# Patient Record
Sex: Female | Born: 1970 | Race: Black or African American | Hispanic: No | Marital: Married | Smoking: Never smoker
Health system: Southern US, Community
[De-identification: ages and names within clinical notes are randomized; demographics above are authoritative.]

## PROBLEM LIST (undated history)

## (undated) DIAGNOSIS — J302 Other seasonal allergic rhinitis: Secondary | ICD-10-CM

## (undated) DIAGNOSIS — D649 Anemia, unspecified: Secondary | ICD-10-CM

## (undated) DIAGNOSIS — F32A Depression, unspecified: Secondary | ICD-10-CM

## (undated) DIAGNOSIS — H269 Unspecified cataract: Secondary | ICD-10-CM

## (undated) DIAGNOSIS — E079 Disorder of thyroid, unspecified: Secondary | ICD-10-CM

## (undated) DIAGNOSIS — E785 Hyperlipidemia, unspecified: Secondary | ICD-10-CM

## (undated) DIAGNOSIS — F419 Anxiety disorder, unspecified: Secondary | ICD-10-CM

## (undated) DIAGNOSIS — F329 Major depressive disorder, single episode, unspecified: Secondary | ICD-10-CM

## (undated) DIAGNOSIS — I517 Cardiomegaly: Secondary | ICD-10-CM

## (undated) DIAGNOSIS — M1990 Unspecified osteoarthritis, unspecified site: Secondary | ICD-10-CM

## (undated) DIAGNOSIS — I10 Essential (primary) hypertension: Secondary | ICD-10-CM

## (undated) HISTORY — DX: Anemia, unspecified: D64.9

## (undated) HISTORY — DX: Depression, unspecified: F32.A

## (undated) HISTORY — PX: KNEE SURGERY: SHX244

## (undated) HISTORY — PX: ABDOMINAL HYSTERECTOMY: SHX81

## (undated) HISTORY — DX: Unspecified osteoarthritis, unspecified site: M19.90

## (undated) HISTORY — PX: GASTRIC BYPASS: SHX52

## (undated) HISTORY — DX: Hyperlipidemia, unspecified: E78.5

## (undated) HISTORY — DX: Unspecified cataract: H26.9

## (undated) HISTORY — DX: Major depressive disorder, single episode, unspecified: F32.9

## (undated) HISTORY — PX: HERNIA REPAIR: SHX51

## (undated) HISTORY — DX: Other seasonal allergic rhinitis: J30.2

## (undated) HISTORY — DX: Anxiety disorder, unspecified: F41.9

---

## 1993-01-29 HISTORY — PX: DILATION AND CURETTAGE OF UTERUS: SHX78

## 1997-01-29 HISTORY — PX: GASTRIC RESTRICTION SURGERY: SHX653

## 1998-01-29 HISTORY — PX: GASTRIC BYPASS: SHX52

## 1998-01-29 HISTORY — PX: VENTRAL HERNIA REPAIR: SHX424

## 2006-10-05 ENCOUNTER — Emergency Department (HOSPITAL_COMMUNITY): Admission: EM | Admit: 2006-10-05 | Discharge: 2006-10-05 | Payer: Self-pay | Admitting: Emergency Medicine

## 2006-11-26 ENCOUNTER — Emergency Department (HOSPITAL_COMMUNITY): Admission: EM | Admit: 2006-11-26 | Discharge: 2006-11-27 | Payer: Self-pay | Admitting: Emergency Medicine

## 2006-12-05 ENCOUNTER — Encounter (HOSPITAL_COMMUNITY): Admission: RE | Admit: 2006-12-05 | Discharge: 2007-01-04 | Payer: Self-pay | Admitting: Family Medicine

## 2007-01-01 ENCOUNTER — Ambulatory Visit (HOSPITAL_COMMUNITY): Admission: RE | Admit: 2007-01-01 | Discharge: 2007-01-01 | Payer: Self-pay | Admitting: Family Medicine

## 2007-01-01 ENCOUNTER — Encounter: Payer: Self-pay | Admitting: Orthopedic Surgery

## 2007-03-10 ENCOUNTER — Encounter (INDEPENDENT_AMBULATORY_CARE_PROVIDER_SITE_OTHER): Payer: Self-pay | Admitting: *Deleted

## 2007-03-10 ENCOUNTER — Ambulatory Visit (HOSPITAL_COMMUNITY): Admission: RE | Admit: 2007-03-10 | Discharge: 2007-03-10 | Payer: Self-pay | Admitting: Family Medicine

## 2007-08-19 ENCOUNTER — Ambulatory Visit: Payer: Self-pay | Admitting: Orthopedic Surgery

## 2007-08-19 DIAGNOSIS — M722 Plantar fascial fibromatosis: Secondary | ICD-10-CM | POA: Insufficient documentation

## 2007-09-08 ENCOUNTER — Telehealth: Payer: Self-pay | Admitting: Orthopedic Surgery

## 2007-09-10 ENCOUNTER — Ambulatory Visit: Payer: Self-pay | Admitting: Orthopedic Surgery

## 2007-10-14 ENCOUNTER — Telehealth: Payer: Self-pay | Admitting: Orthopedic Surgery

## 2007-12-10 ENCOUNTER — Ambulatory Visit: Payer: Self-pay | Admitting: Orthopedic Surgery

## 2007-12-10 DIAGNOSIS — M25569 Pain in unspecified knee: Secondary | ICD-10-CM | POA: Insufficient documentation

## 2008-01-12 ENCOUNTER — Ambulatory Visit: Payer: Self-pay | Admitting: Orthopedic Surgery

## 2008-01-12 DIAGNOSIS — M25469 Effusion, unspecified knee: Secondary | ICD-10-CM | POA: Insufficient documentation

## 2008-01-20 ENCOUNTER — Telehealth: Payer: Self-pay | Admitting: Orthopedic Surgery

## 2008-01-26 ENCOUNTER — Ambulatory Visit (HOSPITAL_COMMUNITY): Admission: RE | Admit: 2008-01-26 | Discharge: 2008-01-26 | Payer: Self-pay | Admitting: Orthopedic Surgery

## 2008-01-30 HISTORY — PX: KNEE SURGERY: SHX244

## 2008-03-31 ENCOUNTER — Ambulatory Visit: Payer: Self-pay | Admitting: Orthopedic Surgery

## 2008-03-31 ENCOUNTER — Emergency Department (HOSPITAL_COMMUNITY): Admission: EM | Admit: 2008-03-31 | Discharge: 2008-03-31 | Payer: Self-pay | Admitting: Emergency Medicine

## 2008-03-31 DIAGNOSIS — M545 Low back pain, unspecified: Secondary | ICD-10-CM | POA: Insufficient documentation

## 2008-03-31 DIAGNOSIS — M5126 Other intervertebral disc displacement, lumbar region: Secondary | ICD-10-CM | POA: Insufficient documentation

## 2008-04-02 ENCOUNTER — Telehealth: Payer: Self-pay | Admitting: Orthopedic Surgery

## 2008-04-02 ENCOUNTER — Ambulatory Visit (HOSPITAL_COMMUNITY): Admission: RE | Admit: 2008-04-02 | Discharge: 2008-04-02 | Payer: Self-pay | Admitting: Orthopedic Surgery

## 2008-04-05 ENCOUNTER — Encounter: Payer: Self-pay | Admitting: Orthopedic Surgery

## 2008-04-07 ENCOUNTER — Ambulatory Visit: Payer: Self-pay | Admitting: Orthopedic Surgery

## 2008-04-15 ENCOUNTER — Telehealth: Payer: Self-pay | Admitting: Orthopedic Surgery

## 2008-04-16 ENCOUNTER — Encounter (INDEPENDENT_AMBULATORY_CARE_PROVIDER_SITE_OTHER): Payer: Self-pay | Admitting: *Deleted

## 2008-04-19 ENCOUNTER — Encounter: Admission: RE | Admit: 2008-04-19 | Discharge: 2008-04-19 | Payer: Self-pay | Admitting: Orthopedic Surgery

## 2008-04-30 ENCOUNTER — Encounter: Payer: Self-pay | Admitting: Orthopedic Surgery

## 2008-04-30 ENCOUNTER — Emergency Department (HOSPITAL_COMMUNITY): Admission: EM | Admit: 2008-04-30 | Discharge: 2008-04-30 | Payer: Self-pay | Admitting: Emergency Medicine

## 2008-05-01 ENCOUNTER — Encounter: Payer: Self-pay | Admitting: Orthopedic Surgery

## 2008-05-03 ENCOUNTER — Ambulatory Visit: Payer: Self-pay | Admitting: Orthopedic Surgery

## 2008-05-03 DIAGNOSIS — S83419A Sprain of medial collateral ligament of unspecified knee, initial encounter: Secondary | ICD-10-CM | POA: Insufficient documentation

## 2008-05-04 ENCOUNTER — Encounter: Admission: RE | Admit: 2008-05-04 | Discharge: 2008-05-04 | Payer: Self-pay | Admitting: Orthopedic Surgery

## 2008-05-04 ENCOUNTER — Telehealth: Payer: Self-pay | Admitting: Orthopedic Surgery

## 2008-05-26 ENCOUNTER — Encounter: Admission: RE | Admit: 2008-05-26 | Discharge: 2008-05-26 | Payer: Self-pay | Admitting: Internal Medicine

## 2008-05-31 ENCOUNTER — Ambulatory Visit: Payer: Self-pay | Admitting: Orthopedic Surgery

## 2008-07-23 ENCOUNTER — Encounter: Payer: Self-pay | Admitting: Orthopedic Surgery

## 2008-07-26 ENCOUNTER — Telehealth: Payer: Self-pay | Admitting: Orthopedic Surgery

## 2008-07-29 ENCOUNTER — Encounter (INDEPENDENT_AMBULATORY_CARE_PROVIDER_SITE_OTHER): Payer: Self-pay | Admitting: *Deleted

## 2008-07-29 ENCOUNTER — Ambulatory Visit: Payer: Self-pay | Admitting: Orthopedic Surgery

## 2008-07-29 DIAGNOSIS — M23349 Other meniscus derangements, anterior horn of lateral meniscus, unspecified knee: Secondary | ICD-10-CM | POA: Insufficient documentation

## 2008-08-04 ENCOUNTER — Encounter (INDEPENDENT_AMBULATORY_CARE_PROVIDER_SITE_OTHER): Payer: Self-pay | Admitting: *Deleted

## 2008-08-19 ENCOUNTER — Telehealth: Payer: Self-pay | Admitting: Orthopedic Surgery

## 2008-09-21 ENCOUNTER — Telehealth: Payer: Self-pay | Admitting: Orthopedic Surgery

## 2008-09-30 ENCOUNTER — Ambulatory Visit: Payer: Self-pay | Admitting: Orthopedic Surgery

## 2008-09-30 DIAGNOSIS — G5600 Carpal tunnel syndrome, unspecified upper limb: Secondary | ICD-10-CM | POA: Insufficient documentation

## 2008-10-01 ENCOUNTER — Encounter (INDEPENDENT_AMBULATORY_CARE_PROVIDER_SITE_OTHER): Payer: Self-pay | Admitting: *Deleted

## 2008-10-20 ENCOUNTER — Encounter: Admission: RE | Admit: 2008-10-20 | Discharge: 2008-10-20 | Payer: Self-pay | Admitting: Orthopedic Surgery

## 2008-11-26 ENCOUNTER — Encounter: Admission: RE | Admit: 2008-11-26 | Discharge: 2008-11-26 | Payer: Self-pay | Admitting: Orthopedic Surgery

## 2008-12-01 ENCOUNTER — Ambulatory Visit: Payer: Self-pay | Admitting: Orthopedic Surgery

## 2008-12-01 DIAGNOSIS — S83259A Bucket-handle tear of lateral meniscus, current injury, unspecified knee, initial encounter: Secondary | ICD-10-CM | POA: Insufficient documentation

## 2008-12-01 DIAGNOSIS — S83289A Other tear of lateral meniscus, current injury, unspecified knee, initial encounter: Secondary | ICD-10-CM | POA: Insufficient documentation

## 2008-12-07 ENCOUNTER — Encounter: Payer: Self-pay | Admitting: Orthopedic Surgery

## 2008-12-20 ENCOUNTER — Encounter: Payer: Self-pay | Admitting: Orthopedic Surgery

## 2008-12-20 ENCOUNTER — Telehealth: Payer: Self-pay | Admitting: Orthopedic Surgery

## 2008-12-21 ENCOUNTER — Ambulatory Visit: Payer: Self-pay | Admitting: Orthopedic Surgery

## 2008-12-21 ENCOUNTER — Ambulatory Visit (HOSPITAL_COMMUNITY): Admission: RE | Admit: 2008-12-21 | Discharge: 2008-12-21 | Payer: Self-pay | Admitting: Orthopedic Surgery

## 2008-12-28 ENCOUNTER — Ambulatory Visit: Payer: Self-pay | Admitting: Orthopedic Surgery

## 2009-01-03 ENCOUNTER — Ambulatory Visit: Payer: Self-pay | Admitting: Orthopedic Surgery

## 2009-01-16 ENCOUNTER — Emergency Department (HOSPITAL_COMMUNITY): Admission: EM | Admit: 2009-01-16 | Discharge: 2009-01-16 | Payer: Self-pay | Admitting: Emergency Medicine

## 2009-01-16 ENCOUNTER — Encounter: Payer: Self-pay | Admitting: Orthopedic Surgery

## 2009-01-17 ENCOUNTER — Ambulatory Visit: Payer: Self-pay | Admitting: Orthopedic Surgery

## 2009-01-17 DIAGNOSIS — S92109A Unspecified fracture of unspecified talus, initial encounter for closed fracture: Secondary | ICD-10-CM | POA: Insufficient documentation

## 2009-01-25 ENCOUNTER — Encounter: Admission: RE | Admit: 2009-01-25 | Discharge: 2009-01-25 | Payer: Self-pay | Admitting: Orthopedic Surgery

## 2009-03-20 ENCOUNTER — Emergency Department (HOSPITAL_COMMUNITY): Admission: EM | Admit: 2009-03-20 | Discharge: 2009-03-20 | Payer: Self-pay | Admitting: Emergency Medicine

## 2009-04-15 ENCOUNTER — Encounter: Payer: Self-pay | Admitting: Orthopedic Surgery

## 2009-04-25 ENCOUNTER — Telehealth: Payer: Self-pay | Admitting: Orthopedic Surgery

## 2009-04-26 ENCOUNTER — Telehealth: Payer: Self-pay | Admitting: Orthopedic Surgery

## 2009-04-27 ENCOUNTER — Telehealth: Payer: Self-pay | Admitting: Orthopedic Surgery

## 2009-04-28 ENCOUNTER — Encounter: Admission: RE | Admit: 2009-04-28 | Discharge: 2009-04-28 | Payer: Self-pay | Admitting: Orthopedic Surgery

## 2009-06-23 ENCOUNTER — Telehealth: Payer: Self-pay | Admitting: Orthopedic Surgery

## 2009-08-09 ENCOUNTER — Encounter: Admission: RE | Admit: 2009-08-09 | Discharge: 2009-08-09 | Payer: Self-pay | Admitting: Orthopedic Surgery

## 2009-09-23 ENCOUNTER — Encounter: Admission: RE | Admit: 2009-09-23 | Discharge: 2009-09-23 | Payer: Self-pay | Admitting: Orthopedic Surgery

## 2009-10-28 ENCOUNTER — Telehealth: Payer: Self-pay | Admitting: Orthopedic Surgery

## 2010-01-03 ENCOUNTER — Emergency Department (HOSPITAL_COMMUNITY)
Admission: EM | Admit: 2010-01-03 | Discharge: 2010-01-04 | Payer: Self-pay | Source: Home / Self Care | Admitting: Emergency Medicine

## 2010-01-06 ENCOUNTER — Encounter: Payer: Self-pay | Admitting: Orthopedic Surgery

## 2010-01-19 ENCOUNTER — Emergency Department (HOSPITAL_COMMUNITY)
Admission: EM | Admit: 2010-01-19 | Discharge: 2010-01-19 | Payer: Self-pay | Source: Home / Self Care | Admitting: Emergency Medicine

## 2010-01-19 ENCOUNTER — Encounter: Payer: Self-pay | Admitting: Orthopedic Surgery

## 2010-01-25 ENCOUNTER — Encounter: Payer: Self-pay | Admitting: Orthopedic Surgery

## 2010-01-29 HISTORY — PX: BREAST BIOPSY: SHX20

## 2010-01-29 HISTORY — PX: KNEE SURGERY: SHX244

## 2010-02-01 ENCOUNTER — Encounter: Payer: Self-pay | Admitting: Gastroenterology

## 2010-02-01 ENCOUNTER — Encounter (INDEPENDENT_AMBULATORY_CARE_PROVIDER_SITE_OTHER): Payer: Self-pay | Admitting: *Deleted

## 2010-02-02 DIAGNOSIS — K5900 Constipation, unspecified: Secondary | ICD-10-CM | POA: Insufficient documentation

## 2010-02-02 DIAGNOSIS — E559 Vitamin D deficiency, unspecified: Secondary | ICD-10-CM | POA: Insufficient documentation

## 2010-02-02 DIAGNOSIS — F3289 Other specified depressive episodes: Secondary | ICD-10-CM | POA: Insufficient documentation

## 2010-02-02 DIAGNOSIS — K5909 Other constipation: Secondary | ICD-10-CM | POA: Insufficient documentation

## 2010-02-02 DIAGNOSIS — K219 Gastro-esophageal reflux disease without esophagitis: Secondary | ICD-10-CM | POA: Insufficient documentation

## 2010-02-02 DIAGNOSIS — G894 Chronic pain syndrome: Secondary | ICD-10-CM | POA: Insufficient documentation

## 2010-02-02 DIAGNOSIS — A4902 Methicillin resistant Staphylococcus aureus infection, unspecified site: Secondary | ICD-10-CM | POA: Insufficient documentation

## 2010-02-02 DIAGNOSIS — F329 Major depressive disorder, single episode, unspecified: Secondary | ICD-10-CM | POA: Insufficient documentation

## 2010-02-02 DIAGNOSIS — E669 Obesity, unspecified: Secondary | ICD-10-CM | POA: Insufficient documentation

## 2010-02-02 DIAGNOSIS — E119 Type 2 diabetes mellitus without complications: Secondary | ICD-10-CM | POA: Insufficient documentation

## 2010-02-02 DIAGNOSIS — E039 Hypothyroidism, unspecified: Secondary | ICD-10-CM | POA: Insufficient documentation

## 2010-02-07 ENCOUNTER — Other Ambulatory Visit: Payer: Self-pay | Admitting: Gastroenterology

## 2010-02-07 ENCOUNTER — Encounter: Payer: Self-pay | Admitting: Orthopedic Surgery

## 2010-02-07 ENCOUNTER — Encounter: Payer: Self-pay | Admitting: Gastroenterology

## 2010-02-07 ENCOUNTER — Ambulatory Visit
Admission: RE | Admit: 2010-02-07 | Discharge: 2010-02-07 | Payer: Self-pay | Source: Home / Self Care | Attending: Orthopedic Surgery | Admitting: Orthopedic Surgery

## 2010-02-07 ENCOUNTER — Ambulatory Visit
Admission: RE | Admit: 2010-02-07 | Discharge: 2010-02-07 | Payer: Self-pay | Source: Home / Self Care | Attending: Gastroenterology | Admitting: Gastroenterology

## 2010-02-07 DIAGNOSIS — IMO0002 Reserved for concepts with insufficient information to code with codable children: Secondary | ICD-10-CM | POA: Insufficient documentation

## 2010-02-07 DIAGNOSIS — K5900 Constipation, unspecified: Secondary | ICD-10-CM | POA: Insufficient documentation

## 2010-02-07 DIAGNOSIS — R51 Headache: Secondary | ICD-10-CM | POA: Insufficient documentation

## 2010-02-07 DIAGNOSIS — D649 Anemia, unspecified: Secondary | ICD-10-CM | POA: Insufficient documentation

## 2010-02-07 DIAGNOSIS — K3189 Other diseases of stomach and duodenum: Secondary | ICD-10-CM | POA: Insufficient documentation

## 2010-02-07 DIAGNOSIS — K625 Hemorrhage of anus and rectum: Secondary | ICD-10-CM | POA: Insufficient documentation

## 2010-02-07 DIAGNOSIS — M1710 Unilateral primary osteoarthritis, unspecified knee: Secondary | ICD-10-CM | POA: Insufficient documentation

## 2010-02-07 DIAGNOSIS — K9589 Other complications of other bariatric procedure: Secondary | ICD-10-CM | POA: Insufficient documentation

## 2010-02-07 DIAGNOSIS — M129 Arthropathy, unspecified: Secondary | ICD-10-CM | POA: Insufficient documentation

## 2010-02-07 DIAGNOSIS — R1013 Epigastric pain: Secondary | ICD-10-CM

## 2010-02-07 DIAGNOSIS — R519 Headache, unspecified: Secondary | ICD-10-CM | POA: Insufficient documentation

## 2010-02-07 DIAGNOSIS — IMO0001 Reserved for inherently not codable concepts without codable children: Secondary | ICD-10-CM | POA: Insufficient documentation

## 2010-02-07 DIAGNOSIS — R1012 Left upper quadrant pain: Secondary | ICD-10-CM | POA: Insufficient documentation

## 2010-02-07 DIAGNOSIS — R112 Nausea with vomiting, unspecified: Secondary | ICD-10-CM | POA: Insufficient documentation

## 2010-02-07 LAB — HEPATIC FUNCTION PANEL
ALT: 13 U/L (ref 0–35)
AST: 20 U/L (ref 0–37)
Albumin: 3.5 g/dL (ref 3.5–5.2)
Alkaline Phosphatase: 77 U/L (ref 39–117)
Bilirubin, Direct: 0.1 mg/dL (ref 0.0–0.3)
Total Bilirubin: 0.3 mg/dL (ref 0.3–1.2)
Total Protein: 7.4 g/dL (ref 6.0–8.3)

## 2010-02-07 LAB — BASIC METABOLIC PANEL
BUN: 10 mg/dL (ref 6–23)
CO2: 29 mEq/L (ref 19–32)
Calcium: 9.2 mg/dL (ref 8.4–10.5)
Chloride: 102 mEq/L (ref 96–112)
Creatinine, Ser: 0.8 mg/dL (ref 0.4–1.2)
GFR: 102.4 mL/min (ref >60.00)
Glucose, Bld: 128 mg/dL — ABNORMAL HIGH (ref 70–99)
Potassium: 4.5 mEq/L (ref 3.5–5.1)
Sodium: 140 mEq/L (ref 135–145)

## 2010-02-07 LAB — VITAMIN B12: Vitamin B-12: 650 pg/mL (ref 211–911)

## 2010-02-07 LAB — CBC WITH DIFFERENTIAL/PLATELET
Basophils Absolute: 0.1 10*3/uL (ref 0.0–0.1)
Basophils Relative: 0.9 % (ref 0.0–3.0)
Eosinophils Absolute: 0.3 10*3/uL (ref 0.0–0.7)
Eosinophils Relative: 5.1 % — ABNORMAL HIGH (ref 0.0–5.0)
HCT: 34.4 % — ABNORMAL LOW (ref 36.0–46.0)
Hemoglobin: 11.2 g/dL — ABNORMAL LOW (ref 12.0–15.0)
Lymphocytes Relative: 41.6 % (ref 12.0–46.0)
Lymphs Abs: 2.8 10*3/uL (ref 0.7–4.0)
MCHC: 32.7 g/dL (ref 30.0–36.0)
MCV: 81 fl (ref 78.0–100.0)
Monocytes Absolute: 0.4 10*3/uL (ref 0.1–1.0)
Monocytes Relative: 5.5 % (ref 3.0–12.0)
Neutro Abs: 3.2 10*3/uL (ref 1.4–7.7)
Neutrophils Relative %: 46.9 % (ref 43.0–77.0)
Platelets: 306 10*3/uL (ref 150.0–400.0)
RBC: 4.25 Mil/uL (ref 3.87–5.11)
RDW: 15.9 % — ABNORMAL HIGH (ref 11.5–14.6)
WBC: 6.8 10*3/uL (ref 4.5–10.5)

## 2010-02-07 LAB — MAGNESIUM: Magnesium: 2.2 mg/dL (ref 1.5–2.5)

## 2010-02-07 LAB — FERRITIN: Ferritin: 6.7 ng/mL — ABNORMAL LOW (ref 10.0–291.0)

## 2010-02-07 LAB — LIPASE: Lipase: 25 U/L (ref 11.0–59.0)

## 2010-02-07 LAB — AMYLASE: Amylase: 78 U/L (ref 27–131)

## 2010-02-07 LAB — IBC PANEL
Iron: 34 ug/dL — ABNORMAL LOW (ref 42–145)
Saturation Ratios: 6.8 % — ABNORMAL LOW (ref 20.0–50.0)
Transferrin: 358 mg/dL (ref 212.0–360.0)

## 2010-02-07 LAB — IGA: IgA: 283 mg/dL (ref 68–378)

## 2010-02-07 LAB — CONVERTED CEMR LAB: Tissue Transglutaminase Ab, IgA: 4.1 U (ref <20)

## 2010-02-07 LAB — TSH: TSH: 3.34 u[IU]/mL (ref 0.35–5.50)

## 2010-02-07 LAB — SEDIMENTATION RATE: Sed Rate: 39 mm/hr — ABNORMAL HIGH (ref 0–22)

## 2010-02-07 LAB — FOLATE: Folate: 12.2 ng/mL

## 2010-02-10 ENCOUNTER — Encounter (HOSPITAL_COMMUNITY)
Admission: RE | Admit: 2010-02-10 | Discharge: 2010-02-28 | Payer: Self-pay | Source: Home / Self Care | Attending: Anesthesiology | Admitting: Anesthesiology

## 2010-02-14 ENCOUNTER — Encounter: Payer: Self-pay | Admitting: Orthopedic Surgery

## 2010-02-16 ENCOUNTER — Ambulatory Visit: Admit: 2010-02-16 | Payer: Self-pay | Admitting: Orthopedic Surgery

## 2010-02-17 ENCOUNTER — Telehealth: Payer: Self-pay | Admitting: Orthopedic Surgery

## 2010-02-19 ENCOUNTER — Encounter: Payer: Self-pay | Admitting: Family Medicine

## 2010-02-19 ENCOUNTER — Encounter: Payer: Self-pay | Admitting: Orthopedic Surgery

## 2010-02-21 ENCOUNTER — Telehealth: Payer: Self-pay | Admitting: Orthopedic Surgery

## 2010-02-22 ENCOUNTER — Telehealth: Payer: Self-pay | Admitting: Orthopedic Surgery

## 2010-02-23 ENCOUNTER — Ambulatory Visit
Admission: RE | Admit: 2010-02-23 | Discharge: 2010-02-23 | Payer: Self-pay | Source: Home / Self Care | Attending: Gastroenterology | Admitting: Gastroenterology

## 2010-02-23 ENCOUNTER — Encounter (INDEPENDENT_AMBULATORY_CARE_PROVIDER_SITE_OTHER): Payer: Self-pay | Admitting: *Deleted

## 2010-02-24 ENCOUNTER — Encounter: Payer: Self-pay | Admitting: Gastroenterology

## 2010-02-24 ENCOUNTER — Encounter
Admission: RE | Admit: 2010-02-24 | Discharge: 2010-02-24 | Payer: Self-pay | Source: Home / Self Care | Attending: Orthopedic Surgery | Admitting: Orthopedic Surgery

## 2010-02-24 ENCOUNTER — Encounter (INDEPENDENT_AMBULATORY_CARE_PROVIDER_SITE_OTHER): Payer: Self-pay | Admitting: *Deleted

## 2010-02-28 ENCOUNTER — Ambulatory Visit (HOSPITAL_COMMUNITY)
Admission: RE | Admit: 2010-02-28 | Discharge: 2010-02-28 | Payer: Self-pay | Source: Home / Self Care | Attending: Orthopedic Surgery | Admitting: Orthopedic Surgery

## 2010-02-28 NOTE — Progress Notes (Signed)
 Summary: question about nabumetone  Phone Note Call from Patient   Summary of Call: Summer Lopez (01-14-2071)  is having black  stools for last 5 days.  She thinks this is coming from taking the nabumetone as this is listed as a side effect.  She stopped taking this medicine last Friday (09/05/07). Can you prescribe another medicine for the plantar facitious or can she get an injection.  Uses [facility] in [place].  She can be reached at work until 4:00 at [phone]  or after 4:00 at home [phone] Initial call taken by: March Service,  September 08, 2007 11:56 AM  Follow-up for Phone Call        STOP MED   COME IN FOR INJECTION THIS WEEK  Follow-up by: Elsa Halls MD,  September 08, 2007 12:09 PM  Additional Follow-up for Phone Call Additional follow up Details #1::        PATIENT TO COME IN FOR INJECTION 09/10/07 Additional Follow-up by: March Service,  September 08, 2007 1:38 PM

## 2010-02-28 NOTE — Assessment & Plan Note (Signed)
 Summary: BI CARPAL TUNNELL/STA BCBS/BSF   Visit Type:  new problem  CC:  numbness in the hands .  History of Present Illness: I saw Summer Lopez in the office today for a followup visit.  She is a 40 years old woman with the complaint of:  chief complaint:  bilateral hand pain and numbness at night.  pain -duration 2 weeks, no injury. -location pain in palm area,  numbness in all fingers. -severity [1-10] 7 at worse -worsened by: sleep, using cane. -improved by:  massage. -xrays done & where:  none  On Mobic now and Norco 5, no relief.  No NCS, no neurontin  or B6.  No wrist splints.   Current Medications (verified): 1)  Synthroid  112 Mcg  Tabs (Levothyroxine  Sodium) 2)  Januvia 100 Mg  Tabs (Sitagliptin Phosphate) 3)  Zoloft 50 Mg  Tabs (Sertraline Hcl) 4)  Lantus  Solostar 100 Unit/ml Soln (Insulin  Glargine) 5)  Celexa 40 Mg Tabs (Citalopram Hydrobromide) 6)  Vitamin D 50000 Unit Caps (Ergocalciferol) 7)  Melatonin 5 Mg Tabs (Melatonin) 8)  Tramadol Hcl 50 Mg Tabs (Tramadol Hcl) 9)  Tylenol  Pm Extra Strength 1000-50 Mg/30ml Liqd (Diphenhydramine-Apap (Sleep)) 10)  Norco 5-325 Mg Tabs (Hydrocodone -Acetaminophen ) .... One By Mouth Q 4 Hrs As Needed Pain 11)  Mobic 15 Mg Tabs (Meloxicam) .... One By Mouth Qd 12)  Neurontin  100 Mg Caps (Gabapentin ) .Aaron Aas.. 1 At Bedtime  Allergies (verified): 1)  ! * Mobic 2)  ! Morphine  Past History:  Past medical, surgical, family and social histories (including risk factors) reviewed, and no changes noted (except as noted below).  Past Medical History: Reviewed history from 08/19/2007 and no changes required. diabetic thyroid ulcer history reflux depression  Past Surgical History: Reviewed history from 03/31/2008 and no changes required. partial hysterectomy gastric bypass gastric band  Family History: Reviewed history from 08/19/2007 and no changes required. Family History of Diabetes  Social History: Reviewed history  from 08/19/2007 and no changes required. Patient is married.  financial/education  Review of Systems General:  Denies weight loss, weight gain, fever, chills, and fatigue. Neuro:  See HPI. MS:  See HPI.  Physical Exam  Msk:  Have a mildly obese well-developed well-groomed female who is in no acute distress  She is a pleasant mood she's awake alert and oriented x3  Upper extremities have normal pulse and perfusion with no swelling or edema  She has decreased sensation in the median nerve distribution of both upper extremities primarily in the index and long finger and somewhat in the thumb.  There no skin lesions  Her lymph nodes are normal in the axilla of both shoulders.  Gait has no bearing on this exam  On inspection there is no swelling of the hands, there is tenderness over the distal radius volarly primarily over the flexor tendons.  The carpal tunnel is somewhat tender.  Range of motion is normal grip strength is normal and the wrists are stable  Provocative tests included positive Phalen's at 15 seconds bilaterally.  Positive Tinel's bilaterally.     Impression & Recommendations:  Problem # 1:  CARPAL TUNNEL SYNDROME (ICD-354.0) Assessment New  initial treatment instituted for carpal tunnel syndrome including Neurontin , vitamin B6 and nighttime splinting.  She wanted to start some water aerobics to help with weight loss as she does have torn cartilage in the knee and is waiting to have surgery.  She wants to wait until she loses some more weight  Orders: Est. Patient Level IV (01601)  Medications Added to Medication List This Visit: 1)  Mobic 15 Mg Tabs (Meloxicam) .... One by mouth qd 2)  Neurontin  100 Mg Caps (Gabapentin ) .Aaron Aas.. 1 at bedtime  Other Orders: Misc. Referral (Misc. Ref)  Patient Instructions: 1)  Take water aerobics 3 x a week 2)  B6 100 mg two times a day for 6 weeks  3)  split at night  4)  neurontin  take at night  5)  ESI  L4-5. Prescriptions: NEURONTIN  100 MG CAPS (GABAPENTIN ) 1 at bedtime  #30 x 2   Entered and Authorized by:   Elsa Halls MD   Signed by:   Elsa Halls MD on 09/30/2008   Method used:   Print then Give to Patient   RxID:   [id number]

## 2010-02-28 NOTE — Assessment & Plan Note (Signed)
 Summary: rt knee popped/pain/swollen/bcbs/bsf   Visit Type:  follow up   CC:  right knee pain.  History of Present Illness: I saw Summer Lopez in the office today for a followup visit.  She is a 40 years old woman with the complaint of:  NEW PROBLEM. RIGHT KNEE.  Right knee popped this Am, while she was walking. Twisting injury. Came in as a walk in    Patient states that her knee poppped and she could not put any pressure on it.  Has h/o lateral meniscus tear [MRI-done this year]   C/O severe non rad medial knee pain and loss of motion and ability to bear weight       Current Medications (verified): 1)  Synthroid  112 Mcg  Tabs (Levothyroxine  Sodium) 2)  Januvia 100 Mg  Tabs (Sitagliptin Phosphate) 3)  Zoloft 50 Mg  Tabs (Sertraline Hcl) 4)  Lantus  Solostar 100 Unit/ml Soln (Insulin  Glargine) 5)  Celexa 40 Mg Tabs (Citalopram Hydrobromide) 6)  Vitamin D 50000 Unit Caps (Ergocalciferol) 7)  Melatonin 5 Mg Tabs (Melatonin) 8)  Tramadol Hcl 50 Mg Tabs (Tramadol Hcl) 9)  Tylenol  Pm Extra Strength 1000-50 Mg/49ml Liqd (Diphenhydramine-Apap (Sleep)) 10)  Neurontin  100 Mg Caps (Gabapentin ) .Aaron Aas.. 1 Today, 1 Q12 Tomorrow, 1 Q 8 Hrs Friday and Continue 11)  Lorcet Plus 7.5-650 Mg Tabs (Hydrocodone -Acetaminophen ) .Aaron Aas.. 1 By Mouth Q 4 As Needed 12)  Percocet 5-325 Mg Tabs (Oxycodone -Acetaminophen ) .... One By Mouth Q 4 Hrs As Needed Pain 13)  Robaxin  500 Mg Tabs (Methocarbamol ) .Aaron Aas.. 1 By Mouth Q 6 As Needed  Allergies (verified): 1)  ! * Mobic 2)  ! Morphine  Past History:  Family History: Last updated: 08/19/2007 Family History of Diabetes  Social History: Last updated: 08/19/2007 Patient is married.  financial/education  Risk Factors: Caffeine Use: 2 (08/19/2007)  Risk Factors: Smoking Status: never (08/19/2007)  Past medical, surgical, family and social histories (including risk factors) reviewed, and no changes noted (except as noted below).  Past Medical  History: Reviewed history from 08/19/2007 and no changes required. diabetic thyroid ulcer history reflux depression  Past Surgical History: Reviewed history from 03/31/2008 and no changes required. partial hysterectomy gastric bypass gastric band  Family History: Reviewed history from 08/19/2007 and no changes required. Family History of Diabetes  Social History: Reviewed history from 08/19/2007 and no changes required. Patient is married.  financial/education  Review of Systems MS:  Complains of joint pain and joint swelling; denies rheumatoid arthritis, gout, bone cancer, osteoporosis, and .  The review of systems is negative for General, Cardiac , Resp, GI, GU, Neuro, Endo, Psych, Derm, EENT, Immunology, and Lymphatic.  Physical Exam  Additional Exam:  GEN: normal appearance and no deformities, obesity  CDV: normal pulse and perfusion to the right lower extremity  SKIN: no rashes, pustules or cafe-au-lait spots NEURO: sensory responses were normal MSK: gait: crutches  Spine:n/a UE's were normally aligned with normal ROM, Strength, stability and alignment  RLE's: medial tenderness, no swelling. The knee has ROM = 70  Knee was stable     Impression & Recommendations:  Problem # 1:  DERANGEMENT OF ANTERIOR HORN OF LATERAL MENISCUS (ICD-717.42) Assessment Deteriorated  Probable exacerbation of previous lateral meniscus tear vs new tear   Assessment:  Rest, ice, Percocet for pain. If no improvemnet then scope   Orders: Est. Patient Level IV (32202)  Patient Instructions: 1)  f/u Tues  2)  Weekend: 3)  ice cruthces medication

## 2010-02-28 NOTE — Letter (Signed)
 Summary: Out of Work  [facility]  [address]  [place], [place] [address]   Phone: [phone]  Fax: [phone]    May 03, 2008   Employee:  Summer Lopez    To Whom It May Concern:   For Medical reasons, please excuse the above named employee from work for the following dates:  Start:   May 03, 2008  End:   May 10, 2008  Return to work:   May 10, 2008  If you need additional information, please feel free to contact our office.         Sincerely,    Author Legato, MD

## 2010-02-28 NOTE — Letter (Signed)
Summary: *Orthopedic No Show Letter  [facility]  [address] [place]  [place], [place] [address]   Phone: [phone]  Fax: [phone]      04/15/2009    Scout Guyett [address] [place], [place]  [address]    Dear Ms. Lager,   Our records indicate that you missed your scheduled appointment with Dr. Laqueta Jean on 04/11/09.  Please contact this office to reschedule your appointment as soon as possible.  It is important that you keep your scheduled appointments with your physician, so we can provide you the best care possible.  We have enclosed an appointment card for your convenience.      Sincerely,     Dr. Demetrius Revel, MD [facility] Phone [phone]

## 2010-02-28 NOTE — Assessment & Plan Note (Signed)
 Summary: [facility] ER FOL/UP FRT FOOT PRE-CK RT KNEE FX/XR [facility] 01/16/09/POST ...   Visit Type:  Follow-up, postop visit and new problem visit.  CC:  new RIGHT foot pain and new LEFT foot pain.  History of Present Illness: I saw Summer Lopez in the office today for a followup visit.  She is a 40 years old woman with the complaint of:  chief complaint:  fracture right foot, er.  pain -duration 01/15/09 fell at church, her foot went backwards as she fell on the ice, landed on her bottom  -location right foot on top, lateral foot.  -severity [1-10]   8  -worsened by: walking   -improved by:  Norco 5mg  number 20 given from er, takes Lorcet 10 for the knee postop, the Norco 5 does not help, ran out of Lorcet 10. Has not uses ice or Ibuprofen . Has boot on from er, helps.  -context:  achy pain  -other symptoms had swelling no bruising.  -xrays done & where:  [facility] right foot 01/16/09.  ROS has left heel pain also, bone spur, Rise Cheng she thinks is a bone spur with similar pain related to the pain. She had her RIGHT foot. However, this pain is more medial. It is constant during the day does exacerbated by ambulation, not relieved. It is not the actual plantar aspect, but at the junction of pigmented versus nonpigmented skin.     Allergies: 1)  ! * Mobic 2)  ! Morphine  Physical Exam  Additional Exam:  The patient is well developed and nourished, with normal grooming and hygiene. The body habitus is obese  RIGHT foot, ankle.  Dorsal tenderness and swelling of the RIGHT ankle and foot. The passive range of motion is normal, but painful.  There is stable. Stability test. Anterior posterior, and inversion and eversion. There is normal. Plantar flexion dorsiflexion power. Neurovascular exam and the foot is normal.  Ambulation is abnormal, using crutches, and a range of motion walker on the RIGHT.  On the LEFT foot. There is tenderness and swelling of the posterior tibial tendon with tenderness  at the junction of normal pigmented and unpigmented skin. The plantar aspect is somewhat tender, but not as much. The foot is flat. Ankle motion is normal. Strength in plantar flexion dorsiflexion is normal. Neurovascular exam is normal.     Impression & Recommendations:  Problem # 1:  CLOSED FRACTURE OF ASTRAGALUS (ICD-825.21) Assessment New  this is essentially an avulsion fracture of the dorsum of the tails of the small consistent with a sprain of the foot and ankle.  Recommend short Cam Walker-use CAM walker obtained from the ER  Orders: Est. Patient Level III (54270)  Problem # 2:  PLANTAR FACIITIS (ICD-728.71) Assessment: New  recommend Cam Walker. New Cam Walker was given for the LEFT foot  Orders: Est. Patient Level III (62376) Joint Aspirate / Injection, Intermediate (20605) Depo- Medrol  40mg  (J1030)  Problem # 3:  TEAR LATERAL MENISCUS (ICD-836.1) Assessment: Improved  Orders: Post-Op Check (28315)  Medications Added to Medication List This Visit: 1)  Norco 5-325 Mg Tabs (Hydrocodone -acetaminophen ) .... One by mouth q 4 hrs as needed pain 2)  Robaxin  500 Mg Tabs (Methocarbamol ) .Aaron Aas.. 1 by mouth q 6 as needed, pain back 3)  Norco 7.5-325 Mg Tabs (Hydrocodone -acetaminophen ) .Aaron Aas.. 1 by mouth q 4 as needed pain  Patient Instructions: 1)  You have received an injection of cortisone today. You may experience increased pain at the injection site. Apply ice pack to  the area for 20 minutes every 2 hours and take 2 xtra strength tylenol  every 8 hours. This increased pain will usually resolve in 24 hours. The injection will take effect in 3-10 days.  2)  brace on the right fracture foot  3)  brace on the left [plantar fasciitis 4)  return in 4 weeks  Prescriptions: NORCO 7.5-325 MG TABS (HYDROCODONE -ACETAMINOPHEN ) 1 by mouth q 4 as needed pain  #90 x 5   Entered and Authorized by:   Elsa Halls MD   Signed by:   Elsa Halls MD on 01/17/2009   Method used:   Print  then Give to Patient   RxID:   [id number] ROBAXIN  500 MG TABS (METHOCARBAMOL ) 1 by mouth q 6 as needed, pain back  #120 x 0   Entered and Authorized by:   Elsa Halls MD   Signed by:   Elsa Halls MD on 01/17/2009   Method used:   Print then Give to Patient   RxID:   [id number] NORCO 5-325 MG TABS (HYDROCODONE -ACETAMINOPHEN ) one by mouth q 4 hrs as needed pain  #20 x 0   Entered and Authorized by:   Elsa Halls MD   Signed by:   Elsa Halls MD on 01/17/2009   Method used:   Historical   RxID:   [id number]

## 2010-02-28 NOTE — Letter (Signed)
 Summary: Out of Work  [facility]  [address]  [place], [place] [address]   Phone: [phone]  Fax: [phone]    December 07, 2008   Employee:  Summer Lopez    To Whom It May Concern:   For Medical/surgical reasons, please excuse the above named employee from work for the following dates:  Start:   December 21, 2008  End/Estimated return to work date:  January 12, 2009     If you need additional information, please feel free to contact our office.         Sincerely,    Author Legato, MD

## 2010-02-28 NOTE — Letter (Signed)
 Summary: Work [facility]  [address]  [place], [place] [address]   Phone: [phone]  Fax: [phone]     Today's Date: April 05, 2008    Name of Patient: Summer Lopez  The above named patient's last medical office visit 03/31/2008.  Please take this into consideration when reviewing the time away from work/school.    Special Instructions:  [  ] None  [  ] To be off the remainder of today, returning to the normal work / school schedule tomorrow.  [  ] To be off until the next scheduled appointment on ______________________.  [ X] Other _____ Patient may return to work 1/2 days as follows: Tuesday 04/06/2008   sitting only.  May resume work:  04/08/08  Next scheduled appointment:  3/11/10____________________  Sincerely yours,   Author Legato, MD

## 2010-02-28 NOTE — Progress Notes (Signed)
 Summary: Pt called cancelled appt  Phone Note Call from Patient   Caller: Patient Summary of Call: Pt called in to cancel her fol up appt for tomorrow 10/15/07; states prefers to call back at a later date to reschedule Initial call taken by: Barnabas Booth,  October 14, 2007 12:49 PM

## 2010-02-28 NOTE — Progress Notes (Signed)
 Summary: MRI appointment.  Phone Note Outgoing Call   Call placed by: Jeraldine Molt,  April 02, 2008 11:13 AM Call placed to: Patient Action Taken: Phone Call Completed, Appt scheduled Summary of Call: I called to give the patient her MRI appointment at [facility] on 04-02-08 at 3:30. Patient has BCBS, authorization [phone]. Patient will follow up here to go over results.

## 2010-02-28 NOTE — Letter (Signed)
 Summary: Authorization to release information  Authorization to release information   Imported By: Barnabas Booth 01/26/2009 09:22:50  _____________________________________________________________________  External Attachment:    Type:   Image     Comment:   External Document

## 2010-02-28 NOTE — Assessment & Plan Note (Signed)
 Summary: [facility] ER F/UP,S/P FALL ONTO KNEES/04/30/08,XR [facility]/BCBS/CAF   History of Present Illness: I saw Summer Lopez in the office today for a followup visit.  She is a 40 years old woman with the complaint of:  bilateral knee pain after fall on 04/30/08.  Xrays [facility] 04/30/08.  She fell in the bathroom disorder did a split does not remember slipping on a wet floor.  Both legs went out from under her in a W. fashion.  She went to urgent care on April 2 into the hospital emergency room.  She was started on some crutches.  She try to get a brace but it didn't fit her knee.  She has bilateral medial knee pain.  She has a history of lateral meniscal tear in the RIGHT knee did not want surgery at that time we diagnosed.  bathroom, did not slip, does not know why she fell.  medication: Percocet, posterior distally.  Causes some disorientation as well.  Allergies: 1)  ! * Mobic 2)  ! Morphine   Knee Exam  General:    she is morbidly obese.  Her grooming is excellent.  Gait:    she is walking with crutches full weightbearing  Skin:    skin in both lower extremities normal  Inspection:     No deformity, ecchymosis or swelling.   Palpation:    there is tenderness over both medial collateral ligaments with increased laxity on the RIGHT knee versus the LEFT to valgus stress  Vascular:    There was no swelling or varicose veins. The pulses and temperature are normal. There was no edema or tenderness.  Sensory:    Gross coordination and sensation were normal.    Motor:    Motor strength 5/5 bilaterally for quadriceps, hamstrings, ankle dorsiflexion, and ankle plantar flexion.    Reflexes:    Normal and symmetric patellar and Achilles reflexes bilaterally.    Knee Exam:    Right:    Inspection:  Abnormal    Palpation:  Abnormal    Stability:  stable    Tenderness:  medial collateral    Range of Motion:       Flexion-Passive: 115 degrees    Left:    Inspection:  Abnormal    Palpation:   Abnormal    Stability:  stable    Tenderness:  medial collateral    Range of Motion:       Flexion-Passive: 110 degrees  MCL:    Right positive; Left negative  RIGHT knee grade 1 MCL sprain LEFT knee grade 1 MCL   Impression & Recommendations:  Problem # 1:  TEAR M C L (ICD-844.1) Assessment New The x-rays were done at [facility]. The report and the films have been reviewed. negative x-rays of both knees Assessment:  Bilateral MCL sprains should do well with hinge brace, and brace ordered based on patient's leg size / would not fit any of our brace  Orders: Est. Patient Level IV (55732)  Medications Added to Medication List This Visit: 1)  Percocet 5-325 Mg Tabs (Oxycodone -acetaminophen ) .... One by mouth q 4 hrs as needed pain  Patient Instructions: 1)  Recommended remaining out of work; out of work note provided. 2)  this week 3)  we'll call her with the new braces  Prescriptions: LORCET PLUS 7.5-650 MG TABS (HYDROCODONE -ACETAMINOPHEN ) 1 by mouth q 4 as needed  #60 x 1   Entered and Authorized by:   Elsa Halls MD   Signed by:   Arvel Lather  Phyllis Breeze MD on 05/03/2008   Method used:   Print then Give to Patient   RxID:   [id number]

## 2010-02-28 NOTE — Miscellaneous (Signed)
 Summary: faxed new rx for ESI's L4-5  Clinical Lists Changes  Appended Document: faxed new rx for ESI's L4-5 to [facility]

## 2010-02-28 NOTE — Progress Notes (Signed)
Summary: wants order for [facility]  Phone Note Call from Patient   Summary of Call: Baileigh Modisette ([birth year]) left a message asking for another order to get ESI injections Her # [phone] Initial call taken by: Ruffin Pyo,  April 25, 2009 1:56 PM  Follow-up for Phone Call        L4-L5 injection series Follow-up by: Arther Abbott MD,  April 25, 2009 2:00 PM

## 2010-02-28 NOTE — Miscellaneous (Signed)
 Summary: PT Referral form  PT Referral form   Imported By: March Service 07/23/2008 08:48:50  _____________________________________________________________________  External Attachment:    Type:   Image     Comment:   External Document
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
## 2010-02-28 NOTE — Progress Notes (Signed)
 Summary: wants ESI  Phone Note Call from Patient   Summary of Call: Summer Lopez (01/25/2071) says her back is still hurting alot, wants to know if you will order ESI or does she need to come back in? # at work [phone]  ext [phone] Initial call taken by: March Service,  April 15, 2008 9:08 AM  Follow-up for Phone Call        esi first  Follow-up by: Elsa Halls MD,  April 15, 2008 9:35 AM

## 2010-02-28 NOTE — Assessment & Plan Note (Signed)
 Summary: RT FOOT PAIN NO XR/BCBS/BSF   Vital Signs:  Patient Profile:   40 Years Old Female Weight:      305 pounds (138.64 kg) Pulse rate:   80 / minute Resp:     18 per minute  Vitals Entered By: Elsa Halls MD (August 19, 2007 3:41 PM)                 Chief Complaint:  right foot/heel pain.  History of Present Illness: I saw Summer Lopez in the office today for an initial visit.  She is a 40 years old woman with the complaint of:  right heel pain.  [name] comes in complaining of plantar heel pain without any history of injury. Symptoms have been present for one week and she has noticed some swelling in the foot. Her pain is most notable in the morning when she gets out of bed and she gets out of a chair.  She is taking no medicines at this time for this pain.    Updated Prior Medication List: SYNTHROID  112 MCG  TABS (LEVOTHYROXINE  SODIUM)  JANUVIA 100 MG  TABS (SITAGLIPTIN PHOSPHATE)  ZOLOFT 50 MG  TABS (SERTRALINE HCL)  NABUMETONE 500 MG  TABS (NABUMETONE) 1 by mouth two times a day  Current Allergies: No known allergies   Past Medical History:    diabetic    thyroid    ulcer history    reflux    depression  Past Surgical History:    partial hysterectomy    gastric bypass   Family History:    Family History of Diabetes  Social History:    Patient is married.     financial/education   Risk Factors:  Tobacco use:  never Caffeine use:  2 drinks per day Alcohol use:  no   Review of Systems  General      Complains of weight gain.      Denies weight loss, fever, chills, and fatigue.  Cardiac      Denies chest pain, angina, heart attack, heart failure, poor circulation, blood clots, and phlebitis.  Resp      Denies short of breath, difficulty breathing, COPD, cough, and pneumonia.  GI      Complains of constipation, ulcers, and reflux.      Denies nausea, vomiting, diarrhea, difficulty swallowing, and GERD.  GU      Denies kidney  failure, kidney transplant, kidney stones, burning, poor stream, testicular cancer, blood in urine, and .  Neuro      Complains of headache and migraines.      Denies dizziness, numbness, weakness, tremor, and unsteady walking.  MS      Denies joint pain, rheumatoid arthritis, joint swelling, gout, bone cancer, osteoporosis, and .  Endo      Complains of thyroid disease and diabetes.      Denies goiter.  Psych      Complains of depression.      Denies mood swings, anxiety, panic attack, bipolar, and schizophrenia.  Derm      Denies eczema, cancer, and itching.  EENT      Denies poor vision, cataracts, glaucoma, poor hearing, vertigo, ears ringing, sinusitis, hoarseness, toothaches, and bleeding gums.  Immunology      Denies seasonal allergies, sinus problems, and allergic to bee stings.  Lymphatic      Denies lymph node cancer and lymph edema.   Physical Exam  Examination reveals normal development growing hygiene appearance no deformity  Cardiovascular findings are  normal on observation and palpation.  Lymphatic system deferred  Neuropsychological findings include normal sensation reflexes mood and affect. She is awake and alert.  Skin is normal.  Musculoskeletal:   Upon inspection she has a somewhat flattened arch with tenderness in the plantar fascia, muscle strength and muscle tone normal, ankle stability normal.    Impression & Recommendations:  Problem # 1:  PLANTAR FACIITIS (ICD-728.71) Assessment: New  Orders: New Patient Level III (62952) Foot x-ray complete, minimum 3 views (84132)   show normal alignment normal bones.  Impression normal foot films  Assessment: Her symptoms appear to be mild and I recommended exercises, I gave her some patient education materials. Anti-inflammatories.  Medications Added to Medication List This Visit: 1)  Synthroid  112 Mcg Tabs (Levothyroxine  sodium) 2)  Januvia 100 Mg Tabs (Sitagliptin phosphate) 3)  Zoloft  50 Mg Tabs (Sertraline hcl) 4)  Nabumetone 500 Mg Tabs (Nabumetone) .Aaron Aas.. 1 by mouth two times a day   Patient Instructions: 1)  Plantar Fasciitis    Prescriptions: NABUMETONE 500 MG  TABS (NABUMETONE) 1 by mouth two times a day  #60 x 2   Entered and Authorized by:   Elsa Halls MD   Signed by:   Elsa Halls MD on 08/19/2007   Method used:   Print then Give to Patient   RxID:   [id number]  ]

## 2010-02-28 NOTE — Letter (Signed)
 Summary: Out of Work  [facility]  [address]  [place], [place] [address]   Phone: [phone]  Fax: [phone]    January 03, 2009   Employee:  Summer Lopez    To Whom It May Concern:   For Medical reasons, please excuse the above named employee from work for the following dates:  Start:   December 21, 2008  Patient is medically cleared to return to work, regular schedule, full duty:   January 12, 2009   If you need additional information, please feel free to contact our office.         Sincerely,    Author Legato, MD

## 2010-02-28 NOTE — Assessment & Plan Note (Signed)
 Summary: MRI RESULTS FROM AP/FRS   History of Present Illness:   Today 04/07/08 we are seeing 40 year old Summer Lopez in our office to go over MRI of the L spine taken at [facility] 04/02/08 for low back pain with radiation into the right hip and leg.  Lorcet plus and neurontin    Doing better today.   IMPRESSION:   1.  Stable appearance of mild facet arthropathy at L4-5 with mild disc degeneration and minimal grade 1 anterior subluxation, but without significant change from the prior exam and without specific findings of stenosis or impingement.     Allergies: 1)  ! * Mobic 2)  ! Morphine   Impression & Recommendations:  Problem # 1:  LOW BACK PAIN (ICD-724.2) Assessment Improved  Her updated medication list for this problem includes:    Tramadol Hcl 50 Mg Tabs (Tramadol hcl)    Lorcet Plus 7.5-650 Mg Tabs (Hydrocodone -acetaminophen ) .Aaron Aas... 1 by mouth q 4 as needed  Orders: Est. Patient Level III (33295)  Problem # 2:  H N P-LUMBAR (ICD-722.10) Assessment: Improved  no evidence of lumbar disc herniation, stable facet arthritis L4-L5 and L3-L4 with mild anterolisthesis grade 1  Orders: Est. Patient Level III (18841)  Patient Instructions: 1)  continue neurontin  3 x a day 2)   come back in 4 weeks for a recheck on back.

## 2010-02-28 NOTE — Letter (Signed)
 Summary: Historic Patient File  Historic Patient File   Imported By: March Service 08/20/2007 08:44:54  _____________________________________________________________________  External Attachment:    Type:   Image     Comment:   history form

## 2010-02-28 NOTE — Letter (Signed)
 Summary: Out of Work  [facility]  [address]  [place], [place] [address]   Phone: [phone]  Fax: [phone]    April 07, 2008   Employee:  Summer Lopez    To Whom It May Concern:   For Medical reasons, please excuse the above named employee from work for the following dates:  Start:   April 07, 2008  End:   April 08, 2008  Return to work:  April 08, 2008  If you need additional information, please feel free to contact our office.         Sincerely,    Author Legato, MD

## 2010-02-28 NOTE — Assessment & Plan Note (Signed)
 Summary: injection in rt heel/bcbs/bsf    History of Present Illness: I saw Terria Yakubov in the office today for a followup visit.  She is a 40 years old woman with the complaint of:  right heel pain, would like injection.  Stopped taking relafen because of black stools for 5 days.  Previous history:  I saw Trenia Summa in the office today for an initial visit.  She is a 40 years old woman with the complaint of:  right heel pain.  Mrs. Faylene Hoots comes in complaining of plantar heel pain without any history of injury. Symptoms have been present for one week and she has noticed some swelling in the foot. Her pain is most notable in the morning when she gets out of bed and she gets out of a chair.  She is taking no medicines at this time for this pain.    Current Allergies: No known allergies       Physical Exam  Examination reveals normal development growing hygiene appearance no deformity  Cardiovascular findings are normal on observation and palpation.  Lymphatic system deferred  Neuropsychological findings include normal sensation reflexes mood and affect. She is awake and alert.  Skin is normal.  Musculoskeletal:   Upon inspection she has a somewhat flattened arch with tenderness in the plantar fascia, muscle strength and muscle tone normal, ankle stability normal.    Impression & Recommendations:  Problem # 1:  PLANTAR FACIITIS (ICD-728.71) Assessment: Deteriorated  Orders: Est. Patient Level III (51884) Depo- Medrol  40mg  (J1030) Joint Aspirate / Injection, Intermediate (16606) consent for his right heel injection. 1% lidocaine  with Depo-Medrol  injected sterile technique no complications.     Patient Instructions: 1)  You have received an injection of cortisone today. You may experience increased pain at the injection site. Apply ice pack to the area for 20 minutes every 2 hours and take 2 xtra strength tylenol  every 8 hours. This increased pain will usually  resolve in 24 hours. The injection will take effect in 3-10 days.   2)  apply ice daily and do stretching exercises  3)  wear night splint  4)  Please schedule a follow-up appointment in 1 month.   ]

## 2010-02-28 NOTE — Letter (Signed)
 Summary: Fmla form  Fmla form   Imported By: Barnabas Booth 06/03/2008 18:23:00  _____________________________________________________________________  External Attachment:    Type:   Image     Comment:   External Document

## 2010-02-28 NOTE — Assessment & Plan Note (Signed)
 Summary: to discuss scheduling knee scope for 12/28/08.cbt   Visit Type:  Follow-up  CC:  right knee pain.  History of Present Illness: I saw Summer Lopez in the office today for a followup visit.  She is a 40 years old woman with the complaint of: right knee pain   I would like to scheduel surgery.  DX: Large radial tear at the root of the anterior horn lateral meniscus  MRI 11/2007   Treatment:pain medication, activity modification.  MEDS: Lorcet plus  Complaints: WHEN THE COLD WEATHER STARTED THE PAIN INCREASED. PAIN IS SEVERE AND LOCATED ANTERIOR AND LATERAL ASSOCIATED WITH PAIN AND SWELLING  IT GIVES OUT A LOT   Today, scheduled for:  recheck.   Current Medications (verified): 1)  Synthroid  112 Mcg  Tabs (Levothyroxine  Sodium) 2)  Januvia 100 Mg  Tabs (Sitagliptin Phosphate) 3)  Zoloft 50 Mg  Tabs (Sertraline Hcl) 4)  Lantus  Solostar 100 Unit/ml Soln (Insulin  Glargine) 5)  Celexa 40 Mg Tabs (Citalopram Hydrobromide) 6)  Vitamin D 50000 Unit Caps (Ergocalciferol) 7)  Melatonin 5 Mg Tabs (Melatonin) 8)  Tramadol Hcl 50 Mg Tabs (Tramadol Hcl) 9)  Tylenol  Pm Extra Strength 1000-50 Mg/61ml Liqd (Diphenhydramine-Apap (Sleep)) 10)  Norco 5-325 Mg Tabs (Hydrocodone -Acetaminophen ) .... One By Mouth Q 4 Hrs As Needed Pain 11)  Mobic 15 Mg Tabs (Meloxicam) .... One By Mouth Qd 12)  Neurontin  100 Mg Caps (Gabapentin ) .Aaron Aas.. 1 At Bedtime 13)  Norco 7.5-325 Mg Tabs (Hydrocodone -Acetaminophen ) .Aaron Aas.. 1 By Mouth Q 4 As Needed Pain  Allergies (verified): 1)  ! * Mobic 2)  ! Morphine  Past History:  Past Medical History: Last updated: 08/19/2007 diabetic thyroid ulcer history reflux depression  Past Surgical History: Last updated: 03/31/2008 partial hysterectomy gastric bypass gastric band  Family History: Last updated: 08/19/2007 Family History of Diabetes  Social History: Last updated: 08/19/2007 Patient is married.  financial/education  Risk Factors: Caffeine  Use: 2 (08/19/2007)  Risk Factors: Smoking Status: never (08/19/2007)  Review of Systems General:  Denies weight loss, weight gain, fever, chills, and fatigue. Cardiac :  Denies chest pain, angina, heart attack, heart failure, poor circulation, blood clots, and phlebitis. Resp:  Denies short of breath, difficulty breathing, COPD, cough, and pneumonia. GI:  Denies nausea, vomiting, diarrhea, constipation, difficulty swallowing, ulcers, GERD, and reflux. GU:  Denies kidney failure, kidney transplant, kidney stones, burning, poor stream, testicular cancer, blood in urine, and . Neuro:  See HPI. MS:  See HPI. Endo:  Denies thyroid disease, goiter, and diabetes. Psych:  Denies depression, mood swings, anxiety, panic attack, bipolar, and schizophrenia. Derm:  Denies eczema, cancer, and itching. EENT:  Denies poor vision, cataracts, glaucoma, poor hearing, vertigo, ears ringing, sinusitis, hoarseness, toothaches, and bleeding gums. Immunology:  Denies seasonal allergies, sinus problems, and allergic to bee stings. Lymphatic:  Denies lymph node cancer and lymph edema.  Physical Exam  Msk:  The patient is well developed and nourished, with normal grooming and hygiene. The body habitus is   Pulses:  The pulses and perfusion were normal with normal color, temperature  and no swelling  Extremities:  Her upper extremities are normal.  Inspection no deformity, range of motion full, motor strength normal, joint subluxations none.  RIGHT knee anterior lateral tenderness, mild joint effusion.  Flexion arc 120.  Stability normal.  Muscle tone strength normal. Neurologic:  The coordination and sensation were normal  The reflexes were normal    Skin:  intact without lesions or rashes Cervical Nodes:  no  significant adenopathy Psych:  alert and cooperative; normal mood and affect; normal attention span and concentration   Impression & Recommendations:  Problem # 1:  BUCKET HANDLE TEAR OF LATERAL  MENISCUS (ICD-717.41)  SARK,  LATERAL MENISCUS TEAR   Orders: Est. Patient Level IV (76160)  Medications Added to Medication List This Visit: 1)  Norco 7.5-325 Mg Tabs (Hydrocodone -acetaminophen ) .Aaron Aas.. 1 by mouth q 4 as needed pain  Patient Instructions: 1)  DOS 12/21/2008 2)  post op  12/28/2008 3)  Preop 12/17/08 8:45am, take packet with you 4)    5)  Informed consent process: I have discussed the procedure with the patient. I have answered their questions. The risks of bleeding, infection, nerve and vascualr injury have been discussed. The diagnosis and reason for surgery have been explained. The patient demonstrates understanding of this discussion. Specific to this procedure risks include:  6)  swelling, pain, retear meniscus  Prescriptions: NORCO 7.5-325 MG TABS (HYDROCODONE -ACETAMINOPHEN ) 1 by mouth q 4 as needed pain  #60 x 1   Entered and Authorized by:   Elsa Halls MD   Signed by:   Elsa Halls MD on 12/01/2008   Method used:   Print then Give to Patient   RxID:   [id number]

## 2010-02-28 NOTE — Progress Notes (Signed)
Summary: ESI referral.  Phone Note Outgoing Call   Call placed by: Santo Held,  April 27, 2009 4:09 PM Call placed to: Specialist Action Taken: Information Sent Summary of Call: I faxed a referral for the patient to have ESI injections.

## 2010-02-28 NOTE — Letter (Signed)
 Summary: *Orthopedic No Show Letter #1  [facility]  [address]  [place], [place] [address]   Phone: [phone]  Fax: [phone]    08/04/2008    Ms. Kaliya Shreiner [address], [address] [place], [place]  [address]    Dear Ms. Lex,   Our records indicate that you missed your scheduled appointment with Dr. Arnaldo Betters on August 03, 2008.  Please contact this office to reschedule your appointment as soon as possible.  It is important that you keep your scheduled appointments with your physician, so we can provide you the best care possible.  We have enclosed an appointment card for your convenience.      Sincerely,    Dr. Author Legato, MD [facility] Phone [phone]

## 2010-02-28 NOTE — Letter (Signed)
 Summary: letter of medical necessity for knee immoblizer  letter of medical necessity for knee immoblizer   Imported By: March Service 06/07/2008 09:42:24  _____________________________________________________________________  External Attachment:    Type:   Image     Comment:   External Document

## 2010-02-28 NOTE — Letter (Signed)
Summary: surgery order RT knee  surgery order RT knee   Imported By: Ihor Austin 02/16/2009 12:37:39  _____________________________________________________________________  External Attachment:    Type:   Image     Comment:   External Document

## 2010-02-28 NOTE — Assessment & Plan Note (Signed)
 Summary: POST OP 1/RT KNEE ARTH/SURG 12/21/08/BCBS/CAF   Visit Type:  Follow-up  CC:  post opp visit .  History of Present Illness: I saw Mark Tandy in the office today for a followup visit.  She is a 40 years old woman with the complaint of:    POST OP #1 RIGHT KNEE.  DOS  12-21-08.  Procedure   Arthroscopy right knee and lateral meniscal repair.  Medication   Lorcet 10mg  and Phenergan  25mg .  Subjectives   She states that her knee has some tenderness, but otherwise good.  portal incisions look goodLateral outside in incision for the repair. Still needs to keep the sutures in. She is bending her knee well.     Allergies: 1)  ! * Mobic 2)  ! Morphine   Impression & Recommendations:  Problem # 1:  BUCKET HANDLE TEAR OF LATERAL MENISCUS (ICD-717.41) Assessment Improved  recommend a home exercise program concentrating punch resect strengthening  Orders: Post-Op Check (95188)  Patient Instructions: 1)  return Dec 6th for suture removal  2)  Continue exercises at home

## 2010-02-28 NOTE — Letter (Signed)
 Summary: Work [facility]  [address]  [place], [place] [address]   Phone: [phone]  Fax: [phone]    Today's Date: July 29, 2008  Name of Patient: Summer Lopez  The above named patient had a medical visit today at:  12:00 pm.  Please take this into consideration when reviewing the time away from work/school.    Special Instructions:  [  ] None  [  ] To return to the normal work schedule today, July 1st, 2010. The next scheduled appointment is on:   August 03, 2008.  [  ] Other ________________________________________________________________ ________________________________________________________________________   Sincerely yours,   Author Legato, MD

## 2010-02-28 NOTE — Miscellaneous (Signed)
 Summary: esi order faxed  Clinical Lists Changes  si order faxed, mri report faxed and last office not faxed.

## 2010-02-28 NOTE — Progress Notes (Signed)
Summary: Patient requests refill of pain medication  Phone Note Call from Patient   Caller: Patient Summary of Call: Patient asked for refill for Hydrocodone 7.5.  States uses [facility] pharmacy in [place]. Initial call taken by: Ihor Austin,  April 26, 2009 2:56 PM  Follow-up for Phone Call        ok Follow-up by: Arther Abbott MD,  April 26, 2009 3:06 PM    Prescriptions: NORCO 7.5-325 MG TABS (HYDROCODONE-ACETAMINOPHEN) 1 by mouth q 4 as needed pain  #60 x 1   Entered by:   Peter Minium   Authorized by:   Arther Abbott MD   Signed by:   Peter Minium on 04/26/2009   Method used:   Historical   RxID:   [id number]

## 2010-02-28 NOTE — Assessment & Plan Note (Signed)
 Summary: AP ER FOL UP/SCIATICA/REQ INJEC/?XR/BCBS/CAF    History of Present Illness: I saw Summer Lopez in the office today for a followup visit.  She is a 40 years old woman with the complaint of:  sciatica,   The patient complains of severe RIGHT lower extremity leg pain radiating from her gluteal region down her RIGHT leg to her RIGHT foot, associated with numbness on the dorsum of the foot.  She has a history of bulging disc which was evaluated by MRI on January 01, 2007.  She has had increasing pain in the RIGHT leg starting today.  She was in the emergency room today.  She was concerned that she may have a blood clot and an ultrasound was done and was negative.  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  Review of systems: She denies any unexplained weight loss, fever chills nausea diarrhea or malaise.  She has no history of cancer.      Updated Prior Medication List: SYNTHROID  112 MCG  TABS (LEVOTHYROXINE  SODIUM)  JANUVIA 100 MG  TABS (SITAGLIPTIN PHOSPHATE)  ZOLOFT 50 MG  TABS (SERTRALINE HCL)  LANTUS  SOLOSTAR 100 UNIT/ML SOLN (INSULIN  GLARGINE)  CELEXA 40 MG TABS (CITALOPRAM HYDROBROMIDE)  VITAMIN D 50000 UNIT CAPS (ERGOCALCIFEROL)  MELATONIN 5 MG TABS (MELATONIN)  TRAMADOL HCL 50 MG TABS (TRAMADOL HCL)  TYLENOL  PM EXTRA STRENGTH 1000-50 MG/30ML LIQD (DIPHENHYDRAMINE-APAP (SLEEP))  NEURONTIN  100 MG CAPS (GABAPENTIN ) 1 today, 1 q12 tomorrow, 1 q 8 hrs friday and continue LORCET PLUS 7.5-650 MG TABS (HYDROCODONE -ACETAMINOPHEN ) 1 by mouth q 4 as needed  Current Allergies (reviewed today): ! * MOBIC ! MORPHINE  Past Medical History:    Reviewed history from 08/19/2007 and no changes required:       diabetic       thyroid       ulcer history       reflux       depression  Past Surgical History:    Reviewed history from 08/19/2007 and no changes required:       partial hysterectomy       gastric bypass       gastric  band   Family History:    Reviewed history from 08/19/2007 and no changes required:       Family History of Diabetes  Social History:    Reviewed history from 08/19/2007 and no changes required:       Patient is married.        financial/education   Risk Factors: Tobacco use:  never Caffeine use:  2 drinks per day Alcohol use:  no   Review of Systems      See HPI   Physical Exam  She is an obese black female who is actually in some distress.  She has normal pulse and perfusion to both lower extremities.  There is no lymphadenopathy in the lower extremities.  The skin on her back and legs is normal.  She has decreased sensation in the dorsum of the foot were normal reflexes.  She is awake and alert but she is in distress.  Chest tenderness in the lower part of her back and in the LEFT and RIGHT paraspinal regions with tenderness in the RIGHT gluteal region a positive straight leg raise test at 45 and negative straight leg raise contralateral test.  Her extremities are without contracture subluxation atrophy tremor or malalignment.    Impression & Recommendations:  Problem # 1:  LOW BACK  PAIN (ICD-724.2) Assessment: New The x-rays were done at [facility]. The report and the films have been reviewed. US  neg for dvt  MRI 2008 Disc bulge   The following medications were removed from the medication list:    Darvocet-n 100 100-650 Mg Tabs (Propoxyphene n-apap) .Aaron Aas... 1 by mouth q 4 as needed pain     Lorcet Plus 7.5-650 Mg Tabs (Hydrocodone -acetaminophen ) .Aaron Aas... 1 by mouth q 4 as needed  Assessment:  I think she has an acute disc or at least an acute sciatica.  We recommended that she go home and have bed rest for 2 days and then try to resume normal activity she can take Lorcet plus for pain.  She wanted Percocet I advised her against it but she can go ahead and take 20 that she hasn't been that runs out switched to Lorcet plus.  I avoided steroids because of her diabetes   Est.  Patient Level IV (86578)   Problem # 2:  H N P-LUMBAR (ICD-722.10) Assessment: New    Medications Added to Medication List This Visit: 1)  Lantus  Solostar 100 Unit/ml Soln (Insulin  glargine) 2)  Celexa 40 Mg Tabs (Citalopram hydrobromide) 3)  Vitamin D 50000 Unit Caps (Ergocalciferol) 4)  Melatonin 5 Mg Tabs (Melatonin) 5)  Tramadol Hcl 50 Mg Tabs (Tramadol hcl) 6)  Tylenol  Pm Extra Strength 1000-50 Mg/27ml Liqd (Diphenhydramine-apap (sleep)) 7)  Neurontin  100 Mg Caps (Gabapentin ) .Aaron Aas.. 1 today, 1 q12 tomorrow, 1 q 8 hrs friday and continue 8)  Lorcet Plus 7.5-650 Mg Tabs (Hydrocodone -acetaminophen ) .Aaron Aas.. 1 by mouth q 4 as needed   Patient Instructions: 1)  bed rest x 2 days  2)  then gradually resume normal activity 3)  take percocet and the neuronitn for pain  4)  Use heat 20 min 3 x a day  5)  MRI L spine    Prescriptions: NEURONTIN  100 MG CAPS (GABAPENTIN ) 1 today, 1 q12 tomorrow, 1 q 8 hrs friday and continue  #90 x 0   Entered and Authorized by:   Elsa Halls MD   Signed by:   Elsa Halls MD on 03/31/2008   Method used:   Print then Give to Patient   RxID:   [id number] LORCET PLUS 7.5-650 MG TABS (HYDROCODONE -ACETAMINOPHEN ) 1 by mouth q 4 as needed  #60 x 0   Entered and Authorized by:   Elsa Halls MD   Signed by:   Elsa Halls MD on 03/31/2008   Method used:   Print then Give to Patient   RxID:   [id number] NEURONTIN  100 MG CAPS (GABAPENTIN ) 1 today, 1 q12 tomorrow, 1 q 8 hrs friday and continue  #90 x 0   Entered and Authorized by:   Elsa Halls MD   Signed by:   Elsa Halls MD on 03/31/2008   Method used:   Print then Give to Patient   RxID:   [id number]

## 2010-02-28 NOTE — Progress Notes (Signed)
 Summary: re: brace order  Phone Note Call from Patient   Caller: Patient Summary of Call: Patient called to follow up on brace order.  Per nurse - confirmed with [facility] Br[employer] company rep, Todd, brace ordered and should be in tomorrow (05/05/08).  * Patient's Cell # to call when in [phone].  Initial call taken by: Barnabas Booth,  May 04, 2008 4:34 PM  Follow-up for Phone Call        spoke w/patient after re-ck'g w/brace rep, who reports that brace was re-routed back d/t  p.o. box address vs. street address issue. Per SME rep Ena Harries, brace is coming Fed Ex by tomorrow am Patient advised. Follow-up by: Barnabas Booth,  May 06, 2008 3:45 PM  Additional Follow-up for Phone Call Additional follow up Details #1::        Pt received brace. Additional Follow-up by: Barnabas Booth,  May 07, 2008 2:38 PM

## 2010-02-28 NOTE — Progress Notes (Signed)
Summary: norco 5 refilled per Dr Lemmie Evens  Phone Note Call from Patient   Caller: Patient Summary of Call: She wants a refill on her Hydrocodone and she said she still does not have any insurance. Her # [phone] Initial call taken by: Santo Held,  October 28, 2009 8:09 AM  Follow-up for Phone Call        #90; 5 refills [facility] [place] Follow-up by: Arther Abbott MD,  October 28, 2009 9:53 AM  Additional Follow-up for Phone Call Additional follow up Details #1::        called in pt advised Additional Follow-up by: Peter Minium,  October 31, 2009 9:46 AM    Prescriptions: NORCO 5-325 MG TABS (HYDROCODONE-ACETAMINOPHEN) one by mouth q 4 hrs as needed pain  #90 x 5   Entered by:   Peter Minium   Authorized by:   Arther Abbott MD   Signed by:   Peter Minium on 10/31/2009   Method used:   Telephoned to ...       [facility] (retail)       [address]       [place], [place]  [address]       Ph: [phone]       Fax: [phone]   RxID:   [id number]

## 2010-02-28 NOTE — Progress Notes (Signed)
 Summary: Pt requests a prescription  Phone Note Call from Patient   Caller: Patient Summary of Call: DOB 13-Nov-2070 Patient called to ask if Dr Phyllis Breeze would prescribe a "muscle relaxer" for her back. States is resuming physical therapy this week at [facility].  I asked if this was from a previous referral for therapy by Dr Phyllis Breeze, states yes.  Patient states uses [facility] in [place]   Initial call taken by: Barnabas Booth,  July 26, 2008 5:19 PM    New/Updated Medications: ROBAXIN  500 MG TABS (METHOCARBAMOL ) 1 by mouth q 6 as needed   Prescriptions: ROBAXIN  500 MG TABS (METHOCARBAMOL ) 1 by mouth q 6 as needed  #60 x 1   Entered and Authorized by:   Elsa Halls MD   Signed by:   Elsa Halls MD on 07/27/2008   Method used:   Faxed to ...       [facility] (retail)       [address]       [place], [place]  [address]       Ph: [phone]       Fax: [phone]   RxID:   [id number]

## 2010-02-28 NOTE — Progress Notes (Signed)
 Summary: wants new prcocet prescription  Phone Note Call from Patient   Summary of Call: Summer Lopez (Jun 10, 2070) wants a new prescription for percocet.  Her # [phone] Initial call taken by: March Service,  August 19, 2008 9:20 AM    Prescriptions: PERCOCET 5-325 MG TABS (OXYCODONE -ACETAMINOPHEN ) one by mouth q 4 hrs as needed pain  #20 x 0   Entered and Authorized by:   Elsa Halls MD   Signed by:   Elsa Halls MD on 08/19/2008   Method used:   Handwritten   RxID:   [id number]

## 2010-02-28 NOTE — Progress Notes (Signed)
 Summary: Authorization request medical notes  Phone Note Call from Patient   Caller: Patient Summary of Call: Patient called to request to do authorization for copy of medical office notes beginning July 2010.  Auth faxed to work # [id number]. Initial call taken by: Barnabas Booth,  December 20, 2008 11:47 AM  Follow-up for Phone Call        Received signed authorization.  Request to be processed through our medical records vendor, [employer]. Patient notifed. Follow-up by: Barnabas Booth,  December 20, 2008 3:14 PM

## 2010-02-28 NOTE — Letter (Signed)
 Summary: Out of Work  [facility]  [address]  [place], [place] [address]   Phone: [phone]  Fax: [phone]    March 31, 2008   Employee:  Summer Lopez    To Whom It May Concern:   For Medical reasons, please excuse the above named employee from work for the following dates:  Start:   March 3,2010   End:   March 5,2010  If you need additional information, please feel free to contact our office.         Sincerely,    Dr. Darrin Emerald

## 2010-02-28 NOTE — Progress Notes (Signed)
 Summary: MRI appointment.  Phone Note Outgoing Call   Call placed by: Jeraldine Molt,  January 20, 2008 3:28 PM Call placed to: Patient Action Taken: Appt scheduled Summary of Call: I called and left the patient a message with her MRI appointment at [facility] on 01-26-08 at 11:30. Patient has [employer], authorization [phone], expires  30 days from 01-16-08. Patient will follow up back here to go over results.

## 2010-02-28 NOTE — Assessment & Plan Note (Signed)
 Summary: POST OP 2/RT KNEE ARTH SURG 12/21/08/BCBS/CAF   Visit Type:  Follow-up   History of Present Illness: I saw Summer Lopez in the office today for a followup visit.  She is a 40 years old woman with the complaint of:  RIGHT KNEE.   POST OP visit  #2  day 13 RIGHT KNEE.  DOS  12-21-08.  Procedure   Arthroscopy right knee and lateral meniscal repair.  Medication   Lorcet 10mg .  Subjectives   She states that she is having pain in her knee anterior, under knee cap.  exercises at home    The incision looks fine. The sutures were removed. The patient has full extension in the RIGHT knee. No swelling, there is some tenderness along the anterior knee along the patellar tendon  Allergies: 1)  ! * Mobic 2)  ! Morphine   Impression & Recommendations:  Problem # 1:  BUCKET HANDLE TEAR OF LATERAL MENISCUS (ICD-717.41)  status post meniscal repair. Continue exercises at home, followup one month  Orders: Post-Op Check (27253)  Patient Instructions: 1)  Please schedule a follow-up appointment in 1 month.

## 2010-02-28 NOTE — Progress Notes (Signed)
 Summary: patient called requesting Norco  Phone Note Call from Patient Call back at Home Phone [phone]   Summary of Call: patient called and requested to have Norco she has not had this from us  before.  Follow-up for Phone Call        where is she having pain and what is the level /10  back , rt knee and bilateral hands, in palms, pain level is 9.  not due for any back esi's until september 2010. Follow-up by: Elsa Halls MD,  September 21, 2008 12:32 PM  Additional Follow-up for Phone Call Additional follow up Details #1::        fillscript Additional Follow-up by: Elna Haggis,  September 21, 2008 1:03 PM    Additional Follow-up for Phone Call Additional follow up Details #2::    norco 5 or 7.5 Follow-up by: Elna Haggis,  September 22, 2008 8:48 AM

## 2010-02-28 NOTE — Progress Notes (Signed)
Summary: patient call back re:RX denial  Phone Note Call from Patient   Summary of Call: Patient called back re: denial of pain medication.  Relayed to patient, per note, about denial and  that her prescribed treatment was ESI injection. She states that she's not able to schedule injections d/t no insurance and unable to afford the self-pay payments.  Asking if she can discuss further w/Dr Aline Brochure.  Asking if any further recommendation? Initial call taken by: Ihor Austin,  Jun 23, 2009 10:38 AM  Follow-up for Phone Call        please give her a Norco 5 mg one q. 4 p.r.n. pain #90, 5 refills and Neurontin 100 mg q.8 hours #90, 5 refills Follow-up by: Arther Abbott MD,  Jun 23, 2009 11:14 AM    Prescriptions: NEURONTIN 100 MG CAPS (GABAPENTIN) 1 at bedtime  #90 x 5   Entered by:   Peter Minium   Authorized by:   Arther Abbott MD   Signed by:   Peter Minium on 06/23/2009   Method used:   Historical   RxID:   [id number] [place] 5-325 MG TABS (HYDROCODONE-ACETAMINOPHEN) one by mouth q 4 hrs as needed pain  #90 x 5   Entered by:   Peter Minium   Authorized by:   Arther Abbott MD   Signed by:   Peter Minium on 06/23/2009   Method used:   Historical   RxID:   [id number]

## 2010-03-02 ENCOUNTER — Other Ambulatory Visit: Payer: Medicaid Other

## 2010-03-02 ENCOUNTER — Encounter (INDEPENDENT_AMBULATORY_CARE_PROVIDER_SITE_OTHER): Payer: Self-pay | Admitting: *Deleted

## 2010-03-02 ENCOUNTER — Other Ambulatory Visit: Payer: Self-pay | Admitting: Gastroenterology

## 2010-03-02 DIAGNOSIS — K625 Hemorrhage of anus and rectum: Secondary | ICD-10-CM

## 2010-03-02 DIAGNOSIS — K5909 Other constipation: Secondary | ICD-10-CM

## 2010-03-02 LAB — FECAL OCCULT BLOOD, IMMUNOCHEMICAL: Fecal Occult Bld: NEGATIVE

## 2010-03-02 NOTE — Letter (Signed)
Summary: medical records fax to PCP Dr Marisue Humble  medical records fax to PCP Dr Marisue Humble   Imported By: Ihor Austin 01/27/2010 10:21:19  _____________________________________________________________________  External Attachment:    Type:   Image     Comment:   External Document

## 2010-03-02 NOTE — Letter (Signed)
Summary: New Patient letter  [facility]  [address] [place], [place] [address]   Phone: [phone]  Fax: [phone]       02/01/2010 MRN: [id number]  [address] [place], [place]  [address]  Dear Ms. Lazar,  Welcome to the Gastroenterology Division at [facility].    You are scheduled to see Dr.  Sharlett Iles on Feb 07, 2010 at 1:30pm on the 3rd floor at [facility] [place] [employer].  We ask that you try to arrive at our office 15 minutes prior to your appointment time to allow for check-in.  We would like you to complete the enclosed self-administered evaluation form prior to your visit and bring it with you on the day of your appointment.  We will review it with you.  Also, please bring a complete list of all your medications or, if you prefer, bring the medication bottles and we will list them.  Please bring your insurance card so that we may make a copy of it.  If your insurance requires a referral to see a specialist, please bring your referral form from your primary care physician.  Co-payments are due at the time of your visit and may be paid by cash, check or credit card.     Your office visit will consist of a consult with your physician (includes a physical exam), any laboratory testing he/she may order, scheduling of any necessary diagnostic testing (e.g. x-ray, ultrasound, CT-scan), and scheduling of a procedure (e.g. Endoscopy, Colonoscopy) if required.  Please allow enough time on your schedule to allow for any/all of these possibilities.    If you cannot keep your appointment, please call [phone] to cancel or reschedule prior to your appointment date.  This allows [place] the opportunity to schedule an appointment for another patient in need of care.  If you do not cancel or reschedule by 5 p.m. the business day prior to your appointment date, you will be charged a $50.00 late cancellation/no-show fee.    Thank you for  choosing [facility] for your medical needs.  We appreciate the opportunity to care for you.  Please visit [place] at our website  to learn more about our practice.                     Sincerely,                                                             The Gastroenterology Division

## 2010-03-02 NOTE — Letter (Signed)
Summary: medical record request   medical record request   Imported By: Ruffin Pyo 01/11/2010 11:46:57  _____________________________________________________________________  External Attachment:    Type:   Image     Comment:   External Document

## 2010-03-02 NOTE — Letter (Signed)
Summary: [facility]: Immunoassay Fecal Occult Blood (iFOB) Order Form  [facility]  [address] [place], [place] [address]   Phone: [phone]  Fax: [phone]      [facility]: Immunoassay Fecal Occult Blood (iFOB) Order Form   February 23, 2010 MRN: [id number]   JANAN BOGIE 27-May-1970   Physicican Name: Dr Verl Blalock Diagnosis Code: 569.3/564.0      Bernita Buffy CMA ([place])

## 2010-03-02 NOTE — Assessment & Plan Note (Signed)
Summary: RECTAL BLEEDING...EM   History of Present Illness Visit Type: Initial Consult Primary GI MD: Verl Blalock MD [place] Primary Provider: Gaynelle Arabian, MD Chief Complaint: Patient c/o 3 weeks brbpr with bowel movements. She also c/o constipation as well as LUQ abdominal pain. History of Present Illness:   Very complex morbidly obese African American female patient of Dr. Gaynelle Arabian  referred today for evaluation of multiple different GI complaints related to previous gastric banding in [facility] in 2000. Obviously this procedure was performed for morbid obesity. She apparently had a perforation of her stomach 5 years later requiring laparoscopic gastric surgery at [facility] in [place]. None of these records are available for review.  She now complains of intermittent nausea and vomiting, dyspepsia, severe refractory constipation, intermittent bright red blood per rectum, and left upper quadrant abdominal pain. CT Scan of the abdomen in February of 2009 was unremarkable except for small umbilical hernia. There is no evidence of bowel obstruction. She has had previous hysterectomy and removal of her right ovary.  She follows with frequent small feedings low fiber diet. In the past she has responded to MiraLax therapy. She also has a diagnosis of fibromyalgia and is on periodic Aleve, oxycodone, Cymbalta, and Celexa. She Has Chronic Iron Deficiency and Takes Tandem daily but no other vitamin supplements. She does not menstruate, denies any current gynecologic problems. Review of her medicine does share that she is on Synthroid, insulin for her diabetes, estradiol patches, and recently AcipHex 20 mg a day which is greatly improved her upper abdominal symptomatology. She denies intolerance to any specific food items. Her weight is progressively risen despite her food restrictions.  She relates her hemoglobin A1c has been approximately 7..  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 In the past she has had reactions to Mobic, morphine, and metformin. She denies any renal or ophthalmologic problems. Family history is noncontributory. She specifically denies light blindness, mucosal problems in her mouth, skin rashes or neurological complaints otherwise.   GI Review of Systems    Reports abdominal pain, acid reflux, bloating, chest pain, heartburn, loss of appetite, nausea, and  vomiting.     Location of  Abdominal pain: RUQ.    Denies belching, dysphagia with liquids, dysphagia with solids, vomiting blood, weight loss, and  weight gain.      Reports change in bowel habits, constipation, rectal bleeding, and  rectal pain.     Denies anal fissure, black tarry stools, diarrhea, diverticulosis, fecal incontinence, heme positive stool, hemorrhoids, irritable bowel syndrome, jaundice, light color stool, and  liver problems.    Current Medications (verified): 1)  Synthroid 125 Mcg Tabs (Levothyroxine Sodium) .... Take One By Mouth Once Daily 2)  Lantus Solostar 100 Unit/ml Soln (Insulin Glargine) .... Inject 36 Units Every Morning 3)  Celexa 40 Mg Tabs (Citalopram Hydrobromide) .... Take One By Mouth Once Daily 4)  Vitamin D 50000 Unit Caps (Ergocalciferol) .... Take One By Mouth Once A Week 5)  Cymbalta 60 Mg Cpep (Duloxetine Hcl) .... Take One By Mouth Once Daily 6)  Oxycodone Hcl 10 Mg Tabs (Oxycodone Hcl) .... Take 1/2 Tb By Mouth Every 6 Hours 7)  Tandem Plus 162-115.2-1 Mg Caps (Fefum-Fepo-Fa-B Cmp-C-Zn-Mn-Cu) .... Take One By Mouth Once Daily 8)  Estradiol 1 Mg Tabs (Estradiol) .... Take One By Mouth Once Daily 9)  Miralax  Pack (Polyethylene Glycol 3350) .... Mix One Packet in [employer] and Drink As  Needed 10)  Novolog Flexpen 100 Unit/ml Soln (Insulin Aspart) .... Inject 14 Units At Reg Meals, 7 Units At Small Meals Plus 1 Unit For Every 104m/dl Above 100 At The Begining of Meals. 11)   Zostrix Diabetic Foot Pain 0.075 % Crea (Capsaicin) .... Apply To Affected Area Three Times A Day 12)  Aciphex 20 Mg Tbec (Rabeprazole Sodium) .... Take One By Mouth Once Daily  Allergies (verified): 1)  ! * Mobic 2)  ! Morphine 3)  ! 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 Past medical, surgical, family and social histories (including risk factors) reviewed for relevance to current acute and chronic problems.  Past Medical History: Reviewed history from 02/02/2010 and no changes required. Current Problems:  MRSA (ICD-041.12) CHRONIC PAIN SYNDROME (ICD-338.4) VITAMIN D DEFICIENCY (ICD-268.9) OBESITY (ICD-278.00) DEPRESSION (ICD-311) CONSTIPATION, CHRONIC (ICD-564.09) GERD (ICD-530.81) HYPOTHYROIDISM (ICD-244.9) DIABETES MELLITUS, TYPE II (ICD-250.00) CLOSED FRACTURE OF ASTRAGALUS (ICD-825.21) PLANTAR FACIITIS (ICD-728.71) BUCKET HANDLE TEAR OF LATERAL MENISCUS (ICD-717.41) TEAR LATERAL MENISCUS (ICD-836.1) CARPAL TUNNEL SYNDROME (ICD-354.0) DERANGEMENT OF ANTERIOR HORN OF LATERAL MENISCUS (ICD-717.42) TEAR M C L (ICD-844.1) LOW BACK PAIN (ICD-724.2) H N P-LUMBAR (ICD-722.10) LOW BACK PAIN (ICD-724.2) JOINT EFFUSION, RIGHT KNEE (ICD-719.06) KNEE PAIN (ICD-719.46) PLANTAR FACIITIS (ICD-728.71) FAMILY HISTORY OF DIABETES (ICD-V18.0)  Past Surgical History: partial hysterectomy unilateral oophorectomy gastric bypass 2005 gastric band 2001 Right knee surgery  Family History: Reviewed history from 08/19/2007 and no changes required. Family History of Diabetes: MGM, MGF, PGM, PGF No FH of Colon Cancer:  Social History: Reviewed history from 02/02/2010 and no changes required. Patient is married. 2 children living/2 deceased financial/education Patient has never smoked.  Daily Caffeine Use 3-5 per day Illicit Drug Use - no Occupation: Financial Aid  Alcohol Use - no  Vital Signs:  Patient  profile:   40year old female Height:      67 inches Weight:      250.38 pounds BMI:     39.36 BSA:     2.23 Pulse rate:   108 / minute Pulse rhythm:   regular BP sitting:   138 / 70  (right arm)  Vitals Entered By: DMadlyn FrankelCMA (Deborra Medina (February 07, 2010 1:43 PM)  Physical Exam  General:  Well developed, well nourished, no acute distress.Attractive but morbidly obese with BMI of 39.36 Head:  Normocephalic and atraumatic. Eyes:  PERRLA, no icterus.exam deferred to patient's ophthalmologist.   Neck:  Supple; no masses or thyromegaly. Lungs:  Clear throughout to auscultation. Heart:  Regular rate and rhythm; no murmurs, rubs,  or bruits. Abdomen:  Soft, nontender and nondistended. No masses, hepatosplenomegaly or hernias noted. Normal bowel sounds.Difficult exam but no definite organomegaly, masses or tenderness. Rectal:  Normal exam.Impacted hard stool the rectal vault which is guaiac-negative. It is difficult to visualize the perianal area because of her obesity. Msk:  Symmetrical with no gross deformities. Normal posture.decreased ROM.   Pulses:  Normal pulses noted. Extremities:  No clubbing, cyanosis, edema or deformities noted.1+ pedal edema.   Neurologic:  Alert and  oriented x4;  grossly normal neurologically. Cervical Nodes:  No significant cervical adenopathy. Psych:  Alert and cooperative. Normal mood and affect.   Impression & Recommendations:  Problem # 1:  RECTAL BLEEDING (ICD-569.3) Assessment Improved probable hemorrhoidal bleeding related to constipation. I prescribed p.r.n. Analpram cream locally. She probably will need colonoscopy although apparently this has been done within the last 5 years but I do not have these  records. Orders: TLB-CBC Platelet - w/Differential (85025-CBCD) TLB-BMP (Basic Metabolic Panel-BMET) (53614-ERXVQMG) TLB-Hepatic/Liver Function Pnl (80076-HEPATIC) TLB-TSH (Thyroid Stimulating Hormone) (84443-TSH) TLB-B12, Serum-Total ONLY  (86761-P50) TLB-Ferritin (93267-TIW) TLB-Folic Acid (Folate) (58099-IPJ) TLB-IBC Pnl (Iron/FE;Transferrin) (83550-IBC) TLB-Amylase (82150-AMYL) TLB-Lipase (83690-LIPASE) TLB-Magnesium (Mg) (83735-MG) TLB-Sedimentation Rate (ESR) (85652-ESR) TLB-IgA (Immunoglobulin A) (82784-IGA) T-Sprue Panel (Celiac Disease Aby Eval) (83516x3/[address])  Problem # 2:  CONSTIPATION (ICD-564.00) Assessment: Deteriorated Probably due decreased fibrin her diet. We will continue nightly MiraLax, try Amitiza 8 micrograms twice a day, fiber supplements as tolerated with a liberal p.o. fluids. Screening labs have been ordered. Orders: TLB-CBC Platelet - w/Differential (85025-CBCD) TLB-BMP (Basic Metabolic Panel-BMET) (82505-LZJQBHA) TLB-Hepatic/Liver Function Pnl (80076-HEPATIC) TLB-TSH (Thyroid Stimulating Hormone) (84443-TSH) TLB-B12, Serum-Total ONLY (19379-K24) TLB-Ferritin (09735-HGD) TLB-Folic Acid (Folate) (92426-STM) TLB-IBC Pnl (Iron/FE;Transferrin) (83550-IBC) TLB-Amylase (82150-AMYL) TLB-Lipase (83690-LIPASE) TLB-Magnesium (Mg) (83735-MG) TLB-Sedimentation Rate (ESR) (85652-ESR) TLB-IgA (Immunoglobulin A) (82784-IGA) T-Sprue Panel (Celiac Disease Aby Eval) (83516x3/[address])  Problem # 3:  FIBROMYALGIA (ICD-729.1) Assessment: Unchanged Stop Aleve and use Celebrex 200 mg twice a day as tolerated while continuing AcipHex 20 mg a day. I suspect she has had some marginal ulceration related to NSAID use. Apparently in the past she has been treated for H. pylori infection. We may need to repeat her breath testing for H. pylori. The patient also relates she has had endoscopy within the last 5 years, but again, we do not have these records for review.  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 We will consider change to Nucynta which is a mu opioid agonist  without bowel side effects.  Problem # 5:  DIABETES MELLITUS, TYPE II (ICD-250.00) Assessment: Improved Continue insulin therapy per primary care.  Problem # 6:  DYSPEPSIA (ICD-536.8) Assessment: Improved Continue AcipHex 20 mg a day as tolerated with office followup in 2 weeks' time.  Problem # 7:  NAUSEA AND VOMITING (ICD-787.01) Assessment: Improved Probable marginal ulcerations related to NSAID use which is improving on PPI therapy. Her symptoms do not seem consistent with mechanical bowel obstruction. There is nothing in her history to suggest pancreatitis, but liver profile, amylase and lipase have been ordered. She certainly would be a good candidate for cholelithiasis, and may need upper abdominal ultrasound exam.  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 and C.  Patient Instructions: 1)  Copy sent to : Gaynelle Arabian, MD 2)  Take Amitiza 17mg one tablet by mouth two times a day. 3)  Take Miralax every night.  4)  Take Celerex one by mouth two times a day.  5)  Buy Zinc, folic acid, Thiamine, Centrum Silver Multivitiam, Selenium. 6)  Please go to the basement today for your labs.  7)  Your prescription(s) have been sent to you pharmacy.  8)  The medication list was reviewed and reconciled.  All changed / newly prescribed medications were explained.  A complete medication list was provided to the patient / caregiver. Prescriptions: CELEBREX 200 MG CAPS (CELECOXIB) take one by mouth two times a day  #60 x 3   Entered by:   LBernita BuffyCMA ([place]   Authorized by:   DSable FeilMD FCone[facility]  Signed by:   LBernita BuffyCMA ([place] on 02/07/2010   Method used:  Electronically to        [facility] [address] (retail)       [address]       [place], [place]  [address]       Ph: [phone]       Fax: [phone]   RxID:   [id number] AMITIZA 8 MCG CAPS (LUBIPROSTONE) take one by mouth two times a day  #60 x 6   Entered by:   Bernita Buffy CMA ([place])   Authorized by:   Sable Feil MD [facility]   Signed by:   Bernita Buffy CMA ([place]) on 02/07/2010   Method used:   Electronically to        [facility] [address] (retail)       [address]       [place], [place]  [address]       Ph: [phone]       Fax: [phone]   RxID:   [id number] MIRALAX  POWD (POLYETHYLENE GLYCOL 3350) mix one capful in 8 ounces of water and drink daily.  #527 x 6   Entered by:   Bernita Buffy CMA ([place])   Authorized by:   Sable Feil MD [facility]   Signed by:   Bernita Buffy CMA ([place]) on 02/07/2010   Method used:   Electronically to        [facility] [address] (retail)       [address]       [place], [place]  [address]       Ph: [phone]       Fax: [phone]   RxID:   [id number]

## 2010-03-02 NOTE — Letter (Signed)
Summary: Gaynelle Arabian MD/[facility] MD/[facility] By: Bubba Hales 02/13/2010 10:33:41  _____________________________________________________________________  External Attachment:    Type:   Image     Comment:   External Document

## 2010-03-02 NOTE — Letter (Signed)
Summary: [employer] denial MRI Medicaid ins.  [employer] denial MRI Medicaid ins.   Imported By: Ihor Austin 02/20/2010 10:03:25  _____________________________________________________________________  External Attachment:    Type:   Image     Comment:   External Document

## 2010-03-02 NOTE — Miscellaneous (Signed)
Summary: faxed esi order to [facility]  Clinical Lists Changes

## 2010-03-02 NOTE — Progress Notes (Signed)
Summary: Patient request for [facility] order  Phone Note Call from Patient   Caller: Patient Summary of Call: Patient called to request order for [facility] [place] ([facility]).  States last had the injection in August 2011. Please advise.  Patient states she has contacted Medicaid about the appeal process for MRI, due to it being denied, and told it may take up to 120 days. Said her Medicaid is temporary as is the [employer].  She is also pursuing having the MRI approved through the discount system. Initial call taken by: Ihor Austin,  February 21, 2010 12:29 PM  Follow-up for Phone Call        L4-5 ESI x 3  Follow-up by: Arther Abbott MD,  February 21, 2010 2:20 PM

## 2010-03-02 NOTE — Assessment & Plan Note (Signed)
Summary: NEW PROB,LEFT KNEE PAIN NEEDS XR/APP'D FOR [place] MEDICAID/BSF   Visit Type:  new problem  CC:  left knee pain.  History of Present Illness: I saw Summer Lopez in the office today for an initial visit.  She is a 40 years old woman with the complaint of:  left knee pain.  Xrays done at [facility] on 01-19-10.   Medications: Lantos Solostar, Novolog, Estradiol, Oxycodone, Vitamin D, Citalopram, Levothyroxine, Cymbalta, Tandem Plus.  This is a 41 year old phenyl status post RIGHT knee arthroscopy presents now with severe medial LEFT knee pain, swelling, and inability to stand on her LEFT knee. She notes pain with extension, crepitance, and some giving way type episodes with no evidence of trauma.  Pain is present for 2 months.    Allergies: 1)  ! * Mobic 2)  ! Morphine 3)  ! Metformin Hcl (Metformin Hcl)  Past History:  Past Medical History: Last updated: 02/02/2010 Current Problems:  MRSA (ICD-041.12) CHRONIC PAIN SYNDROME (ICD-338.4) VITAMIN D DEFICIENCY (ICD-268.9) OBESITY (ICD-278.00) DEPRESSION (ICD-311) CONSTIPATION, CHRONIC (ICD-564.09) GERD (ICD-530.81) HYPOTHYROIDISM (ICD-244.9) DIABETES MELLITUS, TYPE II (ICD-250.00) CLOSED FRACTURE OF ASTRAGALUS (ICD-825.21) PLANTAR FACIITIS (ICD-728.71) BUCKET HANDLE TEAR OF LATERAL MENISCUS (ICD-717.41) TEAR LATERAL MENISCUS (ICD-836.1) CARPAL TUNNEL SYNDROME (ICD-354.0) DERANGEMENT OF ANTERIOR HORN OF LATERAL MENISCUS (ICD-717.42) TEAR M C L (ICD-844.1) LOW BACK PAIN (ICD-724.2) H N P-LUMBAR (ICD-722.10) LOW BACK PAIN (ICD-724.2) JOINT EFFUSION, RIGHT KNEE (ICD-719.06) KNEE PAIN (ICD-719.46) PLANTAR FACIITIS (ICD-728.71) FAMILY HISTORY OF DIABETES (ICD-V18.0)  Past Surgical History: Last updated: 02/02/2010 partial hysterectomy unilateral oophorectomy gastric bypass 2001 gastric band 2005 Right knee surgery  Family History: Last updated: 08/19/2007 Family History of Diabetes  Social History: Last  updated: 02/02/2010 Patient is married. 2 children living/2 deceased financial/education Patient has never smoked.  Daily Caffeine Use 3-5 per day Illicit Drug Use - no Occupation: Financial Aid   Review of Systems Constitutional:  Complains of fatigue; denies weight loss, weight gain, fever, and chills. Gastrointestinal:  Complains of constipation; denies heartburn, nausea, vomiting, diarrhea, and blood in your stools. Musculoskeletal:  Complains of joint pain, swelling, instability, and muscle pain; denies stiffness, redness, and heat. Psychiatric:  Complains of depression; denies nervousness, anxiety, and hallucinations.  The review of systems is negative for Cardiovascular, Respiratory, Genitourinary, Neurologic, Endocrine, Skin, HEENT, Immunology, and Hemoatologic.  Physical Exam  Additional Exam:  GEN: well developed, well nourished, normal grooming and hygiene, no deformity and normal body habitus.   CDV: pulses are normal, no edema, no erythema. no tenderness  Lymph: normal lymph nodes   Skin: no rashes, skin lesions or open sores   NEURO: normal coordination, reflexes, sensation.   Psyche: awake, alert and oriented. Mood normal   Gait: his abnormal with a limp.Supportive cane present RIGHT hand  On inspection she has medial joint line tenderness. Some mild lateral joint line tenderness and what appears to be a knee effusion.  Her range of motion is 120 with painful knee extension, t in the knee appears stable.   no upper extremity abnormalities with normal alignment in the RIGHT knee appears to be functioning well with full range of motion     Impression & Recommendations:  Problem # 1:  MEDIAL MENISCUS TEAR, LEFT (ICD-836.0) Assessment New  radiographs from the hospital for review show 50% loss of joint space medially consistent with osteoarthritis.  My impression is that she has a medial meniscal tear. Recommend MRI.  Orders: Est. Patient Level IV  (70623)  Problem # 2:  OSTEOARTHRITIS, KNEE, LEFT (  ICD-715.96) Assessment: New  Her updated medication list for this problem includes:    Oxycodone Hcl 10 Mg Tabs (Oxycodone hcl) .Marland Kitchen... Take 1/2 tb by mouth every 6 hours  Orders: Est. Patient Level IV (27782)  Patient Instructions: 1)  mri come back for results   Orders Added: 1)  Est. Patient Level IV [42353]

## 2010-03-02 NOTE — Progress Notes (Signed)
Summary: called patient to notify MRI denied  Phone Note Outgoing Call   Call placed to: Patient Summary of Call: Call to patient to relay that MRI has been denied per [facility] insurance notice received.  Left voice mail msg for patient to return call. Advised patient.  She has also received letter of denial.  She will pursue with insurer per phone # and process on letter.  Initial call taken by: Ihor Austin,  February 17, 2010 9:04 AM  Follow-up for Phone Call        Patient had returned call, rec'd message.  I returned call and reached voice mail - message left to return call. Follow-up by: Ihor Austin,  February 20, 2010 9:54 AM

## 2010-03-02 NOTE — Assessment & Plan Note (Signed)
Summary: 2-WEEK F/U APPT...LSW.   History of Present Illness Visit Type: Follow-up Visit Primary GI MD: Verl Blalock MD [place] Primary Provider: Gaynelle Arabian, MD Requesting Provider: na Chief Complaint: F/u for rectal bleeding. Pt states that she is doing much better and denies any GI complaints  History of Present Illness:   This Patient has had alleviation of her constipation and rectal bleeding with Amitiza 8 micrograms twice a day and local anal hydrocortisone suppositories. She denies any GI issues at this time. Labs were all unremarkable several mild iron deficiency, And We Have Placed Her on Tandem. She is on a variety of other medications including insulin, oxycodone, AcipHex 20 mg a day, and Celexa 40 mg a day,   GI Review of Systems      Denies acid reflux, belching, bloating, chest pain, dysphagia with liquids, dysphagia with solids, heartburn, loss of appetite, nausea, vomiting, vomiting blood, weight loss, and  weight gain.        Denies anal fissure, black tarry stools, change in bowel habit, constipation, diarrhea, diverticulosis, fecal incontinence, heme positive stool, hemorrhoids, irritable bowel syndrome, jaundice, light color stool, liver problems, rectal bleeding, and  rectal pain.    Current Medications (verified): 1)  Synthroid 125 Mcg Tabs (Levothyroxine Sodium) .... Take One By Mouth Once Daily 2)  Lantus Solostar 100 Unit/ml Soln (Insulin Glargine) .... Inject 36 Units Every Morning 3)  Celexa 40 Mg Tabs (Citalopram Hydrobromide) .... Take One By Mouth Once Daily 4)  Vitamin D 50000 Unit Caps (Ergocalciferol) .... Take One By Mouth Once A Week 5)  Cymbalta 60 Mg Cpep (Duloxetine Hcl) .... Take One By Mouth Once Daily 6)  Oxycodone Hcl 10 Mg Tabs (Oxycodone Hcl) .... Take 1/2 Tb By Mouth Every 6 Hours 7)  Tandem Plus 162-115.2-1 Mg Caps (Fefum-Fepo-Fa-B Cmp-C-Zn-Mn-Cu) .... Take One By Mouth Two Times A Day 8)  Estradiol 1 Mg Tabs (Estradiol) .... Take  One By Mouth Once Daily 9)  Miralax  Pack (Polyethylene Glycol 3350) .... Mix One Packet in [employer] and Drink As Needed 10)  Novolog Flexpen 100 Unit/ml Soln (Insulin Aspart) .... Inject 14 Units At Reg Meals, 7 Units At Small Meals Plus 1 Unit For Every 6m/dl Above 100 At The Begining of Meals. 11)  Zostrix Diabetic Foot Pain 0.075 % Crea (Capsaicin) .... Apply To Affected Area Three Times A Day 12)  Aciphex 20 Mg Tbec (Rabeprazole Sodium) .... Take One By Mouth Once Daily 13)  Amitiza 8 Mcg Caps (Lubiprostone) .... Take One By Mouth Two Times A Day  Allergies (verified): 1)  ! * Mobic 2)  ! Morphine 3)  ! Metformin Hcl (Metformin Hcl)  Past History:  Past medical, surgical, family and social histories (including risk factors) reviewed for relevance to current acute and chronic problems.  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) NAUSEA AND VOMITING (ICD-787.01) DYSPEPSIA (ICD-536.8) RECTAL BLEEDING (ICD-569.3) LUQ PAIN (ICD-789.02) CONSTIPATION (ICD-564.00) FIBROMYALGIA (ICD-729.1) HEADACHE, CHRONIC (ICD-784.0) ARTHRITIS (ICD-716.90) ANEMIA (ICD-285.9) OSTEOARTHRITIS, KNEE, LEFT (ICD-715.96) MEDIAL MENISCUS TEAR, LEFT (ICD-836.0) MRSA (ICD-041.12) CHRONIC PAIN SYNDROME (ICD-338.4) VITAMIN D DEFICIENCY (ICD-268.9) OBESITY (ICD-278.00) DEPRESSION (ICD-311) CONSTIPATION, CHRONIC (ICD-564.09) GERD (ICD-530.81) HYPOTHYROIDISM (ICD-244.9) DIABETES MELLITUS, TYPE II (ICD-250.00) CLOSED FRACTURE OF ASTRAGALUS (ICD-825.21) PLANTAR FACIITIS (ICD-728.71) BUCKET HANDLE TEAR OF LATERAL MENISCUS (ICD-717.41) TEAR LATERAL MENISCUS (ICD-836.1) CARPAL TUNNEL SYNDROME (ICD-354.0) DERANGEMENT OF ANTERIOR HORN OF LATERAL MENISCUS (ICD-717.42) TEAR M C L (ICD-844.1) LOW BACK PAIN (ICD-724.2) H N P-LUMBAR (ICD-722.10) H N P-LUMBAR (ICD-722.10) LOW BACK PAIN (  ICD-724.2) JOINT EFFUSION, RIGHT KNEE (ICD-719.06) KNEE PAIN (ICD-719.46) PLANTAR  FACIITIS (ICD-728.71) PLANTAR FACIITIS (ICD-728.71) FAMILY HISTORY OF DIABETES (ICD-V18.0)  Past Surgical History: Reviewed history from 02/07/2010 and no changes required. partial hysterectomy unilateral oophorectomy gastric bypass 2005 gastric band 2001 Right knee surgery  Family History: Reviewed history from 02/07/2010 and no changes required. Family History of Diabetes: MGM, MGF, PGM, PGF No FH of Colon Cancer:  Social History: Reviewed history from 02/07/2010 and no changes required. Patient is married. 2 children living/2 deceased financial/education Patient has never smoked.  Daily Caffeine Use 3-5 per day Illicit Drug Use - no Occupation: Unemployed Alcohol Use - no  Review of Systems       The patient complains of arthritis/joint pain and muscle pains/cramps.  The patient denies allergy/sinus, anemia, anxiety-new, back pain, blood in urine, breast changes/lumps, change in vision, confusion, cough, coughing up blood, depression-new, fainting, fatigue, fever, headaches-new, hearing problems, heart murmur, heart rhythm changes, itching, menstrual pain, night sweats, nosebleeds, pregnancy symptoms, shortness of breath, skin rash, sleeping problems, sore throat, swelling of feet/legs, swollen lymph glands, thirst - excessive , urination - excessive , urination changes/pain, urine leakage, vision changes, and voice change.    Vital Signs:  Patient profile:   40 year old female Height:      67 inches Weight:      340 pounds BMI:     53.44 BSA:     2.54 Pulse rate:   96 / minute Pulse rhythm:   regular BP sitting:   138 / 74  (left arm) Cuff size:   regular  Vitals Entered By: Hope Pigeon CMA (February 23, 2010 11:50 AM)  Physical Exam  General:  Well developed, well nourished, no acute distress.healthy appearing and obese.   Head:  Normocephalic and atraumatic. Eyes:  PERRLA, no icterus. Psych:  Alert and cooperative. Normal mood and affect.   Impression &  Recommendations:  Problem # 1:  RECTAL BLEEDING (ICD-569.3) Assessment Improved We Will Check IFOB exam for occult blood in her stools. If these are positive she will need followup colonoscopy.  Problem # 2:  CONSTIPATION (ICD-564.00) Assessment: Improved Continue Amitiza 8 micrograms twice a day. Her constipation is probably related to regular oxycodone use.  Problem # 3:  ANEMIA (ZOX-096.9) Assessment: Unchanged Low serum iron levels noted and she currently is on Tandem iron replacement therapy. I suggested she have this followed up with primary care in several months.  Patient Instructions: 1)  Copy sent to : Gaynelle Arabian, MD 2)  Please go to the basement today for your labs.  3)  I have sent a rx for Aciphex I will call you when I hear back from Medicaid. 4)  The medication list was reviewed and reconciled.  All changed / newly prescribed medications were explained.  A complete medication list was provided to the patient / caregiver. Prescriptions: ACIPHEX 20 MG TBEC (RABEPRAZOLE SODIUM) take one by mouth once daily  #30 x 6   Entered by:   Bernita Buffy CMA ([place])   Authorized by:   Sable Feil MD [facility]   Signed by:   Bernita Buffy CMA ([place]) on 02/23/2010   Method used:   Electronically to        [facility] (retail)       [address]       [place],   [address]       Ph: [phone]  Fax: [phone]   RxID:   [id number]

## 2010-03-02 NOTE — Letter (Signed)
Summary: New Patient letter  [facility]  [address] [place], [place] [address]   Phone: [phone]  Fax: [phone]       02/01/2010 MRN: [id number]  DAVINITY FANARA [address] [place], [place]  [address]  Dear Ms. Benscoter,  Welcome to the Gastroenterology Division at [facility].    You are scheduled to see Dr.  Owens Loffler on March 08, 2010 at 2:00pm on the 3rd floor at [facility].  We ask that you try to arrive at our office 15 minutes prior to your appointment time to allow for check-in.  We would like you to complete the enclosed self-administered evaluation form prior to your visit and bring it with you on the day of your appointment.  We will review it with you.  Also, please bring a complete list of all your medications or, if you prefer, bring the medication bottles and we will list them.  Please bring your insurance card so that we may make a copy of it.  If your insurance requires a referral to see a specialist, please bring your referral form from your primary care physician.  Co-payments are due at the time of your visit and may be paid by cash, check or credit card.     Your office visit will consist of a consult with your physician (includes a physical exam), any laboratory testing he/she may order, scheduling of any necessary diagnostic testing (e.g. x-ray, ultrasound, CT-scan), and scheduling of a procedure (e.g. Endoscopy, Colonoscopy) if required.  Please allow enough time on your schedule to allow for any/all of these possibilities.    If you cannot keep your appointment, please call [phone] to cancel or reschedule prior to your appointment date.  This allows [place] the opportunity to schedule an appointment for another patient in need of care.  If you do not cancel or reschedule by 5 p.m. the business day prior to your appointment date, you will be charged a $50.00 late cancellation/no-show fee.    Thank you  for choosing [facility] for your medical needs.  We appreciate the opportunity to care for you.  Please visit [place] at our website  to learn more about our practice.                     Sincerely,                                                             The Gastroenterology Division

## 2010-03-02 NOTE — Letter (Signed)
Summary: history form   history form   Imported By: Tomie China 02/08/2010 13:25:40  _____________________________________________________________________  External Attachment:    Type:   Image     Comment:   External Document

## 2010-03-07 ENCOUNTER — Encounter: Payer: Self-pay | Admitting: Orthopedic Surgery

## 2010-03-07 ENCOUNTER — Ambulatory Visit: Payer: Self-pay | Admitting: Orthopedic Surgery

## 2010-03-07 ENCOUNTER — Ambulatory Visit (INDEPENDENT_AMBULATORY_CARE_PROVIDER_SITE_OTHER): Payer: Medicaid Other | Admitting: Orthopedic Surgery

## 2010-03-07 DIAGNOSIS — IMO0002 Reserved for concepts with insufficient information to code with codable children: Secondary | ICD-10-CM

## 2010-03-07 DIAGNOSIS — M1710 Unilateral primary osteoarthritis, unspecified knee: Secondary | ICD-10-CM

## 2010-03-08 ENCOUNTER — Other Ambulatory Visit: Payer: Self-pay | Admitting: Orthopedic Surgery

## 2010-03-08 ENCOUNTER — Encounter (HOSPITAL_COMMUNITY): Payer: Medicaid Other | Attending: Orthopedic Surgery

## 2010-03-08 DIAGNOSIS — Z01812 Encounter for preprocedural laboratory examination: Secondary | ICD-10-CM | POA: Insufficient documentation

## 2010-03-08 LAB — BASIC METABOLIC PANEL WITH GFR
CO2: 30 meq/L (ref 19–32)
Chloride: 102 meq/L (ref 96–112)
GFR calc Af Amer: >60 mL/min (ref >60)
Potassium: 4.7 meq/L (ref 3.5–5.1)
Sodium: 140 meq/L (ref 135–145)

## 2010-03-08 LAB — SURGICAL PCR SCREEN
MRSA, PCR: NEGATIVE
Staphylococcus aureus: NEGATIVE

## 2010-03-08 LAB — HEMOGLOBIN AND HEMATOCRIT, BLOOD
HCT: 38.5 % (ref 36.0–46.0)
Hemoglobin: 12.6 g/dL (ref 12.0–15.0)

## 2010-03-08 LAB — BASIC METABOLIC PANEL
BUN: 9 mg/dL (ref 6–23)
Calcium: 9.7 mg/dL (ref 8.4–10.5)
Creatinine, Ser: 0.83 mg/dL (ref 0.4–1.2)
GFR calc non Af Amer: >60 mL/min (ref >60)
Glucose, Bld: 84 mg/dL (ref 70–99)

## 2010-03-08 NOTE — Medication Information (Signed)
Summary: Prior autho & approved for Aciphex/[place] Medicaid  Prior autho & approved for Aciphex/[place] Medicaid   Imported By: Phillis Knack 03/02/2010 09:34:19  _____________________________________________________________________  External Attachment:    Type:   Image     Comment:   External Document

## 2010-03-10 ENCOUNTER — Encounter: Payer: Self-pay | Admitting: Orthopedic Surgery

## 2010-03-10 ENCOUNTER — Ambulatory Visit (HOSPITAL_COMMUNITY)
Admission: RE | Admit: 2010-03-10 | Discharge: 2010-03-10 | Disposition: A | Discharge status: Home / Self Care | Payer: Medicaid Other | Source: Ambulatory Visit | Attending: Orthopedic Surgery | Admitting: Orthopedic Surgery

## 2010-03-10 DIAGNOSIS — M23329 Other meniscus derangements, posterior horn of medial meniscus, unspecified knee: Secondary | ICD-10-CM

## 2010-03-10 DIAGNOSIS — M23305 Other meniscus derangements, unspecified medial meniscus, unspecified knee: Secondary | ICD-10-CM | POA: Insufficient documentation

## 2010-03-10 LAB — GLUCOSE, CAPILLARY: Glucose-Capillary: 124 mg/dL — ABNORMAL HIGH (ref 70–99)

## 2010-03-13 ENCOUNTER — Ambulatory Visit (INDEPENDENT_AMBULATORY_CARE_PROVIDER_SITE_OTHER): Payer: Medicaid Other | Admitting: Orthopedic Surgery

## 2010-03-13 ENCOUNTER — Encounter: Payer: Self-pay | Admitting: Orthopedic Surgery

## 2010-03-13 DIAGNOSIS — M1710 Unilateral primary osteoarthritis, unspecified knee: Secondary | ICD-10-CM

## 2010-03-13 DIAGNOSIS — Z9889 Other specified postprocedural states: Secondary | ICD-10-CM | POA: Insufficient documentation

## 2010-03-13 DIAGNOSIS — IMO0002 Reserved for concepts with insufficient information to code with codable children: Secondary | ICD-10-CM

## 2010-03-16 ENCOUNTER — Telehealth: Payer: Self-pay | Admitting: Orthopedic Surgery

## 2010-03-16 ENCOUNTER — Institutional Professional Consult (permissible substitution) (HOSPITAL_COMMUNITY): Payer: Self-pay

## 2010-03-16 NOTE — Progress Notes (Addendum)
Summary: MRI approved for Summer Lopez, Summer LopezDiscount per financial couns  Phone Note Call from Patient   Caller: Patient Summary of Call: Patient, and Summer Lopez, contacted our office re: MRI of LT knee which was denied by insurance.  Due to the [facility] discount in effect, patient has been approved to have MRI at [facility] under the discount, per Summer Lopez with [place] counselor Ms. Yarborough.   Initial call taken by: Ihor Austin,  February 22, 2010 11:24 AM

## 2010-03-16 NOTE — Assessment & Plan Note (Signed)
Summary: MRI results of lt knee/mc discount/frs   Visit Type:  Follow-up Referring Provider:  na Primary Provider:  Gaynelle Arabian, MD  CC:  left knee pain.  History of Present Illness: I saw Summer Lopez in the office today for a followup visit.  She is a 40 years old woman with the complaint of:  left knee. MRI RESULTS TODAY.   Xrays done at [facility] on 01-19-10.   Medications: Lantos Solostar, Novolog, Estradiol, Hydrocodone 10/325, Vitamin D, Citalopram, Levothyroxine, Cymbalta, Tandem Plus, Celebrex.    A 40 year old female with long-standing LEFT knee pain medially with catching and locking and giving way.  She is also followed by the pain clinic for management of chronic back pain and radicular symptoms recently having an epidural injection with moderate relief.  She is ambulatory with a cane in the RIGHT hand.  Her MRI has shown a positive finding of a meniscal tear with degenerative arthritis of the medial compartment.  She is anxious to have surgery to get relief.  She had a RIGHT knee arthroscopy several years ago and did well.  Allergies (verified): 1)  ! * Mobic 2)  ! Morphine 3)  ! 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) NAUSEA AND VOMITING (ICD-787.01) DYSPEPSIA (ICD-536.8) RECTAL BLEEDING (ICD-569.3) LUQ PAIN (ICD-789.02) CONSTIPATION (ICD-564.00) FIBROMYALGIA (ICD-729.1) HEADACHE, CHRONIC (ICD-784.0) ARTHRITIS (ICD-716.90) ANEMIA (ICD-285.9) OSTEOARTHRITIS, KNEE, LEFT (ICD-715.96) MEDIAL MENISCUS TEAR, LEFT (ICD-836.0) MRSA (ICD-041.12) CHRONIC PAIN SYNDROME (ICD-338.4) VITAMIN D DEFICIENCY (ICD-268.9) OBESITY (ICD-278.00) DEPRESSION (ICD-311) CONSTIPATION, CHRONIC (ICD-564.09) GERD (ICD-530.81) HYPOTHYROIDISM (ICD-244.9) DIABETES MELLITUS, TYPE II (ICD-250.00) CLOSED FRACTURE OF ASTRAGALUS (ICD-825.21) PLANTAR FACIITIS  (ICD-728.71) BUCKET HANDLE TEAR OF LATERAL MENISCUS (ICD-717.41) TEAR LATERAL MENISCUS (ICD-836.1) CARPAL TUNNEL SYNDROME (ICD-354.0) DERANGEMENT OF ANTERIOR HORN OF LATERAL MENISCUS (ICD-717.42) TEAR M C L (ICD-844.1) LOW BACK PAIN (ICD-724.2) H N P-LUMBAR (ICD-722.10) H N P-LUMBAR (ICD-722.10) LOW BACK PAIN (ICD-724.2) JOINT EFFUSION, RIGHT KNEE (ICD-719.06) KNEE PAIN (ICD-719.46) PLANTAR FACIITIS (ICD-728.71) PLANTAR FACIITIS (ICD-728.71) FAMILY HISTORY OF DIABETES (ICD-V18.0)  Past Surgical History: Last updated: 02/07/2010 partial hysterectomy unilateral oophorectomy gastric bypass 2005 gastric band 2001 Right knee surgery  Family History: Last updated: 02/07/2010 Family History of Diabetes: MGM, MGF, PGM, PGF No FH of Colon Cancer:  Social History: Last updated: 02/23/2010 Patient is married. 2 children living/2 deceased financial/education Patient has never smoked.  Daily Caffeine Use 3-5 per day Illicit Drug Use - no Occupation: Unemployed Alcohol Use - no  Risk Factors: Caffeine Use: 2 (08/19/2007)  Risk Factors: Smoking Status: never (02/02/2010)  Review of Systems      See HPI Constitutional:  Denies weight loss, weight gain, fever, chills, and fatigue. Cardiovascular:  Denies chest pain and palpitations. Respiratory:  Denies short of breath. Gastrointestinal:  Denies diarrhea. Genitourinary:  Denies painful urination. Neurologic:  Complains of numbness, tingling, and unsteady gait. Musculoskeletal:  Complains of joint pain, swelling, instability, and stiffness. Endocrine:  Denies excessive thirst and exessive urination. Psychiatric:  Denies nervousness. Skin:  Denies changes in the skin. HEENT:  Denies blurred or double vision. Immunology:  Denies sinus problems. Hemoatologic:  Denies easy bleeding and brusing.  Physical Exam  General:  normal appearance, healthy appearing, and obese.   Head:  normocephalic and atraumatic Mouth:  no  deformity or lesions with good dentition Neck:  no masses, thyromegaly, or abnormal cervical nodes Lungs:  clear bilaterally to A & P Heart:  regular rate and rhythm, S1, S2 without murmurs,  rubs, gallops, or clicks Abdomen:  bowel sounds positive; abdomen soft and non-tender without masses, organomegaly, or hernias noted Skin:  intact without lesions or rashes Psych:  alert and cooperative; normal mood and affect; normal attention span and concentration Additional Exam:  LEFT knee exam  Inspection revealed tenderness over the medial compartment with crepitance on range of motion.  Range of motion is limited to 110, somewhat secondary to leg size.  Strength is normal.  The knee joint itself is stable.  McMurray sign is positive.  The upper extremities have normal appearance, ROM, strength and stability.    Impression & Recommendations:  Problem # 1:  MEDIAL MENISCUS TEAR, LEFT (ICD-836.0) Assessment Comment Only  MRI results:  IMPRESSION:   1.  Degenerative radial tear of the posterior horn of the medial meniscus at the meniscal root with significant meniscal extrusion. 2.  Underlying medial compartment degenerative changes. 3.  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 4.  No acute osteochondral or ligamentous findings.   Read By:  Vivia Ewing,  M.D.      Orders: Est. Patient Level IV (48889)  Problem # 2:  OSTEOARTHRITIS, KNEE, LEFT (ICD-715.96) Assessment: Comment Only  Informed consent process: I have discussed the procedure with the patient. I have answered their questions. The risks of bleeding, infection, nerve and vascualr injury have been discussed. The diagnosis and reason for surgery have been explained. The patient demonstrates understanding of this discussion. Specific to this procedure risks include:  swelling, pain from arthritis, stiffness.  Plan arthroscopy LEFT knee with partial medial meniscectomy   The following medications  were removed from the medication list:    Oxycodone Hcl 10 Mg Tabs (Oxycodone hcl) .Marland Kitchen... Take 1/2 tb by mouth every 6 hours  Orders: Est. Patient Level IV (16945)  Patient Instructions: 1)  post op knee scope the 13th    Orders Added: 1)  Est. Patient Level IV [03888]

## 2010-03-16 NOTE — Miscellaneous (Signed)
Summary: esi amd mri   Clinical Lists Changes

## 2010-03-16 NOTE — Miscellaneous (Signed)
  IMPRESSION:   1.  Degenerative radial tear of the posterior horn of the medial meniscus at the meniscal root with significant meniscal extrusion. Clinical Lists Changes  Appended Document:  Procedure: Right L4-L5 Interlaminar Epidural Steroid Injection    2ND SERIES 1ST INJ

## 2010-03-16 NOTE — Letter (Signed)
Summary: surgery order LT knee sched 03/10/10  surgery order LT knee sched 03/10/10   Imported By: Ihor Austin 03/07/2010 12:04:47  _____________________________________________________________________  External Attachment:    Type:   Image     Comment:   External Document

## 2010-03-18 NOTE — Op Note (Signed)
Summer Lopez, Summer Lopez NO.:  [id number]  MEDICAL RECORD NO.:  [id number]           PATIENT TYPE:  O  LOCATION:  [facility]                          FACILITY:  [facility]  PHYSICIAN:  Carole Civil, M.D.DATE OF BIRTH:  [birth year]  DATE OF PROCEDURE:  03/10/2010 DATE OF DISCHARGE:                              OPERATIVE REPORT   HISTORY:  A 40 year old female with chronic left knee pain, medial meniscal symptoms and signs, MRI confirmed torn meniscus.  The patient did not respond to conservative treatment, had locking and giving way episodes, and presented for surgery.  Informed consent was done in the office.  The patient offered continued nonoperative treatment, declined  PREOPERATIVE DIAGNOSES:  Torn medial meniscus, left knee, mild arthritis left knee.  POSTOPERATIVE DIAGNOSES:  Torn medial meniscus, left knee, mild arthritis left knee.  PROCEDURES:  Arthroscopy left knee, partial medial meniscectomy, resection of medial plica.  SURGEON:  Carole Civil, M.D.  No assistants.  ANESTHESIA:  General.  OPERATIVE FINDINGS:  There was a tear of the posterior horn of the medial meniscus at the root.  There was a medial synovial plica.  There were grade 2 chondral changes of the medial compartment.  There was fraying of the ACL.  There were medial osteophytes.  There were lateral facet changes grade 2 of the patella.  There were grade 2 and 3 changes of the trochlea.  PROCEDURE:  Site marking was done in the preop area, the chart was updated, the consent was signed.  The patient was taken to surgery.  She was given a preoperative gram of Ancef.  She was intubated in the operating room in a supine position.  That was smooth.  After sterile prep and drape and time-out, a lateral portal was injected along with the medial portal then a lateral portal was made with an 11- blade.  Scope was introduced into the medial compartment.  Diagnostic arthroscopy  commenced.  The knee was systematically examined from the medial compartment across the notch into the lateral compartment, lateral gutter, patellofemoral joint medial gutter.  A medial portal was established with an 11-blade.  We used a spinal needle to mark the site of the portal.  We inserted a probe and found that the medial meniscal root had a tear in it.  We used a shaver and a 50-degree ArthroCare wand to resect the meniscal tear and balance the meniscus.  We took the probe and confirmed a stable rim.  We then turned our attention to the patellofemoral joint where the medial plica was encountered.  It was thickened and it did rub the femoral condyle when we took the knee into flexion so we resected it.  We used a cautery device to ArthroCare cautery's mode to control any bleeding from the resection of the plica.  We then closed the medial portal with 3-0 nylon sutures, injected 45 mL of Marcaine with epinephrine into the joint through the scope portal, removed the scope, and closed the remaining portal with 3-0 nylon suture.  We applied sterile dressings, Ace wrap, and Cryo-Cuff.  We activated the Cryo-Cuff. The patient  was extubated and taken to the recovery room in stable condition.  Postoperative plan is for her to see me in the office on Monday.  She can start some physical therapy on Tuesday.  I gave her 30 Percocet tablets to take over the weekend.  She can go back down to a hydrocodone when I see her in the office for further pain control.     Carole Civil, M.D.     SEH/MEDQ  D:  03/10/2010  T:  03/10/2010  Job:  [id number]  Electronically Signed by Arther Abbott M.D. on 03/18/2010 10:38:50 AM

## 2010-03-22 NOTE — Assessment & Plan Note (Signed)
Summary: POST OP 1/LT KNEE ARTHROSC/MEDICAID+MC DISC/CAF   Visit Type:  post op Referring Provider:  na Primary Provider:  Gaynelle Arabian, MD  CC:  left knee .  History of Present Illness: I saw Summer Lopez in the office today for a followup visit.  She is a 40 years old woman with the complaint of:  post op #1, left knee.  DOS 03-10-10. Arthroscopy left knee, partial medial meniscectomy, resection of medial plica. operative findings included root tear, medial meniscus, and midfoot arthritis of the lateral patellofemoral joint.    Medications: Percocet 5 mg.  She has an appointment for Therapy on the 16th.  Appearing knee wounds from the portals. No drainage. Sutures are  Patient is advised to start physical therapy and come back in 3 weeks.  We did start some samples of Celebrex because of her ulcer history. This will probably get the [place] and in the week and call for any MCAID OVERIDE   Allergies: 1)  ! * Mobic 2)  ! Morphine 3)  ! Metformin Hcl (Metformin Hcl)   Impression & Recommendations:  Problem # 1:  OSTEOARTHRITIS, KNEE, LEFT (ICD-715.96) Assessment Comment Only  Orders: Post-Op Check (36629)  Problem # 2:  MEDIAL MENISCUS TEAR, LEFT (ICD-836.0) Assessment: Comment Only  Orders: Post-Op Check (47654)  Problem # 3:  ARTHROSCOPY, LEFT KNEE, HX OF (ICD-V45.89) Assessment: New  Orders: Post-Op Check (65035)  Patient Instructions: 1)  f/u in 3 weeks    Orders Added: 1)  Post-Op Check [46568]

## 2010-03-28 NOTE — Progress Notes (Signed)
Summary: call to patient about appointment  Phone Note Outgoing Call   Call placed to: Patient Summary of Call: At 03/13/10 appointment, patient left w/out scheduling her 3 week fol/up visit.  I called and left message,ans machine on 03/14/10, and called back 03/16/10.  Left another message on ans machine. Initial call taken by: Ihor Austin,  March 16, 2010 11:38 AM  Follow-up for Phone Call        Entered an appt per last office visit;  left msg for patient with this information. Follow-up by: Ihor Austin,  March 20, 2010 1:00 PM

## 2010-04-05 ENCOUNTER — Ambulatory Visit (INDEPENDENT_AMBULATORY_CARE_PROVIDER_SITE_OTHER): Payer: Medicaid Other | Admitting: Orthopedic Surgery

## 2010-04-05 ENCOUNTER — Encounter: Payer: Self-pay | Admitting: Orthopedic Surgery

## 2010-04-05 DIAGNOSIS — IMO0002 Reserved for concepts with insufficient information to code with codable children: Secondary | ICD-10-CM

## 2010-04-05 DIAGNOSIS — M1710 Unilateral primary osteoarthritis, unspecified knee: Secondary | ICD-10-CM

## 2010-04-05 DIAGNOSIS — Z9889 Other specified postprocedural states: Secondary | ICD-10-CM

## 2010-04-06 ENCOUNTER — Encounter: Payer: Self-pay | Admitting: Orthopedic Surgery

## 2010-04-07 ENCOUNTER — Telehealth: Payer: Self-pay | Admitting: Orthopedic Surgery

## 2010-04-10 ENCOUNTER — Other Ambulatory Visit: Payer: Self-pay | Admitting: Family Medicine

## 2010-04-10 DIAGNOSIS — Z1231 Encounter for screening mammogram for malignant neoplasm of breast: Secondary | ICD-10-CM

## 2010-04-10 LAB — GLUCOSE, CAPILLARY: Glucose-Capillary: 204 mg/dL — ABNORMAL HIGH (ref 70–99)

## 2010-04-11 LAB — CBC
HCT: 34.2 % — ABNORMAL LOW (ref 36.0–46.0)
Hemoglobin: 11.2 g/dL — ABNORMAL LOW (ref 12.0–15.0)
MCH: 26.2 pg (ref 26.0–34.0)
MCHC: 32.7 g/dL (ref 30.0–36.0)
MCV: 80.1 fL (ref 78.0–100.0)
Platelets: 283 10*3/uL (ref 150–400)
RBC: 4.27 MIL/uL (ref 3.87–5.11)
RDW: 15.3 % (ref 11.5–15.5)
WBC: 8.8 10*3/uL (ref 4.0–10.5)

## 2010-04-11 LAB — DIFFERENTIAL
Basophils Absolute: 0.1 10*3/uL (ref 0.0–0.1)
Basophils Relative: 1 % (ref 0–1)
Eosinophils Absolute: 0.4 10*3/uL (ref 0.0–0.7)
Eosinophils Relative: 4 % (ref 0–5)
Lymphocytes Relative: 49 % — ABNORMAL HIGH (ref 12–46)
Lymphs Abs: 4.4 10*3/uL — ABNORMAL HIGH (ref 0.7–4.0)
Monocytes Absolute: 0.4 10*3/uL (ref 0.1–1.0)
Monocytes Relative: 4 % (ref 3–12)
Neutro Abs: 3.6 10*3/uL (ref 1.7–7.7)
Neutrophils Relative %: 41 % — ABNORMAL LOW (ref 43–77)

## 2010-04-11 LAB — BASIC METABOLIC PANEL WITH GFR
CO2: 27 meq/L (ref 19–32)
Calcium: 9.1 mg/dL (ref 8.4–10.5)
Chloride: 104 meq/L (ref 96–112)
Creatinine, Ser: 0.75 mg/dL (ref 0.4–1.2)
Glucose, Bld: 143 mg/dL — ABNORMAL HIGH (ref 70–99)
Sodium: 139 meq/L (ref 135–145)

## 2010-04-11 LAB — URINALYSIS, ROUTINE W REFLEX MICROSCOPIC
Bilirubin Urine: NEGATIVE
Glucose, UA: 250 mg/dL — AB
Hgb urine dipstick: NEGATIVE
Ketones, ur: NEGATIVE mg/dL
Nitrite: NEGATIVE
Protein, ur: NEGATIVE mg/dL
Specific Gravity, Urine: >1.03 — ABNORMAL HIGH (ref 1.005–1.030)
Urobilinogen, UA: 0.2 mg/dL (ref 0.0–1.0)
pH: 5.5 (ref 5.0–8.0)

## 2010-04-11 LAB — PREGNANCY, URINE: Preg Test, Ur: NEGATIVE

## 2010-04-11 LAB — BASIC METABOLIC PANEL
BUN: 7 mg/dL (ref 6–23)
GFR calc Af Amer: >60 mL/min (ref >60)
GFR calc non Af Amer: >60 mL/min (ref >60)
Potassium: 4 mEq/L (ref 3.5–5.1)

## 2010-04-11 LAB — GLUCOSE, CAPILLARY: Glucose-Capillary: 57 mg/dL — ABNORMAL LOW (ref 70–99)

## 2010-04-11 LAB — SEDIMENTATION RATE: Sed Rate: 42 mm/hr — ABNORMAL HIGH (ref 0–22)

## 2010-04-11 NOTE — Assessment & Plan Note (Signed)
Summary: 3 wk RE-CK LT KNEE/POST OP 2/SURG 03/10/10/CA MCD+MC DISC/CAF ...   Visit Type:  post op Referring Provider:  na Primary Provider:  Gaynelle Arabian, MD  CC:  left knee.  History of Present Illness: I saw Summer Lopez in the office today for a 3 week followup visit.  She is a 40 years old woman with the complaint of:  left knee  DOS 03-10-10. Arthroscopy left knee, partial medial meniscectomy, resection of medial plica. operative findings included root tear, medial meniscus, and midfoot arthritis of the lateral patellofemoral joint.  Medications: none  Complaints: none  The patient did not go to therapy, but she has full range of motion, and no pain. She is ambulating without assistive device.  Return as needed  Allergies: 1)  ! * Mobic 2)  ! Morphine 3)  ! Metformin Hcl (Metformin Hcl)   Impression & Recommendations:  Problem # 1:  ARTHROSCOPY, LEFT KNEE, HX OF (ICD-V45.89) Assessment Improved  Orders: Post-Op Check (75051)  Problem # 2:  OSTEOARTHRITIS, KNEE, LEFT (ICD-715.96) Assessment: Improved  Her updated medication list for this problem includes:    Celebrex 200 Mg Caps (Celecoxib) .Marland Kitchen... 1 by mouth qd  Orders: Post-Op Check (07125)  Problem # 3:  MEDIAL MENISCUS TEAR, LEFT (ICD-836.0) Assessment: Improved  Orders: Post-Op Check (24799)  Patient Instructions: 1)  Please schedule a follow-up appointment as needed.   Orders Added: 1)  Post-Op Check [80012]

## 2010-04-11 NOTE — Progress Notes (Signed)
Summary: celebrex approved for 6 months  Phone Note Outgoing Call Call back at [facility] prior authorization line   Summary of Call: patient needed prior authorization for Celebrex 267m 1 once daily, talked to TErmaat number [phone] med approved for 6 months, faxed approval back to [facility] in  Initial call taken by: CPeter Minium  April 07, 2010 9:10 AM

## 2010-04-12 ENCOUNTER — Encounter: Payer: Self-pay | Admitting: Orthopedic Surgery

## 2010-04-18 NOTE — Letter (Signed)
Summary: Letter of medical necessity for walker  Letter of medical necessity for walker   Imported By: Ruffin Pyo 04/12/2010 12:29:18  _____________________________________________________________________  External Attachment:    Type:   Image     Comment:   External Document

## 2010-04-18 NOTE — Medication Information (Signed)
Summary: RX Folder prior [place] request form  RX Folder prior auth request form   Imported By: Ihor Austin 04/12/2010 19:06:06  _____________________________________________________________________  External Attachment:    Type:   Image     Comment:   External Document

## 2010-04-19 ENCOUNTER — Ambulatory Visit
Admission: RE | Admit: 2010-04-19 | Discharge: 2010-04-19 | Disposition: A | Discharge status: Home / Self Care | Payer: Medicaid Other | Source: Ambulatory Visit | Attending: Family Medicine | Admitting: Family Medicine

## 2010-04-19 DIAGNOSIS — Z1231 Encounter for screening mammogram for malignant neoplasm of breast: Secondary | ICD-10-CM

## 2010-04-20 ENCOUNTER — Other Ambulatory Visit: Payer: Self-pay | Admitting: Orthopedic Surgery

## 2010-04-20 DIAGNOSIS — M549 Dorsalgia, unspecified: Secondary | ICD-10-CM

## 2010-04-21 ENCOUNTER — Other Ambulatory Visit: Payer: Self-pay | Admitting: Family Medicine

## 2010-04-21 DIAGNOSIS — R928 Other abnormal and inconclusive findings on diagnostic imaging of breast: Secondary | ICD-10-CM

## 2010-04-24 ENCOUNTER — Ambulatory Visit
Admission: RE | Admit: 2010-04-24 | Discharge: 2010-04-24 | Disposition: A | Discharge status: Home / Self Care | Payer: Medicaid Other | Source: Ambulatory Visit | Attending: Orthopedic Surgery | Admitting: Orthopedic Surgery

## 2010-04-24 DIAGNOSIS — M549 Dorsalgia, unspecified: Secondary | ICD-10-CM

## 2010-04-27 NOTE — Medication Information (Signed)
Summary: RX Folder Medication discharge instructions  RX Folder Medication discharge instructions   Imported By: Ihor Austin 04/17/2010 63:01:60  _____________________________________________________________________  External Attachment:    Type:   Image     Comment:   External Document

## 2010-04-28 ENCOUNTER — Ambulatory Visit
Admission: RE | Admit: 2010-04-28 | Discharge: 2010-04-28 | Disposition: A | Discharge status: Home / Self Care | Payer: Medicaid Other | Source: Ambulatory Visit | Attending: Family Medicine | Admitting: Family Medicine

## 2010-04-28 ENCOUNTER — Other Ambulatory Visit: Payer: Self-pay | Admitting: Family Medicine

## 2010-04-28 DIAGNOSIS — R928 Other abnormal and inconclusive findings on diagnostic imaging of breast: Secondary | ICD-10-CM

## 2010-04-28 DIAGNOSIS — N6310 Unspecified lump in the right breast, unspecified quadrant: Secondary | ICD-10-CM

## 2010-05-01 LAB — GLUCOSE, CAPILLARY: Glucose-Capillary: 185 mg/dL — ABNORMAL HIGH (ref 70–99)

## 2010-05-03 LAB — BASIC METABOLIC PANEL WITH GFR
CO2: 28 meq/L (ref 19–32)
Chloride: 102 meq/L (ref 96–112)
GFR calc Af Amer: >60 mL/min (ref >60)
Potassium: 3.9 meq/L (ref 3.5–5.1)

## 2010-05-03 LAB — GLUCOSE, CAPILLARY
Glucose-Capillary: 118 mg/dL — ABNORMAL HIGH (ref 70–99)
Glucose-Capillary: 147 mg/dL — ABNORMAL HIGH (ref 70–99)

## 2010-05-03 LAB — BASIC METABOLIC PANEL
BUN: 6 mg/dL (ref 6–23)
Calcium: 9 mg/dL (ref 8.4–10.5)
Creatinine, Ser: 0.58 mg/dL (ref 0.4–1.2)
GFR calc non Af Amer: >60 mL/min (ref >60)
Glucose, Bld: 100 mg/dL — ABNORMAL HIGH (ref 70–99)
Sodium: 137 mEq/L (ref 135–145)

## 2010-05-03 LAB — HEMOGLOBIN AND HEMATOCRIT, BLOOD
HCT: 32.8 % — ABNORMAL LOW (ref 36.0–46.0)
Hemoglobin: 10.6 g/dL — ABNORMAL LOW (ref 12.0–15.0)

## 2010-05-04 ENCOUNTER — Ambulatory Visit
Admission: RE | Admit: 2010-05-04 | Discharge: 2010-05-04 | Disposition: A | Discharge status: Home / Self Care | Payer: Medicaid Other | Source: Ambulatory Visit | Attending: Family Medicine | Admitting: Family Medicine

## 2010-05-04 ENCOUNTER — Other Ambulatory Visit: Payer: Self-pay | Admitting: Diagnostic Radiology

## 2010-05-04 DIAGNOSIS — N6310 Unspecified lump in the right breast, unspecified quadrant: Secondary | ICD-10-CM

## 2010-05-16 ENCOUNTER — Emergency Department (HOSPITAL_COMMUNITY): Admission: EM | Admit: 2010-05-16 | Payer: Medicaid Other | Source: Home / Self Care

## 2010-06-01 ENCOUNTER — Emergency Department (HOSPITAL_COMMUNITY)
Admission: EM | Admit: 2010-06-01 | Discharge: 2010-06-02 | Disposition: A | Discharge status: Home / Self Care | Payer: Medicaid Other | Attending: Emergency Medicine | Admitting: Emergency Medicine

## 2010-06-01 DIAGNOSIS — R51 Headache: Secondary | ICD-10-CM | POA: Insufficient documentation

## 2010-06-01 DIAGNOSIS — E039 Hypothyroidism, unspecified: Secondary | ICD-10-CM | POA: Insufficient documentation

## 2010-06-01 DIAGNOSIS — Z79899 Other long term (current) drug therapy: Secondary | ICD-10-CM | POA: Insufficient documentation

## 2010-06-01 DIAGNOSIS — E119 Type 2 diabetes mellitus without complications: Secondary | ICD-10-CM | POA: Insufficient documentation

## 2010-06-01 DIAGNOSIS — L509 Urticaria, unspecified: Secondary | ICD-10-CM | POA: Insufficient documentation

## 2010-06-05 ENCOUNTER — Other Ambulatory Visit: Payer: Self-pay | Admitting: Orthopedic Surgery

## 2010-06-05 DIAGNOSIS — M549 Dorsalgia, unspecified: Secondary | ICD-10-CM

## 2010-06-06 ENCOUNTER — Ambulatory Visit
Admission: RE | Admit: 2010-06-06 | Discharge: 2010-06-06 | Disposition: A | Discharge status: Home / Self Care | Payer: Medicaid Other | Source: Ambulatory Visit | Attending: Orthopedic Surgery | Admitting: Orthopedic Surgery

## 2010-06-06 DIAGNOSIS — M549 Dorsalgia, unspecified: Secondary | ICD-10-CM

## 2010-06-12 ENCOUNTER — Other Ambulatory Visit: Payer: Self-pay | Admitting: Family Medicine

## 2010-06-12 ENCOUNTER — Inpatient Hospital Stay: Admission: RE | Admit: 2010-06-12 | Payer: Medicaid Other | Source: Ambulatory Visit

## 2010-06-12 ENCOUNTER — Ambulatory Visit
Admission: RE | Admit: 2010-06-12 | Discharge: 2010-06-12 | Disposition: A | Discharge status: Home / Self Care | Payer: Medicaid Other | Source: Ambulatory Visit | Attending: Family Medicine | Admitting: Family Medicine

## 2010-06-12 DIAGNOSIS — M549 Dorsalgia, unspecified: Secondary | ICD-10-CM

## 2010-07-13 ENCOUNTER — Ambulatory Visit: Payer: Medicaid Other | Admitting: Orthopedic Surgery

## 2010-07-13 ENCOUNTER — Ambulatory Visit (INDEPENDENT_AMBULATORY_CARE_PROVIDER_SITE_OTHER): Payer: Medicaid Other | Admitting: Orthopedic Surgery

## 2010-07-13 DIAGNOSIS — M5136 Other intervertebral disc degeneration, lumbar region: Secondary | ICD-10-CM

## 2010-07-13 DIAGNOSIS — M51379 Other intervertebral disc degeneration, lumbosacral region without mention of lumbar back pain or lower extremity pain: Secondary | ICD-10-CM

## 2010-07-13 DIAGNOSIS — M51369 Other intervertebral disc degeneration, lumbar region without mention of lumbar back pain or lower extremity pain: Secondary | ICD-10-CM

## 2010-07-13 DIAGNOSIS — M4310 Spondylolisthesis, site unspecified: Secondary | ICD-10-CM

## 2010-07-13 DIAGNOSIS — M5137 Other intervertebral disc degeneration, lumbosacral region: Secondary | ICD-10-CM

## 2010-07-13 DIAGNOSIS — M1710 Unilateral primary osteoarthritis, unspecified knee: Secondary | ICD-10-CM

## 2010-07-13 DIAGNOSIS — M4317 Spondylolisthesis, lumbosacral region: Secondary | ICD-10-CM

## 2010-07-13 DIAGNOSIS — IMO0002 Reserved for concepts with insufficient information to code with codable children: Secondary | ICD-10-CM

## 2010-07-13 MED ORDER — METHYLPREDNISOLONE ACETATE 40 MG/ML IJ SUSP: 40.0000 mg | Freq: Once | INTRAMUSCULAR | Status: DC

## 2010-07-13 NOTE — Procedures (Signed)
Injection LEFT knee.  Consent was obtained.  Time out was taken   LEFT knee was injected with Depo-Medrol 40 mg plus lidocaine 1% 4 cc.  Knee was prepped with alcohol and anesthetized with ethyl chloride.  The injection was tolerated without complication.

## 2010-07-13 NOTE — Progress Notes (Signed)
Plan [age]-year-old female status post LEFT knee arthroscopy, presents with LEFT knee pain, and lumbar spine pain, history of lumbar disc disease and spondylolisthesis status post epidural injections, patient given pain medications anti-inflammatories, steroids, and Neurontin. No improvement in her lumbar spine pain. Yesterday with LEFT Knee pain. No swelling.  Examination of the LEFT knee reveals she has 120 of flexion with full extension. She has no crepitance or pain on range of motion. She does have lateral joint line symptoms.  Her knee showed mild arthritis of the knee.  Repeat injection done on the LEFT knee.  Since she has failed nonoperative treatment of her lumbar disease. I have told her that we will go ahead and try to get a neurosurgical consult. She's also tried water aerobics in therapy on her back.  Diagnosis #1 Osteoarthritis LEFT knee. Diagnosis #2 degenerative disc disease with spondylolisthesis and lumbar radicular pain  She will follow up on an as-needed basis. As regard her LEFT knee

## 2010-07-13 NOTE — Patient Instructions (Signed)
You have received a steroid shot. 15% of patients experience increased pain at the injection site with in the next 24 hours. This is best treated with ice and tylenol extra strength 2 tabs every 8 hours. If you are still having pain please call the office.    Consult Neurosurgery Spondylolisthesis, L4-5 disc degeneration, leg pain

## 2010-07-17 ENCOUNTER — Telehealth: Payer: Self-pay | Admitting: Radiology

## 2010-07-17 ENCOUNTER — Telehealth: Payer: Self-pay | Admitting: Orthopedic Surgery

## 2010-07-17 NOTE — Telephone Encounter (Signed)
I faxed a referral for this patient to [facility] to be seen for L4-5 Disc Degeneration, Spondylolisthesis, and leg pain.

## 2010-07-17 NOTE — Telephone Encounter (Signed)
Called patient to verify insurance and primary care doctor; she states same.  It has been e-verified in system; relayed to patient referral in progress.

## 2010-08-01 ENCOUNTER — Telehealth: Payer: Self-pay | Admitting: Orthopedic Surgery

## 2010-08-01 ENCOUNTER — Telehealth: Payer: Self-pay | Admitting: Gastroenterology

## 2010-08-01 MED ORDER — RABEPRAZOLE SODIUM 20 MG PO TBEC: 20.0000 mg | DELAYED_RELEASE_TABLET | Freq: Every day | ORAL | Status: DC

## 2010-08-01 NOTE — Telephone Encounter (Signed)
Called patient and advised we do not have any samples

## 2010-08-01 NOTE — Telephone Encounter (Signed)
Summer Lopez called the patient

## 2010-08-01 NOTE — Telephone Encounter (Signed)
Glenetta Borg asked for you to call her at [phone].  She wants to know if you will get her samples of Celebrex, said her insurance has been cancelled and she cannot afford to get them

## 2010-08-01 NOTE — Telephone Encounter (Signed)
Notiifed pt I left samples at the front desk.

## 2010-09-06 ENCOUNTER — Telehealth: Payer: Self-pay | Admitting: *Deleted

## 2010-09-06 MED ORDER — RABEPRAZOLE SODIUM 20 MG PO TBEC: 20.0000 mg | DELAYED_RELEASE_TABLET | Freq: Every day | ORAL | Status: DC

## 2010-09-06 NOTE — Telephone Encounter (Signed)
Gave pt samples of Aciphex and advised her that she can buy omeprazole otc and take one a day since she has no insurance but we can not sample her like a pharmacy.

## 2010-09-23 ENCOUNTER — Encounter: Payer: Self-pay | Admitting: Emergency Medicine

## 2010-09-23 ENCOUNTER — Emergency Department (HOSPITAL_COMMUNITY)
Admission: EM | Admit: 2010-09-23 | Discharge: 2010-09-23 | Disposition: A | Discharge status: Home / Self Care | Payer: Self-pay | Attending: Emergency Medicine | Admitting: Emergency Medicine

## 2010-09-23 DIAGNOSIS — J019 Acute sinusitis, unspecified: Secondary | ICD-10-CM | POA: Insufficient documentation

## 2010-09-23 DIAGNOSIS — I10 Essential (primary) hypertension: Secondary | ICD-10-CM | POA: Insufficient documentation

## 2010-09-23 DIAGNOSIS — Z888 Allergy status to other drugs, medicaments and biological substances status: Secondary | ICD-10-CM | POA: Insufficient documentation

## 2010-09-23 DIAGNOSIS — E119 Type 2 diabetes mellitus without complications: Secondary | ICD-10-CM | POA: Insufficient documentation

## 2010-09-23 DIAGNOSIS — R42 Dizziness and giddiness: Secondary | ICD-10-CM | POA: Insufficient documentation

## 2010-09-23 HISTORY — DX: Cardiomegaly: I51.7

## 2010-09-23 HISTORY — DX: Essential (primary) hypertension: I10

## 2010-09-23 HISTORY — DX: Disorder of thyroid, unspecified: E07.9

## 2010-09-23 MED ORDER — DOXYCYCLINE HYCLATE 100 MG PO CAPS: 100.0000 mg | ORAL_CAPSULE | Freq: Two times a day (BID) | ORAL | Status: AC

## 2010-09-23 MED ORDER — DOXYCYCLINE HYCLATE 100 MG PO TABS
100.0000 mg | ORAL_TABLET | Freq: Once | ORAL | Status: AC
  Administered 2010-09-23: 100 mg via ORAL
  Filled 2010-09-23: qty 1

## 2010-09-23 MED ORDER — MECLIZINE HCL 25 MG PO TABS: 25.0000 mg | ORAL_TABLET | Freq: Three times a day (TID) | ORAL | Status: AC | PRN

## 2010-09-23 NOTE — ED Notes (Signed)
Pt c/o dizziness, diarrhea, weakness and fever. Onset Wed night.

## 2010-09-23 NOTE — ED Provider Notes (Signed)
History     CSN: [id number] Arrival date & time: 09/23/2010  5:29 PM Scribed for Delora Fuel, MD, the patient was seen in room APA10/APA10. This chart was scribed by Lyndee Hensen. This patient's care was started at 5:45PM.     Chief Complaint  Patient presents with  . Dizziness   HPI Summer Lopez is a 40 y.o. female who presents to the Emergency Department complaining of  dizziness described as "spinning" with associated coughing productive (clear), fever ((102.0), bitemporal HA pain rated 8/10, right side facial rash, sore throat, and rhinorrhea onset 3 days ago.  Denies rash on hands.  Minimal relief by Tylenol, Vit C, Theraflu.   Notes hx of DM and recent HTN Dx.  PCP  Dr. Iona Beard at [facility] in [facility].   HPI ELEMENTS:  Onset:x3 days ago Duration: persistent since onset  Timing: constant, Quality:spinning   Severity: 8/10 Modifying factors:  Minimal relief by Tylenol, Vit C, Theraflu Context:  as above  Associated symptoms: coughing productive (clear), fever ((102.0), bitemporal HA pain rated 8/10, right side facial rash, sore throat, and rhinorrhea.  Denies rash on hands.    PAST MEDICAL HISTORY:  Past Medical History  Diagnosis Date  . Diabetes mellitus   . Hypertension   . Thyroid disease   . Migraine   . Enlarged heart     PAST SURGICAL HISTORY:  Past Surgical History  Procedure Date  . Abdominal hysterectomy   . Knee surgery   . Gastric bypass     MEDICATIONS:  Previous Medications   RABEPRAZOLE (ACIPHEX) 20 MG TABLET    Take 1 tablet (20 mg total) by mouth daily.     ALLERGIES:  Allergies as of 09/23/2010 - Review Complete 09/23/2010  Allergen Reaction Noted  . Meloxicam    . Metformin    . Morphine       FAMILY HISTORY:  Family History  Problem Relation Age of Onset  . Diabetes Mother   . Hypertension Mother   . Diabetes Father   . Hypertension Father      SOCIAL HISTORY: History   Social History  . Marital Status: Married      Spouse Name: N/A    Number of Children: N/A  . Years of Education: N/A   Social History Main Topics  . Smoking status: Never Smoker   . Smokeless tobacco: Never Used  . Alcohol Use: No  . Drug Use: No  . Sexually Active: Yes    Birth Control/ Protection: None   Other Topics Concern  . None   Social History Narrative  . None    Review of Systems 10 Systems reviewed and are negative for acute change except as noted in the HPI.  Physical Exam  BP 98/61  Pulse 68  Temp(Src) 98.3 F (36.8 C) (Oral)  Resp 18  Ht 5' 7"  (1.702 m)  Wt 350 lb (158.759 kg)  BMI 54.82 kg/m2  SpO2 100%  Physical Exam  Nursing note and vitals reviewed. Constitutional: She is oriented to person, place, and time. She appears well-developed and well-nourished.  HENT:  Head: Normocephalic and atraumatic.       Moderate tenderness over frontal and maxillary sinuses.     Eyes: EOM are normal. Pupils are equal, round, and reactive to light.  Neck: Neck supple.  Cardiovascular: Normal rate and regular rhythm.   No murmur heard. Pulmonary/Chest: Effort normal.  Abdominal: Soft. She exhibits no distension. There is no tenderness.  Musculoskeletal: Normal range  of motion.  Neurological: She is alert and oriented to person, place, and time.       Dizziness is produced by eye movement and head movement    Skin: Skin is warm and dry.  Psychiatric: She has a normal mood and affect. Her behavior is normal.    ED Course  Procedures OTHER DATA REVIEWED: Nursing notes, vital signs, and past medical records reviewed.   DIAGNOSTIC STUDIES: Oxygen Saturation is 100% on room air, normal by my interpretation.      MDM: Nursing notes reviewed.   IMPRESSION: Diagnoses that have been ruled out:  Diagnoses that are still under consideration:  Final diagnoses:  Sinusitis acute  Vertigo    PLAN:  Home Advised to return for worsening or additional problems such as abdominal or chest pain The  patient is to return the emergency department if there is any worsening of symptoms. I have reviewed the discharge instructions with the patient.    CONDITION ON DISCHARGE: Good   DISCHARGE MEDICATIONS: New Prescriptions   DOXYCYCLINE (VIBRAMYCIN) 100 MG CAPSULE    Take 1 capsule (100 mg total) by mouth 2 (two) times daily.   MECLIZINE (ANTIVERT) 25 MG TABLET    Take 1 tablet (25 mg total) by mouth 3 (three) times daily as needed.    I personally performed the services described in this documentation, which was scribed in my presence. The recorded information has been reviewed and considered.   Delora Fuel, MD 20/81/38 [id number]

## 2010-09-23 NOTE — ED Notes (Signed)
Patient also reports face and neck with itching, sore throat, coughing, and congestion, sinus pressure, headache.

## 2010-09-23 NOTE — Discharge Instructions (Signed)
Sinusitis Sinuses are air pockets within the bones of your face. The growth of bacteria within a sinus leads to infection. Infection keeps the sinuses from draining. This infection is called sinusitis. SYMPTOMS There will be different areas of pain depending on which sinuses have become infected.  The maxillary sinuses often produce pain beneath the eyes.   Frontal sinusitis may cause pain in the middle of the forehead and above the eyes.  Other problems (symptoms) include:  Toothaches.   Colored, pus-like (purulent) drainage from the nose.   Any swelling, warmth, or tenderness over the sinus areas may be signs of infection.  TREATMENT Sinusitis is most often determined by an exam and you may have x-rays taken. If x-rays have been taken, make sure you obtain your results. Or find out how you are to obtain them. Your caregiver may give you medications (antibiotics). These are medications that will help kill the infection. You may also be given a medication (decongestant) that helps to reduce sinus swelling.  HOME CARE INSTRUCTIONS  Only take over-the-counter or prescription medicines for pain, discomfort, or fever as directed by your caregiver.   Drink extra fluids. Fluids help thin the mucus so your sinuses can drain more easily.   Applying either moist heat or ice packs to the sinus areas may help relieve discomfort.   Use saline nasal sprays to help moisten your sinuses. The sprays can be found at your local drugstore.  SEEK IMMEDIATE MEDICAL CARE IF YOU DEVELOP:  High fever that is still present after two days of antibiotic treatment.   Increasing pain, severe headaches, or toothache.   Nausea, vomiting, or drowsiness.   Unusual swelling around the face or trouble seeing.  MAKE SURE YOU:   Understand these instructions.   Will watch your condition.   Will get help right away if you are not doing well or get worse.  Document Released: 01/15/2005 Document Re-Released:  12/29/2007 [facility] Patient Information 2011 [place].IMPORTANT: HOW TO USE THIS INFORMATION:  This is a summary and does NOT have all possible information about this product. This information does not assure that this product is safe, effective, or appropriate for you. This information is not individual medical advice and does not substitute for the advice of your health care professional. Always ask your health care professional for complete information about this product and your specific health needs.    MECLIZINE - ORAL (MECK-lih-zeen)    COMMON BRAND NAME(S): Antivert, D-vert, Dramamine II, Univert, Vertin    USES:  Meclizine is an antihistamine that is used to prevent or treat nausea, vomiting, and dizziness caused by motion sickness. It may also be used to reduce lightheadedness, dizziness, and loss of balance (vertigo) caused by diseases that affect the inner ear.    HOW TO USE:  Take this medication by mouth with or without food, or as directed by your doctor. To prevent motion sickness, take the first dose one hour before starting an activity such as travel. You may take another dose every 24 hours if needed. Chewable tablets must be chewed thoroughly before swallowing. Follow the directions on the label, or take as directed by your doctor. Do not take more medication than recommended. Ask your doctor or pharmacist if you have questions. For control of vertigo and other conditions, take as directed by your doctor. Your dosage is based on your medical condition and response to therapy. Inform your doctor if your condition does not improve or if it worsens.  SIDE EFFECTS:  Drowsiness, dry mouth, and tiredness may occur. If any of these effects persist or worsen, notify your doctor or pharmacist promptly. Some patients, particularly children, may experience excitability rather than drowsiness. If your doctor has directed you to use this medication, remember that he or she has judged that  the benefit to you is greater than the risk of side effects. Many people using this medication do not have serious side effects. Tell your doctor immediately if any of these unlikely but serious side effects occur: vision changes, decreased/painful urination, seizures. A very serious allergic reaction to this drug is unlikely, but seek immediate medical attention if it occurs. Symptoms of a serious allergic reaction may include: rash, itching/swelling (especially of the face/tongue/throat), severe dizziness, trouble breathing. This is not a complete list of possible side effects. If you notice other effects not listed above, contact your doctor or pharmacist. In the [place] - Call your doctor for medical advice about side effects. You may report side effects to FDA at 1-800-FDA-1088. In [place] - Call your doctor for medical advice about side effects. You may report side effects to [facility] [place] at [phone].    PRECAUTIONS:  Before taking meclizine, tell your doctor or pharmacist if you are allergic to it; or if you have any other allergies. This product may contain inactive ingredients, which can cause allergic reactions or other problems. Talk to your pharmacist for more details. Before using this medication, tell your doctor or pharmacist your medical history, especially of: breathing problems (e.g., asthma, emphysema), glaucoma, prostate problems, seizure disorder. This drug may make you drowsy or cause blurred vision. Do not drive, use machinery, or do any activity that requires alertness or clear vision until you are sure you can perform such activities safely. Limit alcoholic beverages. Caution is advised when using this drug in the elderly because they may be more sensitive to its effects, especially drowsiness. This medication should be used only when clearly needed during pregnancy. Discuss the risks and benefits with your doctor. It is unknown if this drug passes into breast milk. Consult your doctor  before breastfeeding.    DRUG INTERACTIONS:  Your healthcare professionals (e.g., doctor or pharmacist) may already be aware of any possible drug interactions and may be monitoring you for them. Do not start, stop or change the dosage of any medicine before checking with them first. Before using this medication, tell your doctor or pharmacist of all prescription and nonprescription/herbal products you may use, especially of: antihistamines applied to the skin (such as diphenhydramine cream, ointment, spray), antispasmodics (e.g., atropine, belladonna alkaloids), drugs for Parkinson's disease (e.g., anticholinergics such as benztropine, trihexyphenidyl), scopolamine, tricyclic antidepressants (e.g., amitriptyline). Tell your doctor or pharmacist if you also take drugs that cause drowsiness, such as certain antihistamines (e.g., diphenhydramine), anti-seizure drugs (e.g., carbamazepine), medicine for sleep or anxiety (e.g., alprazolam, diazepam, zolpidem), muscle relaxants, narcotic pain relievers (e.g., codeine), psychiatric medicines (e.g., chlorpromazine, risperidone, trazodone). Check the labels on all your medicines (e.g., cough-and-cold medicines, allergy products) because they may contain antihistamines (e.g., chlorpheniramine) or other ingredients that could cause drowsiness. Ask your pharmacist about the safe use of those products. This document does not contain all possible interactions. Therefore, before using this product, tell your doctor or pharmacist of all the products you use. Keep a list of all your medications with you, and share the list with your doctor and pharmacist.    OVERDOSE:  If overdose is suspected, contact your local poison control center or emergency room  immediately. [place] residents can call the [place] national poison hotline at 1-406-862-1070. [place] residents should call their local poison control center directly. Symptoms of overdose may include: mental/mood changes, extreme  drowsiness, loss of consciousness, seizures.    NOTES:  Do not share this medication with others.    MISSED DOSE:  If you miss a dose, take it as soon as you remember. If it is near the time of the next dose, skip the missed dose and resume your usual dosing schedule. Do not double the dose to catch up.    STORAGE:  Store at room temperature between 59-86 degrees F (15-30 degrees C) away from light and moisture. Brief storage up to 104 degrees F (40 degrees C) is permitted. Do not store in the bathroom. Keep all medicines away from children and pets. Do not flush medications down the toilet or pour them into a drain unless instructed to do so. Properly discard this product when it is expired or no longer needed. Consult your pharmacist or local waste disposal company for more details about how to safely discard your product.    Information last revised September 2010 Copyright(c) 2010 [employer].     Vertigo (Labyrinthitis, Dizziness) You have vertigo. This is the feeling (sensation) that you are moving when you are not. If these attacks occur when you are at work, driving, or performing other difficult activities, you may be injured or injure someone else. It may seem as though the world is spinning around or that you are falling to the ground. Because your balance is upset, vertigo can cause you to feel sick to your stomach (nausea) and vomiting. Vertigo may be caused by an infection, related to drug toxicity, or simply a rapid change of position such as lying down or rolling over in bed. There are many different causes of Vertigo.  HOME CARE INSTRUCTIONS  Follow your caregiver's instructions.   Avoid:   Driving.   Operating heavy machinery.   Performing other tasks that would be dangerous to you or others during an attack.   Tell your caregiver if you notice that certain medications seem to be causing vertigo. Some of the medications used to treat attacks can actually aggravate  them. Be aware of this and report to your caregiver should this occur.   If the problem you are having is positional, your caregiver may be able to give you instructions for movements and procedures that can help this.  SEEK IMMEDIATE MEDICAL CARE IF:  Medications do not relieve the attack or are making it worse.   You develop any of these changes:   You develop severe headaches.   No relief is obtained for nausea or vomiting or it becomes worse.   You develop visual changes.   A family member notices behavioral changes.   Your condition gets worse.  Document Released: 10/25/2004 Document Re-Released: 07/29/2006 [facility] Patient Information 2011 [place].  What is this medicine? DOXYCYCLINE (dox i SYE kleen) is a tetracycline antibiotic. It kills certain bacteria or stops their growth. It is used to treat many kinds of infections, like dental, skin, respiratory, and urinary tract infections. It also treats acne, Lyme disease, malaria, and certain sexually transmitted infections. This medicine may be used for other purposes; ask your health care provider or pharmacist if you have questions.   What should I tell my health care provider before I take this medicine? They need to know if you have any of these conditions: -liver disease -long  exposure to sunlight like working outdoors -stomach problems like colitis -an unusual or allergic reaction to doxycycline, tetracycline antibiotics, other medicines, foods, dyes, or preservatives -pregnant or trying to get pregnant -breast-feeding   How should I use this medicine? Take this medicine by mouth with a full glass of water. Follow the directions on the prescription label. It is best to take this medicine without food, but if it upsets your stomach take it with food. Take your medicine at regular intervals. Do not take your medicine more often than directed. Take all of your medicine as directed even if you think you are better. Do  not skip doses or stop your medicine early.   Talk to your pediatrician regarding the use of this medicine in children. Special care may be needed. While this drug may be prescribed for children as young as 76 years old for selected conditions, precautions do apply.   Overdosage: If you think you have taken too much of this medicine contact a poison control center or emergency room at once. NOTE: This medicine is only for you. Do not share this medicine with others.   What if I miss a dose? If you miss a dose, take it as soon as you can. If it is almost time for your next dose, take only that dose. Do not take double or extra doses.   What may interact with this medicine? -antacids -barbiturates -birth control pills -bismuth subsalicylate -carbamazepine -methoxyflurane -other antibiotics -phenytoin -vitamins that contain iron -warfarin   This list may not describe all possible interactions. Give your health care provider a list of all the medicines, herbs, non-prescription drugs, or dietary supplements you use. Also tell them if you smoke, drink alcohol, or use illegal drugs. Some items may interact with your medicine.   What should I watch for while using this medicine? Tell your doctor or health care professional if your symptoms do not improve.   Do not treat diarrhea with over the counter products. Contact your doctor if you have diarrhea that lasts more than 2 days or if it is severe and watery.   Do not take this medicine just before going to bed. It may not dissolve properly when you lay down and can cause pain in your throat. Drink plenty of fluids while taking this medicine to also help reduce irritation in your throat.   This medicine can make you more sensitive to the sun. Keep out of the sun. If you cannot avoid being in the sun, wear protective clothing and use sunscreen. Do not use sun lamps or tanning beds/booths.   Birth control pills may not work properly while you  are taking this medicine. Talk to your doctor about using an extra method of birth control.   If you are being treated for a sexually transmitted infection, avoid sexual contact until you have finished your treatment. Your sexual partner may also need treatment.   Avoid antacids, aluminum, calcium, magnesium, and iron products for 4 hours before and 2 hours after taking a dose of this medicine.   If you are using this medicine to prevent malaria, you should still protect yourself from contact with mosquitos. Stay in screened-in areas, use mosquito nets, keep your body covered, and use an insect repellent.   What side effects may I notice from receiving this medicine? Side effects that you should report to your doctor or health care professional as soon as possible: -allergic reactions like skin rash, itching or hives, swelling of the  face, lips, or tongue -difficulty breathing -fever -itching in the rectal or genital area -pain on swallowing -redness, blistering, peeling or loosening of the skin, including inside the mouth -severe stomach pain or cramps -unusual bleeding or bruising -unusually weak or tired -yellowing of the eyes or skin   Side effects that usually do not require medical attention (report to your doctor or health care professional if they continue or are bothersome): -diarrhea -loss of appetite -nausea, vomiting   This list may not describe all possible side effects. Call your doctor for medical advice about side effects. You may report side effects to FDA at 1-800-FDA-1088.   Where should I keep my medicine? Keep out of the reach of children.   Store at room temperature, below 30 degrees C (86 degrees F). Protect from light. Keep container tightly closed. Throw away any unused medicine after the expiration date. Taking this medicine after the expiration date can make you seriously ill.   NOTE:This sheet is a summary. It may not cover all possible information. If you  have questions about this medicine, talk to your doctor, pharmacist, or health care provider.      2011, [employer].

## 2010-11-10 LAB — BASIC METABOLIC PANEL WITH GFR
Chloride: 102
Creatinine, Ser: 0.68
GFR calc Af Amer: >60
Potassium: 4.1
Sodium: 136

## 2010-11-10 LAB — CBC
HCT: 39.8
Hemoglobin: 12.9
MCHC: 32.5
MCV: 87.8
Platelets: 232
RBC: 4.53
RDW: 13.9
WBC: 9.7

## 2010-11-10 LAB — BASIC METABOLIC PANEL
BUN: 4 — ABNORMAL LOW
CO2: 28
Calcium: 8.6
GFR calc non Af Amer: >60
Glucose, Bld: 155 — ABNORMAL HIGH

## 2010-11-10 LAB — DIFFERENTIAL
Basophils Absolute: 0.1
Basophils Relative: 1
Eosinophils Absolute: 0.1
Eosinophils Relative: 2
Lymphocytes Relative: 18
Lymphs Abs: 1.8
Monocytes Absolute: 0.6
Monocytes Relative: 7
Neutro Abs: 7.1
Neutrophils Relative %: 73

## 2010-11-10 LAB — STREP A DNA PROBE: Group A Strep Probe: POSITIVE

## 2010-11-10 LAB — RAPID STREP SCREEN (MED CTR MEBANE ONLY): Streptococcus, Group A Screen (Direct): NEGATIVE

## 2010-12-03 ENCOUNTER — Emergency Department (HOSPITAL_COMMUNITY)
Admission: EM | Admit: 2010-12-03 | Discharge: 2010-12-03 | Disposition: A | Discharge status: Home / Self Care | Payer: Self-pay | Attending: Emergency Medicine | Admitting: Emergency Medicine

## 2010-12-03 ENCOUNTER — Encounter (HOSPITAL_COMMUNITY): Payer: Self-pay

## 2010-12-03 DIAGNOSIS — E079 Disorder of thyroid, unspecified: Secondary | ICD-10-CM | POA: Insufficient documentation

## 2010-12-03 DIAGNOSIS — I517 Cardiomegaly: Secondary | ICD-10-CM | POA: Insufficient documentation

## 2010-12-03 DIAGNOSIS — Z9079 Acquired absence of other genital organ(s): Secondary | ICD-10-CM | POA: Insufficient documentation

## 2010-12-03 DIAGNOSIS — Z9884 Bariatric surgery status: Secondary | ICD-10-CM | POA: Insufficient documentation

## 2010-12-03 DIAGNOSIS — E119 Type 2 diabetes mellitus without complications: Secondary | ICD-10-CM | POA: Insufficient documentation

## 2010-12-03 DIAGNOSIS — L509 Urticaria, unspecified: Secondary | ICD-10-CM | POA: Insufficient documentation

## 2010-12-03 DIAGNOSIS — I10 Essential (primary) hypertension: Secondary | ICD-10-CM | POA: Insufficient documentation

## 2010-12-03 DIAGNOSIS — G43909 Migraine, unspecified, not intractable, without status migrainosus: Secondary | ICD-10-CM | POA: Insufficient documentation

## 2010-12-03 MED ORDER — HYDROXYZINE HCL 25 MG PO TABS: 25.0000 mg | ORAL_TABLET | Freq: Four times a day (QID) | ORAL | Status: AC | PRN

## 2010-12-03 MED ORDER — PREDNISONE 20 MG PO TABS: 20.0000 mg | ORAL_TABLET | Freq: Every day | ORAL | Status: AC

## 2010-12-03 MED ORDER — PREDNISONE 20 MG PO TABS
60.0000 mg | ORAL_TABLET | Freq: Once | ORAL | Status: AC
  Administered 2010-12-03: 60 mg via ORAL
  Filled 2010-12-03: qty 3

## 2010-12-03 MED ORDER — HYDROXYZINE HCL 25 MG PO TABS
50.0000 mg | ORAL_TABLET | Freq: Once | ORAL | Status: AC
  Administered 2010-12-03: 50 mg via ORAL
  Filled 2010-12-03: qty 2

## 2010-12-03 NOTE — ED Notes (Signed)
Pt presents with c/o generalized hives. Pt states they started 2 days ago and she has been taking benadryl and cortisone cream with no relief.

## 2010-12-03 NOTE — ED Provider Notes (Signed)
History     CSN: [id number] Arrival date & time: 12/03/2010  8:10 PM    Chief Complaint  Patient presents with  . Urticaria   HPI Pt was seen at 2020.  Per pt, c/o gradual onset and persistence of constant diffuse "itchy red rash" x2 days.  States she has been using OTC benadryl and "steroid cream" without relief.  No others in household with rash. Denies SOB/wheezing, no dysphagia/sore throat, no fevers.    Past Medical History  Diagnosis Date  . Diabetes mellitus   . Hypertension   . Thyroid disease   . Migraine   . Enlarged heart     Past Surgical History  Procedure Date  . Abdominal hysterectomy   . Knee surgery   . Gastric bypass     Family History  Problem Relation Age of Onset  . Diabetes Mother   . Hypertension Mother   . Diabetes Father   . Hypertension Father     History  Substance Use Topics  . Smoking status: Never Smoker   . Smokeless tobacco: Never Used  . Alcohol Use: No    OB History    Grav Para Term Preterm Abortions TAB SAB Ect Mult Living   4 2 2  2  2   2       Review of Systems ROS: Statement: All systems negative except as marked or noted in the HPI; Constitutional: Negative for fever and chills. ; ; Eyes: Negative for eye pain, redness and discharge. ; ; ENMT: Negative for ear pain, hoarseness, nasal congestion, sinus pressure and sore throat. ; ; Cardiovascular: Negative for chest pain, palpitations, diaphoresis, dyspnea and peripheral edema. ; ; Respiratory: Negative for cough, wheezing and stridor. ; ; Gastrointestinal: Negative for nausea, vomiting, diarrhea and abdominal pain, blood in stool, hematemesis, jaundice and rectal bleeding. . ; ; Genitourinary: Negative for dysuria, flank pain and hematuria. ; ; Musculoskeletal: Negative for back pain and neck pain. Negative for swelling and trauma.; ; Skin: Positive for pruritus, rash. Negative for abrasions, blisters, bruising and skin lesion.; ; Neuro: Negative for headache, lightheadedness  and neck stiffness. Negative for weakness, altered level of consciousness , altered mental status, extremity weakness, paresthesias, involuntary movement, seizure and syncope.    Allergies  Meloxicam; Metformin; and Morphine  Home Medications   Current Outpatient Rx  Name Route Sig Dispense Refill  . RABEPRAZOLE SODIUM 20 MG PO TBEC Oral Take 1 tablet (20 mg total) by mouth daily. 18 tablet 0    Samples given to patient    Lot#   [id number], [id number], ...    BP 129/72  Pulse 90  Temp(Src) 98 F (36.7 C) (Oral)  Resp 20  Ht 5' 7"  (1.702 m)  Wt 350 lb (158.759 kg)  BMI 54.82 kg/m2  SpO2 96%  Physical Exam 2025: Physical examination:  Nursing notes reviewed; Vital signs and O2 SAT reviewed;  Constitutional: Well developed, Well nourished, Well hydrated, In no acute distress; Head:  Normocephalic, atraumatic; Eyes: EOMI, PERRL, No scleral icterus; ENMT: Mouth and pharynx normal, Mucous membranes moist, no intra-oral edema, no hoarse voice, no drooling or stridor; Neck: Supple, Full range of motion,; Cardiovascular: Regular rate and rhythm, Respiratory: Breath sounds clear bilaterally, No wheezing or stridor, Normal respiratory effort/excursion; Chest: No deformity, Movement normal; Extremities: No deformity, No edema, No calf edema or asymmetry.; Neuro: AA&Ox3, Major CN grossly intact.  No gross focal motor or sensory deficits in extremities.; Skin: Color normal, Warm, Dry, +diffuse macular rash with excoriations.  ED Course  Procedures   MDM  MDM Reviewed: nursing note and vitals   Does not appear to be scabies.  Will tx symptomatically at this time. Pt verb understanding.        Freer, DO 12/04/10 1145

## 2010-12-03 NOTE — Discharge Instructions (Signed)
 Allergic Reaction, Mild to Moderate Allergies may happen from anything your body is sensitive to. This may be food, medications, pollens, chemicals, and nearly anything around you in everyday life that produces allergens. An allergen is anything that causes an allergy producing substance. Allergens cause your body to release allergic antibodies. Through a chain of events, they cause a release of histamine into the blood stream. Histamines are meant to protect you, but they also cause your discomfort. This is why antihistamines are often used for allergies. Heredity is often a factor in causing allergic reactions. This means you may have some of the same allergies as your parents. Allergies happen in all age groups. You may have some idea of what caused your reaction. There are many allergens around us . It may be difficult to know what caused your reaction. If this is a first time event, it may never happen again. Allergies cannot be cured but can be controlled with medications. SYMPTOMS  You may get some or all of the following problems from allergies.  Swelling and itching in and around the mouth.   Tearing, itchy eyes.   Nasal congestion and runny nose.   Sneezing and coughing.   An itchy red rash or hives.   Vomiting or diarrhea.   Difficulty breathing.  Seasonal allergies occur in all age groups. They are seasonal because they usually occur during the same season every year. They may be a reaction to molds, grass pollens, or tree pollens. Other causes of allergies are house dust mite allergens, pet dander and mold spores. These are just a common few of the thousands of allergens around us . All of the symptoms listed above happen when you come in contact with pollens and other allergens. Seasonal allergies are usually not life threatening. They are generally more of a nuisance that can often be handled using medications. Hay fever is a combination of all or some of the above listed allergy  problems. It may often be treated with simple over-the-counter medications such as diphenhydramine. Take medication as directed. Check with your caregiver or package insert for child dosages. TREATMENT AND HOME CARE INSTRUCTIONS If hives or rash are present:  Take medications as directed.   You may use an over-the-counter antihistamine (diphenhydramine) for hives and itching as needed. Do not drive or drink alcohol until medications used to treat the reaction have worn off. Antihistamines tend to make people sleepy.   Apply cold cloths (compresses) to the skin or take baths in cool water. This will help itching. Avoid hot baths or showers. Heat will make a rash and itching worse.   If your allergies persist and become more severe, and over the counter medications are not effective, there are many new medications your caretaker can prescribe. Immunotherapy or desensitizing injections can be used if all else fails. Follow up with your caregiver if problems continue.  SEEK MEDICAL CARE IF:   Your allergies are becoming progressively more troublesome.   You suspect a food allergy. Symptoms generally happen within 30 minutes of eating a food.   Your symptoms have not gone away within 2 days or are getting worse.   You develop new symptoms.   You want to retest yourself or your child with a food or drink you think causes an allergic reaction. Never test yourself or your child of a suspected allergy without being under the watchful eye of your caregivers. A second exposure to an allergen may be life-threatening.  SEEK IMMEDIATE MEDICAL CARE IF:  You  develop difficulty breathing or wheezing, or have a tight feeling in your chest or throat.   You develop a swollen mouth, hives, swelling, or itching all over your body.  A severe reaction with any of the above problems should be considered life-threatening. If you suddenly develop difficulty breathing call for local emergency medical help. THIS IS AN  EMERGENCY. MAKE SURE YOU:   Understand these instructions.   Will watch your condition.   Will get help right away if you are not doing well or get worse.  Document Released: 11/12/2006 Document Revised: 09/27/2010 Document Reviewed: 11/12/2006 [facility] Patient Information 2012 [place], [place].   Take the prescriptions as directed.  Call your regular medical doctor tomorrow morning to schedule a follow up appointment this week.  Return to the Emergency Department immediately sooner if worsening.

## 2010-12-08 ENCOUNTER — Encounter (HOSPITAL_COMMUNITY): Payer: Self-pay | Admitting: *Deleted

## 2010-12-08 ENCOUNTER — Emergency Department (HOSPITAL_COMMUNITY)
Admission: EM | Admit: 2010-12-08 | Discharge: 2010-12-09 | Disposition: A | Discharge status: Home / Self Care | Payer: Self-pay | Attending: Emergency Medicine | Admitting: Emergency Medicine

## 2010-12-08 DIAGNOSIS — R002 Palpitations: Secondary | ICD-10-CM | POA: Insufficient documentation

## 2010-12-08 DIAGNOSIS — E119 Type 2 diabetes mellitus without complications: Secondary | ICD-10-CM | POA: Insufficient documentation

## 2010-12-08 DIAGNOSIS — E079 Disorder of thyroid, unspecified: Secondary | ICD-10-CM | POA: Insufficient documentation

## 2010-12-08 DIAGNOSIS — R110 Nausea: Secondary | ICD-10-CM | POA: Insufficient documentation

## 2010-12-08 DIAGNOSIS — L299 Pruritus, unspecified: Secondary | ICD-10-CM | POA: Insufficient documentation

## 2010-12-08 DIAGNOSIS — I10 Essential (primary) hypertension: Secondary | ICD-10-CM | POA: Insufficient documentation

## 2010-12-08 DIAGNOSIS — R5381 Other malaise: Secondary | ICD-10-CM | POA: Insufficient documentation

## 2010-12-08 DIAGNOSIS — R5383 Other fatigue: Secondary | ICD-10-CM | POA: Insufficient documentation

## 2010-12-08 DIAGNOSIS — R21 Rash and other nonspecific skin eruption: Secondary | ICD-10-CM | POA: Insufficient documentation

## 2010-12-08 DIAGNOSIS — R51 Headache: Secondary | ICD-10-CM | POA: Insufficient documentation

## 2010-12-08 DIAGNOSIS — Z79899 Other long term (current) drug therapy: Secondary | ICD-10-CM | POA: Insufficient documentation

## 2010-12-08 LAB — CBC
HCT: 37.4 % (ref 36.0–46.0)
Hemoglobin: 11.8 g/dL — ABNORMAL LOW (ref 12.0–15.0)
MCH: 27.1 pg (ref 26.0–34.0)
MCHC: 31.6 g/dL (ref 30.0–36.0)
MCV: 86 fL (ref 78.0–100.0)
Platelets: 351 10*3/uL (ref 150–400)
RBC: 4.35 MIL/uL (ref 3.87–5.11)
RDW: 14.3 % (ref 11.5–15.5)
WBC: 10.7 10*3/uL — ABNORMAL HIGH (ref 4.0–10.5)

## 2010-12-08 LAB — BASIC METABOLIC PANEL
CO2: 29 mEq/L (ref 19–32)
Chloride: 100 mEq/L (ref 96–112)
Creatinine, Ser: 0.92 mg/dL (ref 0.50–1.10)
GFR calc Af Amer: 89 mL/min — ABNORMAL LOW (ref >90)
Potassium: 4.4 mEq/L (ref 3.5–5.1)
Sodium: 136 mEq/L (ref 135–145)

## 2010-12-08 LAB — BASIC METABOLIC PANEL WITH GFR
BUN: 13 mg/dL (ref 6–23)
Calcium: 9.5 mg/dL (ref 8.4–10.5)
GFR calc non Af Amer: 77 mL/min — ABNORMAL LOW (ref >90)
Glucose, Bld: 69 mg/dL — ABNORMAL LOW (ref 70–99)

## 2010-12-08 LAB — GLUCOSE, CAPILLARY: Glucose-Capillary: 69 mg/dL — ABNORMAL LOW (ref 70–99)

## 2010-12-08 MED ORDER — FAMOTIDINE IN NACL 20-0.9 MG/50ML-% IV SOLN
20.0000 mg | Freq: Once | INTRAVENOUS | Status: AC
  Administered 2010-12-08: 20 mg via INTRAVENOUS
  Filled 2010-12-08: qty 50

## 2010-12-08 MED ORDER — FENTANYL CITRATE 0.05 MG/ML IJ SOLN
50.0000 ug | Freq: Once | INTRAMUSCULAR | Status: AC
  Administered 2010-12-08: 23:00:00 via INTRAVENOUS
  Filled 2010-12-08: qty 2

## 2010-12-08 MED ORDER — SODIUM CHLORIDE 0.9 % IV SOLN
Freq: Once | INTRAVENOUS | Status: AC
  Administered 2010-12-08: 23:00:00 via INTRAVENOUS

## 2010-12-08 MED ORDER — HYDROXYZINE HCL 50 MG/ML IM SOLN
25.0000 mg | Freq: Once | INTRAMUSCULAR | Status: AC
  Administered 2010-12-08: 23:00:00 via INTRAMUSCULAR
  Filled 2010-12-08: qty 1

## 2010-12-08 NOTE — ED Provider Notes (Signed)
History     CSN: [id number] Arrival date & time: 12/08/2010 10:00 PM   First MD Initiated Contact with Patient 12/08/10 2208      Chief Complaint  Patient presents with  . Rash  . Palpitations  . Headache  . Nausea  . Weakness    (Consider location/radiation/quality/duration/timing/severity/associated sxs/prior treatment) HPI Comments: Pt states she began itching about 1 wk ago. No new foods, meds, or soaps. She did wear a new jogging suit without washing it first. She was seen in the ED 6 days ago and treated with hydroxyzine. She return now stating that she can not stop itching. The water from the shower makes her itch. Benadryl and hydroxyzine are not helping.  Patient is a 40 y.o. female presenting with rash, palpitations, headaches, and weakness. The history is provided by the patient.  Rash  This is a recurrent problem. The current episode started more than 1 week ago. The problem has been gradually worsening. The problem is associated with nothing. There has been no fever. The rash is present on the neck, back, abdomen, groin, left upper leg, left lower leg, right upper leg and right lower leg. The patient is experiencing no pain. Associated symptoms include itching. Pertinent negatives include no blisters and no weeping. She has tried antihistamines, anti-itch cream and steriods for the symptoms. The treatment provided no relief.  Palpitations  Associated symptoms include headaches and weakness. Pertinent negatives include no chest pain, no abdominal pain, no back pain, no dizziness, no cough and no shortness of breath.  Headache  Associated symptoms include palpitations. Pertinent negatives include no shortness of breath.  Weakness The primary symptoms include headaches. Primary symptoms do not include seizures or dizziness.  The headache is associated with weakness. The headache is not associated with photophobia.  Additional symptoms include weakness. Additional symptoms do  not include photophobia or hallucinations.    Past Medical History  Diagnosis Date  . Diabetes mellitus   . Hypertension   . Thyroid disease   . Migraine   . Enlarged heart     Past Surgical History  Procedure Date  . Abdominal hysterectomy   . Knee surgery   . Gastric bypass     Family History  Problem Relation Age of Onset  . Diabetes Mother   . Hypertension Mother   . Diabetes Father   . Hypertension Father     History  Substance Use Topics  . Smoking status: Never Smoker   . Smokeless tobacco: Never Used  . Alcohol Use: No    OB History    Grav Para Term Preterm Abortions TAB SAB Ect Mult Living   4 2 2  2  2   2       Review of Systems  Constitutional: Negative for activity change.       All ROS Neg except as noted in HPI  HENT: Negative for nosebleeds and neck pain.   Eyes: Negative for photophobia and discharge.  Respiratory: Negative for cough, shortness of breath and wheezing.   Cardiovascular: Positive for palpitations. Negative for chest pain.  Gastrointestinal: Negative for abdominal pain and blood in stool.  Genitourinary: Negative for dysuria, frequency and hematuria.  Musculoskeletal: Negative for back pain and arthralgias.  Skin: Positive for itching and rash.  Neurological: Positive for weakness and headaches. Negative for dizziness, seizures and speech difficulty.  Psychiatric/Behavioral: Negative for hallucinations and confusion.    Allergies  Meloxicam; Metformin; and Morphine  Home Medications   Current Outpatient Rx  Name Route Sig Dispense Refill  . VITAMIN D PO Oral Take 1 tablet by mouth daily.      . ENALAPRIL MALEATE PO Oral Take 1 tablet by mouth daily.      Marland Kitchen FERROUS SULFATE 325 (65 FE) MG PO TABS Oral Take 325 mg by mouth daily.      Marland Kitchen HYDROXYZINE HCL 25 MG PO TABS Oral Take 1 tablet (25 mg total) by mouth every 6 (six) hours as needed for itching. 12 tablet 0  . LEVOTHYROXINE SODIUM 125 MCG PO TABS Oral Take 125 mcg by  mouth daily.      Marland Kitchen METFORMIN HCL 500 MG PO TABS Oral Take 500 mg by mouth 2 (two) times daily with a meal.      . METOPROLOL SUCCINATE 25 MG PO TB24 Oral Take 25 mg by mouth daily.      Marland Kitchen PREDNISONE 20 MG PO TABS Oral Take 1 tablet (20 mg total) by mouth daily. 10 tablet 0    BP 134/73  Pulse 99  Temp 98.7 F (37.1 C)  Resp 20  Ht 5' 7"  (1.702 m)  Wt 350 lb (158.759 kg)  BMI 54.82 kg/m2  SpO2 99%  Physical Exam  Nursing note and vitals reviewed. Constitutional: She is oriented to person, place, and time. She appears well-developed and well-nourished.  Non-toxic appearance.  HENT:  Head: Normocephalic.  Right Ear: Tympanic membrane and external ear normal.  Left Ear: Tympanic membrane and external ear normal.  Eyes: EOM and lids are normal. Pupils are equal, round, and reactive to light.  Neck: Normal range of motion. Neck supple. Carotid bruit is not present.  Cardiovascular: Normal rate, regular rhythm, normal heart sounds, intact distal pulses and normal pulses.   Pulmonary/Chest: Breath sounds normal. No respiratory distress.  Abdominal: Soft. Bowel sounds are normal. There is no tenderness. There is no guarding.  Musculoskeletal: Normal range of motion.  Lymphadenopathy:       Head (right side): No submandibular adenopathy present.       Head (left side): No submandibular adenopathy present.    She has no cervical adenopathy.  Neurological: She is alert and oriented to person, place, and time. She has normal strength. No cranial nerve deficit or sensory deficit. She exhibits normal muscle tone. Coordination and gait normal. GCS eye subscore is 4. GCS verbal subscore is 5. GCS motor subscore is 6.  Skin: Skin is warm and dry. Rash noted.  Psychiatric: She has a normal mood and affect. Her speech is normal.    ED Course: 12:20 - Pt states she is still itching after IM vistaril and IV pepcid. She also states the headache is not improving after IV fentanyl. Pt seen by Dr  Sabra Heck. Pain improved after 2nd dose of pain and nausea medication. Itching still present. Plan discussed with pt.  Procedures (including critical care time)  Labs Reviewed - No data to display No results found.   Dx: 1. Rash   2. Headache   MDM  I have reviewed nursing notes, vital signs, and all appropriate lab and imaging results for this patient. Lab reviewed. Previous charts reviewed. Vital signs remain stable. It is safe for pt to be discharged home. She has been advised to see a dermatologist for evaluation. Results for orders placed during the hospital encounter of 12/08/10  CBC      Component Value Range   WBC 10.7 (*) 4.0 - 10.5 (K/uL)   RBC 4.35  3.87 - 5.11 (MIL/uL)  Hemoglobin 11.8 (*) 12.0 - 15.0 (g/dL)   HCT 37.4  36.0 - 46.0 (%)   MCV 86.0  78.0 - 100.0 (fL)   MCH 27.1  26.0 - 34.0 (pg)   MCHC 31.6  30.0 - 36.0 (g/dL)   RDW 14.3  11.5 - 15.5 (%)   Platelets 351  150 - 400 (K/uL)  BASIC METABOLIC PANEL      Component Value Range   Sodium 136  135 - 145 (mEq/L)   Potassium 4.4  3.5 - 5.1 (mEq/L)   Chloride 100  96 - 112 (mEq/L)   CO2 29  19 - 32 (mEq/L)   Glucose, Bld 69 (*) 70 - 99 (mg/dL)   BUN 13  6 - 23 (mg/dL)   Creatinine, Ser 0.92  0.50 - 1.10 (mg/dL)   Calcium 9.5  8.4 - 10.5 (mg/dL)   GFR calc non Af Amer 77 (*) >90 (mL/min)   GFR calc Af Amer 89 (*) >90 (mL/min)  GLUCOSE, CAPILLARY      Component Value Range   Glucose-Capillary 69 (*) 70 - 99 (mg/dL)   No results found.        Lenox Ahr, [place] 12/09/10 [phone]

## 2010-12-08 NOTE — ED Notes (Signed)
Sinus rhythm.  Alert, skin warm and dry.

## 2010-12-08 NOTE — ED Notes (Signed)
Given crackers and cola , tol well.

## 2010-12-08 NOTE — ED Notes (Signed)
It ching rash, seen here for same.  Nausea no vomiting.  Alert.  Has been taking prednisone.

## 2010-12-08 NOTE — ED Notes (Signed)
Pt c/o headache, nausea,  palpitations, rash (was seen here on Sunday/Monday for rash and pt states the itching has gotten worse instead of better), and just feels really tired.

## 2010-12-09 MED ORDER — PROMETHAZINE HCL 25 MG PO TABS: 25.0000 mg | ORAL_TABLET | Freq: Four times a day (QID) | ORAL | Status: DC | PRN

## 2010-12-09 MED ORDER — PROMETHAZINE HCL 25 MG/ML IJ SOLN
12.5000 mg | Freq: Once | INTRAMUSCULAR | Status: AC
  Administered 2010-12-09: 02:00:00 via INTRAVENOUS
  Filled 2010-12-09: qty 1

## 2010-12-09 MED ORDER — HYDROCODONE-ACETAMINOPHEN 10-325 MG PO TABS: 1.0000 | ORAL_TABLET | ORAL | Status: AC | PRN

## 2010-12-09 MED ORDER — FENTANYL CITRATE 0.05 MG/ML IJ SOLN
100.0000 ug | Freq: Once | INTRAMUSCULAR | Status: AC
  Administered 2010-12-09: 100 ug via INTRAVENOUS
  Filled 2010-12-09: qty 2

## 2010-12-09 MED ORDER — TRIAMCINOLONE ACETONIDE 0.1 % EX CREA: TOPICAL_CREAM | Freq: Two times a day (BID) | CUTANEOUS | Status: DC

## 2010-12-09 MED ORDER — PREDNISONE 10 MG PO TABS: 20.0000 mg | ORAL_TABLET | Freq: Every day | ORAL | Status: AC

## 2010-12-09 NOTE — ED Notes (Signed)
Headache improved, still feels itchy

## 2010-12-09 NOTE — ED Provider Notes (Signed)
Medical screening examination/treatment/procedure(s) were conducted as a shared visit with non-physician practitioner(s) and myself.  I personally evaluated the patient during the encounter  Rash covering the back, abdomen, extremities. Seen approximately one week ago for same and started on prednisone with no improvement. Patient has been using antihistamines without improvement. She denies fevers, swelling, spreading redness.  Physical exam patient has unroofed papules on her thoracic area both posteriorly and anteriorly, abdominal wall, bilateral buttocks laterally, no lesions intertriginous, no lesions at the beltline, no lesions on the face or neck.  Assessment nonspecific rash with pruritus  Plan: Undifferentiated rash, unlikely to be scabies as the patient has multiple close contacts which have similar symptoms, no signs of cellulitis or impetigo, no urticaria. Will treat with doxycycline as another option and have followup with referral to dermatology.  Summer Acosta, MD 12/09/10 [phone]

## 2010-12-09 NOTE — Discharge Instructions (Signed)
 Please see Dr Del Favia or dermatologist of your choice concerning your rash and itching. Please use prednisone  and triamcinolone  daily. Promethazine  for nausea, and Norco for headache. These medications may cause drowsiness, use with caution.

## 2011-03-14 ENCOUNTER — Emergency Department (HOSPITAL_COMMUNITY): Payer: Self-pay

## 2011-03-14 ENCOUNTER — Other Ambulatory Visit: Payer: Self-pay

## 2011-03-14 ENCOUNTER — Emergency Department (HOSPITAL_COMMUNITY)
Admission: EM | Admit: 2011-03-14 | Discharge: 2011-03-14 | Disposition: A | Discharge status: Home / Self Care | Payer: Self-pay | Attending: Emergency Medicine | Admitting: Emergency Medicine

## 2011-03-14 ENCOUNTER — Encounter (HOSPITAL_COMMUNITY): Payer: Self-pay | Admitting: Emergency Medicine

## 2011-03-14 DIAGNOSIS — E669 Obesity, unspecified: Secondary | ICD-10-CM | POA: Insufficient documentation

## 2011-03-14 DIAGNOSIS — R0789 Other chest pain: Secondary | ICD-10-CM | POA: Insufficient documentation

## 2011-03-14 DIAGNOSIS — Z6841 Body Mass Index (BMI) 40.0 and over, adult: Secondary | ICD-10-CM | POA: Insufficient documentation

## 2011-03-14 DIAGNOSIS — E079 Disorder of thyroid, unspecified: Secondary | ICD-10-CM | POA: Insufficient documentation

## 2011-03-14 DIAGNOSIS — E119 Type 2 diabetes mellitus without complications: Secondary | ICD-10-CM | POA: Insufficient documentation

## 2011-03-14 DIAGNOSIS — G43909 Migraine, unspecified, not intractable, without status migrainosus: Secondary | ICD-10-CM | POA: Insufficient documentation

## 2011-03-14 DIAGNOSIS — I10 Essential (primary) hypertension: Secondary | ICD-10-CM | POA: Insufficient documentation

## 2011-03-14 LAB — CBC
HCT: 37.4 % (ref 36.0–46.0)
Hemoglobin: 11.7 g/dL — ABNORMAL LOW (ref 12.0–15.0)
MCH: 27.1 pg (ref 26.0–34.0)
MCHC: 31.3 g/dL (ref 30.0–36.0)
MCV: 86.6 fL (ref 78.0–100.0)
Platelets: 305 10*3/uL (ref 150–400)
RBC: 4.32 MIL/uL (ref 3.87–5.11)
RDW: 13.9 % (ref 11.5–15.5)
WBC: 6.6 10*3/uL (ref 4.0–10.5)

## 2011-03-14 LAB — D-DIMER, QUANTITATIVE: D-Dimer, Quant: 0.53 ug/mL-FEU — ABNORMAL HIGH (ref 0.00–0.48)

## 2011-03-14 LAB — DIFFERENTIAL
Basophils Absolute: 0.1 10*3/uL (ref 0.0–0.1)
Basophils Relative: 1 % (ref 0–1)
Eosinophils Absolute: 0.3 10*3/uL (ref 0.0–0.7)
Eosinophils Relative: 4 % (ref 0–5)
Lymphocytes Relative: 45 % (ref 12–46)
Lymphs Abs: 3 10*3/uL (ref 0.7–4.0)
Monocytes Absolute: 0.3 10*3/uL (ref 0.1–1.0)
Monocytes Relative: 5 % (ref 3–12)
Neutro Abs: 3 10*3/uL (ref 1.7–7.7)
Neutrophils Relative %: 45 % (ref 43–77)

## 2011-03-14 LAB — BASIC METABOLIC PANEL WITH GFR
Calcium: 9.9 mg/dL (ref 8.4–10.5)
GFR calc non Af Amer: 86 mL/min — ABNORMAL LOW (ref >90)
Glucose, Bld: 254 mg/dL — ABNORMAL HIGH (ref 70–99)
Sodium: 136 meq/L (ref 135–145)

## 2011-03-14 LAB — BASIC METABOLIC PANEL
BUN: 7 mg/dL (ref 6–23)
CO2: 28 mEq/L (ref 19–32)
Chloride: 100 mEq/L (ref 96–112)
Creatinine, Ser: 0.84 mg/dL (ref 0.50–1.10)
GFR calc Af Amer: >90 mL/min (ref >90)
Potassium: 4.2 mEq/L (ref 3.5–5.1)

## 2011-03-14 LAB — TROPONIN I: Troponin I: <0.3 ng/mL (ref <0.30)

## 2011-03-14 MED ORDER — SODIUM CHLORIDE 0.9 % IV SOLN
Freq: Once | INTRAVENOUS | Status: AC
  Administered 2011-03-14: 1000 mL via INTRAVENOUS

## 2011-03-14 MED ORDER — IOHEXOL 350 MG/ML SOLN
100.0000 mL | Freq: Once | INTRAVENOUS | Status: AC | PRN
  Administered 2011-03-14: 100 mL via INTRAVENOUS

## 2011-03-14 MED ORDER — KETOROLAC TROMETHAMINE 30 MG/ML IJ SOLN
30.0000 mg | Freq: Once | INTRAMUSCULAR | Status: AC
  Administered 2011-03-14: 30 mg via INTRAVENOUS
  Filled 2011-03-14: qty 1

## 2011-03-14 NOTE — ED Notes (Signed)
Pt c/o ?anxiety attack. Pt states she is under a lot of stress. Pt states she is having right sided cp and hyperventilating.

## 2011-03-14 NOTE — ED Provider Notes (Signed)
History    This chart was scribed for Delora Fuel, MD, MD by Rhae Lerner. The patient was seen in room APA19 and the patient's care was started at 1:01PM.   CSN: [id number]  Arrival date & time 03/14/11  1117   First MD Initiated Contact with Patient 03/14/11 1256      Chief Complaint  Patient presents with  . Panic Attack    (Consider location/radiation/quality/duration/timing/severity/associated sxs/prior treatment) The history is provided by the patient.   Summer Lopez is a 41 y.o. female who presents to the Emergency Department complaining of panic attack onset 2 hours ago today. Pt reports that she has dull achy chest pain of 10/10. Pt reports that the pain radiates to shoulder. The pain has been constant since onset. Pt reports she was crying and arguing upon onset. She reports having SOB. She denies nausea, fever, vomiting and cough. The pt says breathing exercise helped alleviate anxiety. Pt has history of enlarged heart, HTN and diabetes.  Past Medical History  Diagnosis Date  . Diabetes mellitus   . Hypertension   . Thyroid disease   . Migraine   . Enlarged heart     Past Surgical History  Procedure Date  . Abdominal hysterectomy   . Knee surgery   . Gastric bypass     Family History  Problem Relation Age of Onset  . Diabetes Mother   . Hypertension Mother   . Diabetes Father   . Hypertension Father     History  Substance Use Topics  . Smoking status: Never Smoker   . Smokeless tobacco: Never Used  . Alcohol Use: No    OB History    Grav Para Term Preterm Abortions TAB SAB Ect Mult Living   4 2 2  2  2   2       Review of Systems  All other systems reviewed and are negative.   10 Systems reviewed and are negative for acute change except as noted in the HPI.  Allergies  Meloxicam; Metformin; and Morphine  Home Medications   Current Outpatient Rx  Name Route Sig Dispense Refill  . VITAMIN D PO Oral Take 1 tablet by mouth daily.      .  ENALAPRIL MALEATE PO Oral Take 1 tablet by mouth daily.      Marland Kitchen FERROUS SULFATE 325 (65 FE) MG PO TABS Oral Take 325 mg by mouth daily.      Marland Kitchen LEVOTHYROXINE SODIUM 125 MCG PO TABS Oral Take 125 mcg by mouth daily.      Marland Kitchen METFORMIN HCL 500 MG PO TABS Oral Take 500 mg by mouth 2 (two) times daily with a meal.      . METOPROLOL SUCCINATE ER 25 MG PO TB24 Oral Take 25 mg by mouth daily.      . TRIAMCINOLONE ACETONIDE 0.1 % EX CREA Topical Apply topically 2 (two) times daily. 45 g 0    BP 107/67  Pulse 96  Temp(Src) 98 F (36.7 C) (Oral)  Resp 20  Ht 5' 7"  (1.702 m)  Wt 350 lb (158.759 kg)  BMI 54.82 kg/m2  SpO2 98%  Physical Exam  Nursing note and vitals reviewed. Constitutional: She is oriented to person, place, and time. She appears well-developed and well-nourished. No distress.       obese  HENT:  Head: Normocephalic and atraumatic.  Eyes: EOM are normal. Pupils are equal, round, and reactive to light.  Neck: Normal range of motion. Neck supple. No tracheal  deviation present.  Cardiovascular: Normal rate, regular rhythm and normal heart sounds.   Pulmonary/Chest: Effort normal and breath sounds normal. No respiratory distress. She exhibits tenderness (moderate right anterior chest wall).  Abdominal: Soft. Bowel sounds are normal. She exhibits no distension.  Musculoskeletal: Normal range of motion.       Chest pain reproduced by ROM of right shoulder against resistance   Neurological: She is alert and oriented to person, place, and time.  Skin: Skin is warm and dry.  Psychiatric: She has a normal mood and affect. Her behavior is normal.    ED Course  Procedures (including critical care time)  DIAGNOSTIC STUDIES: Oxygen Saturation is 98% on room air, normal by my interpretation.    ECG shows normal sinus rhythm with a rate of 92, no ectopy. Normal axis. Normal P wave. Normal QRS. Normal intervals. Normal ST and T waves. Impression: normal ECG.    COORDINATION OF  CARE: 1:08PM EDP ordered medication: Toradol 30 mg   She got good relief of pain with Toradol. D-dimer is come back slightly positive so CT angiogram was obtained which was negative for pulmonary embolism. Patient is given reassurance regarding the negative workup.  1. Musculoskeletal chest pain       MDM  Chest pain which seems to be musculoskeletal in that it is reproducible by chest wall palpation and by arm movement against resistance. Workup will be initiated including a d-dimer, EKG, chest x-ray, laboratory evaluation. She will be given a dose of Toradol and appear.   I personally performed the services described in this documentation, which was scribed in my presence. The recorded information has been reviewed and considered.         Delora Fuel, MD 57/47/34 0370

## 2011-03-14 NOTE — Discharge Instructions (Signed)
Musculoskeletal Pain Musculoskeletal pain is muscle and boney aches and pains. These pains can occur in any part of the body. Your caregiver may treat you without knowing the cause of the pain. They may treat you if blood or urine tests, X-rays, and other tests were normal.  CAUSES There is often not a definite cause or reason for these pains. These pains may be caused by a type of germ (virus). The discomfort may also come from overuse. Overuse includes working out too hard when your body is not fit. Boney aches also come from weather changes. Bone is sensitive to atmospheric pressure changes. HOME CARE INSTRUCTIONS   Ask when your test results will be ready. Make sure you get your test results.   Only take over-the-counter or prescription medicines for pain, discomfort, or fever as directed by your caregiver. If you were given medications for your condition, do not drive, operate machinery or power tools, or sign legal documents for 24 hours. Do not drink alcohol. Do not take sleeping pills or other medications that may interfere with treatment.   Continue all activities unless the activities cause more pain. When the pain lessens, slowly resume normal activities. Gradually increase the intensity and duration of the activities or exercise.   During periods of severe pain, bed rest may be helpful. Lay or sit in any position that is comfortable.   Putting ice on the injured area.   Put ice in a bag.   Place a towel between your skin and the bag.   Leave the ice on for 15 to 20 minutes, 3 to 4 times a day.   Follow up with your caregiver for continued problems and no reason can be found for the pain. If the pain becomes worse or does not go away, it may be necessary to repeat tests or do additional testing. Your caregiver may need to look further for a possible cause.  SEEK IMMEDIATE MEDICAL CARE IF:  You have pain that is getting worse and is not relieved by medications.   You develop  chest pain that is associated with shortness or breath, sweating, feeling sick to your stomach (nauseous), or throw up (vomit).   Your pain becomes localized to the abdomen.   You develop any new symptoms that seem different or that concern you.  MAKE SURE YOU:   Understand these instructions.   Will watch your condition.   Will get help right away if you are not doing well or get worse.  Document Released: 01/15/2005 Document Revised: 09/27/2010 Document Reviewed: 09/05/2007 [facility] Patient Information 2012 [place].

## 2011-05-18 ENCOUNTER — Emergency Department (HOSPITAL_COMMUNITY)
Admission: EM | Admit: 2011-05-18 | Discharge: 2011-05-18 | Disposition: A | Discharge status: Home / Self Care | Payer: Self-pay | Attending: Emergency Medicine | Admitting: Emergency Medicine

## 2011-05-18 ENCOUNTER — Encounter (HOSPITAL_COMMUNITY): Payer: Self-pay | Admitting: *Deleted

## 2011-05-18 DIAGNOSIS — G8929 Other chronic pain: Secondary | ICD-10-CM | POA: Insufficient documentation

## 2011-05-18 DIAGNOSIS — M545 Low back pain, unspecified: Secondary | ICD-10-CM | POA: Insufficient documentation

## 2011-05-18 DIAGNOSIS — I517 Cardiomegaly: Secondary | ICD-10-CM | POA: Insufficient documentation

## 2011-05-18 DIAGNOSIS — E119 Type 2 diabetes mellitus without complications: Secondary | ICD-10-CM | POA: Insufficient documentation

## 2011-05-18 DIAGNOSIS — M549 Dorsalgia, unspecified: Secondary | ICD-10-CM

## 2011-05-18 DIAGNOSIS — E079 Disorder of thyroid, unspecified: Secondary | ICD-10-CM | POA: Insufficient documentation

## 2011-05-18 DIAGNOSIS — I10 Essential (primary) hypertension: Secondary | ICD-10-CM | POA: Insufficient documentation

## 2011-05-18 MED ORDER — HYDROMORPHONE HCL PF 1 MG/ML IJ SOLN
1.0000 mg | Freq: Once | INTRAMUSCULAR | Status: AC
  Administered 2011-05-18: 1 mg via INTRAMUSCULAR
  Filled 2011-05-18: qty 1

## 2011-05-18 MED ORDER — KETOROLAC TROMETHAMINE 60 MG/2ML IM SOLN
60.0000 mg | Freq: Once | INTRAMUSCULAR | Status: AC
  Administered 2011-05-18: 60 mg via INTRAMUSCULAR
  Filled 2011-05-18: qty 2

## 2011-05-18 MED ORDER — HYDROCODONE-ACETAMINOPHEN 7.5-325 MG PO TABS: 1.0000 | ORAL_TABLET | ORAL | Status: AC | PRN

## 2011-05-18 MED ORDER — PROMETHAZINE HCL 12.5 MG PO TABS
25.0000 mg | ORAL_TABLET | Freq: Once | ORAL | Status: AC
  Administered 2011-05-18: 25 mg via ORAL
  Filled 2011-05-18: qty 2

## 2011-05-18 MED ORDER — BACLOFEN 10 MG PO TABS: 10.0000 mg | ORAL_TABLET | Freq: Three times a day (TID) | ORAL | Status: AC

## 2011-05-18 NOTE — ED Notes (Signed)
Pain low back and down both legsx 3 days.  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  Has chronic pain in back since 2008

## 2011-05-18 NOTE — ED Provider Notes (Signed)
History     CSN: [id number]  Arrival date & time 05/18/11  1513   First MD Initiated Contact with Patient 05/18/11 1611      Chief Complaint  Patient presents with  . Back Pain    (Consider location/radiation/quality/duration/timing/severity/associated sxs/prior treatment) Patient is a 41 y.o. female presenting with back pain. The history is provided by the patient.  Back Pain  This is a chronic problem. The current episode started more than 1 week ago. The problem occurs daily. The problem has been gradually worsening. Associated with: No recent injury. Pain worse for the past 3 days. The pain is present in the lumbar spine. The quality of the pain is described as aching and cramping. The pain radiates to the left thigh and right thigh. The pain is severe. The symptoms are aggravated by certain positions. The pain is the same all the time. Pertinent negatives include no chest pain, no fever, no abdominal pain, no bowel incontinence, no perianal numbness, no bladder incontinence and no dysuria. She has tried analgesics for the symptoms. The treatment provided no relief.    Past Medical History  Diagnosis Date  . Diabetes mellitus   . Hypertension   . Thyroid disease   . Migraine   . Enlarged heart     Past Surgical History  Procedure Date  . Abdominal hysterectomy   . Knee surgery   . Gastric bypass     Family History  Problem Relation Age of Onset  . Diabetes Mother   . Hypertension Mother   . Diabetes Father   . Hypertension Father     History  Substance Use Topics  . Smoking status: Never Smoker   . Smokeless tobacco: Never Used  . Alcohol Use: No    OB History    Grav Para Term Preterm Abortions TAB SAB Ect Mult Living   4 2 2  2  2   2       Review of Systems  Constitutional: Negative for fever and activity change.       All ROS Neg except as noted in HPI  HENT: Negative for nosebleeds and neck pain.   Eyes: Negative for photophobia and discharge.    Respiratory: Negative for cough, shortness of breath and wheezing.   Cardiovascular: Negative for chest pain and palpitations.  Gastrointestinal: Negative for abdominal pain, blood in stool and bowel incontinence.  Genitourinary: Negative for bladder incontinence, dysuria, frequency and hematuria.  Musculoskeletal: Positive for back pain. Negative for arthralgias.  Skin: Negative.   Neurological: Negative for dizziness, seizures and speech difficulty.  Psychiatric/Behavioral: Negative for hallucinations and confusion.    Allergies  Meloxicam and Morphine  Home Medications   Current Outpatient Rx  Name Route Sig Dispense Refill  . LEVOTHYROXINE SODIUM 125 MCG PO TABS Oral Take 125 mcg by mouth daily.      Marland Kitchen METOPROLOL SUCCINATE ER 25 MG PO TB24 Oral Take 25 mg by mouth every morning.     Marland Kitchen VITAMIN D PO Oral Take 1 tablet by mouth daily.      . ENALAPRIL MALEATE PO Oral Take 1 tablet by mouth daily.      Marland Kitchen FERROUS SULFATE 325 (65 FE) MG PO TABS Oral Take 325 mg by mouth daily.      Marland Kitchen METFORMIN HCL 500 MG PO TABS Oral Take 500 mg by mouth 2 (two) times daily with a meal.      . TRIAMCINOLONE ACETONIDE 0.1 % EX CREA Topical Apply topically 2 (two)  times daily. 45 g 0    BP 126/66  Pulse 78  Temp(Src) 98.1 F (36.7 C) (Oral)  Resp 20  Ht 5' 7"  (1.702 m)  Wt 348 lb (157.852 kg)  BMI 54.50 kg/m2  SpO2 100%  Physical Exam  Nursing note and vitals reviewed. Constitutional: She is oriented to person, place, and time. She appears well-developed and well-nourished.  Non-toxic appearance.  HENT:  Head: Normocephalic.  Right Ear: Tympanic membrane and external ear normal.  Left Ear: Tympanic membrane and external ear normal.  Eyes: EOM and lids are normal. Pupils are equal, round, and reactive to light.  Neck: Normal range of motion. Neck supple. Carotid bruit is not present.  Cardiovascular: Normal rate, regular rhythm, normal heart sounds, intact distal pulses and normal pulses.    Pulmonary/Chest: Breath sounds normal. No respiratory distress.  Abdominal: Soft. Bowel sounds are normal. There is no tenderness. There is no guarding.  Musculoskeletal: Normal range of motion.       Lumbar area pain to palpation. Exam limited due to pain. No palpable deformity.  Lymphadenopathy:       Head (right side): No submandibular adenopathy present.       Head (left side): No submandibular adenopathy present.    She has no cervical adenopathy.  Neurological: She is alert and oriented to person, place, and time. She has normal strength. No cranial nerve deficit or sensory deficit. She exhibits normal muscle tone. Coordination normal.  Skin: Skin is warm and dry.  Psychiatric: Her speech is normal. Her mood appears anxious.    ED Course  Procedures (including critical care time)  Labs Reviewed - No data to display No results found.   1. Chronic back pain       MDM  I have reviewed nursing notes, vital signs, and all appropriate lab and imaging results for this patient. No gross deficits on limited exam. Pain improved after Dilaudid, toradol and promethazine.       Lenox Ahr, [place] 05/23/11 1045

## 2011-05-18 NOTE — Discharge Instructions (Signed)
Chronic Back Pain When back pain lasts longer than 3 months, it is called chronic back pain.This pain can be frustrating, but the cause of the pain is rarely dangerous.People with chronic back pain often go through certain periods that are more intense (flare-ups). CAUSES Chronic back pain can be caused by wear and tear (degeneration) on different structures in your back. These structures may include bones, ligaments, or discs. This degeneration may result in more pressure being placed on the nerves that travel to your legs and feet. This can lead to pain traveling from the low back down the back of the legs. When pain lasts longer than 3 months, it is not unusual for people to experience anxiety or depression. Anxiety and depression can also contribute to low back pain. TREATMENT  Establish a regular exercise plan. This is critical to improving your functional level.   Have a self-management plan for when you flare-up. Flare-ups rarely require a medical visit. Regular exercise will help reduce the intensity and frequency of your flare-ups.   Manage how you feel about your back pain and the rest of your life. Anxiety, depression, and feeling that you cannot alter your back pain have been shown to make back pain more intense and debilitating.   Medicines should never be your only treatment. They should be used along with other treatments to help you return to a more active lifestyle.   Procedures such as injections or surgery may be helpful but are rarely necessary. You may be able to get the same results with physical therapy or chiropractic care.  HOME CARE INSTRUCTIONS  Avoid bending, heavy lifting, prolonged sitting, and activities which make the problem worse.   Continue normal activity as much as possible.   Take brief periods of rest throughout the day to reduce your pain during flare-ups.   Follow your back exercise rehabilitation program. This can help reduce symptoms and prevent  more pain.   Only take over-the-counter or prescription medicines as directed by your caregiver. Muscle relaxants are sometimes prescribed. Narcotic pain medicine is discouraged for long-term pain, since addiction is a possible outcome.   If you smoke, quit.   Eat healthy foods and maintain a recommended body weight.  SEEK IMMEDIATE MEDICAL CARE IF:   You have weakness or numbness in one of your legs or feet.   You have trouble controlling your bladder or bowels.   You develop nausea, vomiting, abdominal pain, shortness of breath, or fainting.  Document Released: 02/23/2004 Document Revised: 01/04/2011 Document Reviewed: 12/30/2010 [facility] Patient Information 2012 [place].
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
## 2011-05-28 ENCOUNTER — Telehealth: Payer: Self-pay | Admitting: Orthopedic Surgery

## 2011-05-28 ENCOUNTER — Other Ambulatory Visit: Payer: Self-pay | Admitting: Internal Medicine

## 2011-05-28 DIAGNOSIS — N644 Mastodynia: Secondary | ICD-10-CM

## 2011-05-28 NOTE — Telephone Encounter (Signed)
Patient called to request a new order for ESI, epidural steroid injection, for [facility].  Her last office visit here was 07/13/10.  She would like to have it after May 1st, as states she will be covered under new insurance as of 05/30/11, Medicare (disability), and that she no longer has Medicaid.  Her ph# is [phone].  Please advise.

## 2011-05-29 NOTE — ED Provider Notes (Signed)
Medical screening examination/treatment/procedure(s) were performed by non-physician practitioner and as supervising physician I was immediately available for consultation/collaboration.   Maudry Diego, MD 05/29/11 [phone]

## 2011-05-29 NOTE — Telephone Encounter (Signed)
1. Stable appearance of mild facet arthropathy at L4-5 with mild  disc degeneration and minimal grade 1 anterior subluxation, but  without significant change from the prior exam and without specific  findings of stenosis or impingement.   order esi at L4-5 series as needed

## 2011-05-31 ENCOUNTER — Ambulatory Visit
Admission: RE | Admit: 2011-05-31 | Discharge: 2011-05-31 | Disposition: A | Discharge status: Home / Self Care | Payer: Medicare Other | Source: Ambulatory Visit | Attending: Internal Medicine | Admitting: Internal Medicine

## 2011-05-31 ENCOUNTER — Other Ambulatory Visit: Payer: Self-pay | Admitting: *Deleted

## 2011-05-31 ENCOUNTER — Other Ambulatory Visit: Payer: Self-pay | Admitting: Internal Medicine

## 2011-05-31 DIAGNOSIS — N644 Mastodynia: Secondary | ICD-10-CM

## 2011-05-31 DIAGNOSIS — M545 Low back pain, unspecified: Secondary | ICD-10-CM

## 2011-05-31 NOTE — Telephone Encounter (Signed)
PLEASE SCHEDULE ESI

## 2011-06-01 ENCOUNTER — Ambulatory Visit
Admission: RE | Admit: 2011-06-01 | Discharge: 2011-06-01 | Disposition: A | Discharge status: Home / Self Care | Payer: Medicare Other | Source: Ambulatory Visit | Attending: *Deleted | Admitting: *Deleted

## 2011-06-01 VITALS — BP 117/73 | HR 73

## 2011-06-01 DIAGNOSIS — M545 Low back pain, unspecified: Secondary | ICD-10-CM

## 2011-06-01 MED ORDER — IOHEXOL 180 MG/ML  SOLN
1.0000 mL | Freq: Once | INTRAMUSCULAR | Status: AC | PRN
  Administered 2011-06-01: 1 mL via EPIDURAL

## 2011-06-01 MED ORDER — METHYLPREDNISOLONE ACETATE 40 MG/ML INJ SUSP (RADIOLOG
120.0000 mg | Freq: Once | INTRAMUSCULAR | Status: AC
  Administered 2011-06-01: 120 mg via EPIDURAL

## 2011-06-05 NOTE — Telephone Encounter (Signed)
Referral sent today to Dr Ace Gins

## 2011-08-13 ENCOUNTER — Other Ambulatory Visit: Payer: Self-pay | Admitting: *Deleted

## 2011-08-13 DIAGNOSIS — M545 Low back pain, unspecified: Secondary | ICD-10-CM

## 2011-08-13 DIAGNOSIS — M5410 Radiculopathy, site unspecified: Secondary | ICD-10-CM

## 2011-08-21 ENCOUNTER — Ambulatory Visit
Admission: RE | Admit: 2011-08-21 | Discharge: 2011-08-21 | Disposition: A | Discharge status: Home / Self Care | Payer: Medicare Other | Source: Ambulatory Visit | Attending: *Deleted | Admitting: *Deleted

## 2011-08-21 VITALS — BP 99/61 | HR 79

## 2011-08-21 DIAGNOSIS — M545 Low back pain, unspecified: Secondary | ICD-10-CM

## 2011-08-21 DIAGNOSIS — M5410 Radiculopathy, site unspecified: Secondary | ICD-10-CM

## 2011-08-21 MED ORDER — IOHEXOL 180 MG/ML  SOLN
1.0000 mL | Freq: Once | INTRAMUSCULAR | Status: AC | PRN
  Administered 2011-08-21: 1 mL via EPIDURAL

## 2011-08-21 MED ORDER — METHYLPREDNISOLONE ACETATE 40 MG/ML INJ SUSP (RADIOLOG
120.0000 mg | Freq: Once | INTRAMUSCULAR | Status: AC
  Administered 2011-08-21: 120 mg via EPIDURAL

## 2011-11-10 ENCOUNTER — Encounter (HOSPITAL_COMMUNITY): Payer: Self-pay | Admitting: *Deleted

## 2011-11-10 ENCOUNTER — Emergency Department (HOSPITAL_COMMUNITY): Payer: Medicare Other

## 2011-11-10 ENCOUNTER — Emergency Department (HOSPITAL_COMMUNITY)
Admission: EM | Admit: 2011-11-10 | Discharge: 2011-11-10 | Disposition: A | Discharge status: Home / Self Care | Payer: Medicare Other | Attending: Emergency Medicine | Admitting: Emergency Medicine

## 2011-11-10 DIAGNOSIS — K429 Umbilical hernia without obstruction or gangrene: Secondary | ICD-10-CM | POA: Insufficient documentation

## 2011-11-10 DIAGNOSIS — I10 Essential (primary) hypertension: Secondary | ICD-10-CM | POA: Insufficient documentation

## 2011-11-10 DIAGNOSIS — G43909 Migraine, unspecified, not intractable, without status migrainosus: Secondary | ICD-10-CM | POA: Insufficient documentation

## 2011-11-10 DIAGNOSIS — I517 Cardiomegaly: Secondary | ICD-10-CM | POA: Insufficient documentation

## 2011-11-10 DIAGNOSIS — K3184 Gastroparesis: Secondary | ICD-10-CM | POA: Insufficient documentation

## 2011-11-10 DIAGNOSIS — R112 Nausea with vomiting, unspecified: Secondary | ICD-10-CM

## 2011-11-10 DIAGNOSIS — E079 Disorder of thyroid, unspecified: Secondary | ICD-10-CM | POA: Insufficient documentation

## 2011-11-10 DIAGNOSIS — Z79899 Other long term (current) drug therapy: Secondary | ICD-10-CM | POA: Insufficient documentation

## 2011-11-10 DIAGNOSIS — R197 Diarrhea, unspecified: Secondary | ICD-10-CM | POA: Insufficient documentation

## 2011-11-10 DIAGNOSIS — E119 Type 2 diabetes mellitus without complications: Secondary | ICD-10-CM | POA: Insufficient documentation

## 2011-11-10 DIAGNOSIS — Z9884 Bariatric surgery status: Secondary | ICD-10-CM | POA: Insufficient documentation

## 2011-11-10 LAB — COMPREHENSIVE METABOLIC PANEL WITH GFR
AST: 16 U/L (ref 0–37)
Albumin: 3.6 g/dL (ref 3.5–5.2)
Calcium: 9.8 mg/dL (ref 8.4–10.5)
Chloride: 99 meq/L (ref 96–112)
Creatinine, Ser: 0.98 mg/dL (ref 0.50–1.10)
Sodium: 132 meq/L — ABNORMAL LOW (ref 135–145)

## 2011-11-10 LAB — CBC WITH DIFFERENTIAL/PLATELET
Basophils Absolute: 0 10*3/uL (ref 0.0–0.1)
Basophils Relative: 0 % (ref 0–1)
Eosinophils Absolute: 0.1 10*3/uL (ref 0.0–0.7)
Eosinophils Relative: 1 % (ref 0–5)
HCT: 38.5 % (ref 36.0–46.0)
Hemoglobin: 12.4 g/dL (ref 12.0–15.0)
Lymphocytes Relative: 31 % (ref 12–46)
Lymphs Abs: 2.3 10*3/uL (ref 0.7–4.0)
MCH: 28.2 pg (ref 26.0–34.0)
MCHC: 32.2 g/dL (ref 30.0–36.0)
MCV: 87.5 fL (ref 78.0–100.0)
Monocytes Absolute: 0.3 10*3/uL (ref 0.1–1.0)
Monocytes Relative: 4 % (ref 3–12)
Neutro Abs: 4.9 10*3/uL (ref 1.7–7.7)
Neutrophils Relative %: 64 % (ref 43–77)
Platelets: 323 10*3/uL (ref 150–400)
RBC: 4.4 MIL/uL (ref 3.87–5.11)
RDW: 13.3 % (ref 11.5–15.5)
WBC: 7.6 10*3/uL (ref 4.0–10.5)

## 2011-11-10 LAB — COMPREHENSIVE METABOLIC PANEL
ALT: 15 U/L (ref 0–35)
Alkaline Phosphatase: 81 U/L (ref 39–117)
BUN: 12 mg/dL (ref 6–23)
CO2: 25 mEq/L (ref 19–32)
GFR calc Af Amer: 82 mL/min — ABNORMAL LOW (ref >90)
GFR calc non Af Amer: 71 mL/min — ABNORMAL LOW (ref >90)
Glucose, Bld: 175 mg/dL — ABNORMAL HIGH (ref 70–99)
Potassium: 4.8 mEq/L (ref 3.5–5.1)
Total Bilirubin: 0.2 mg/dL — ABNORMAL LOW (ref 0.3–1.2)
Total Protein: 7.8 g/dL (ref 6.0–8.3)

## 2011-11-10 LAB — LIPASE, BLOOD: Lipase: 30 U/L (ref 11–59)

## 2011-11-10 MED ORDER — PROMETHAZINE HCL 25 MG PO TABS: 25.0000 mg | ORAL_TABLET | Freq: Four times a day (QID) | ORAL | Status: DC | PRN

## 2011-11-10 MED ORDER — ONDANSETRON 8 MG PO TBDP: 8.0000 mg | ORAL_TABLET | Freq: Three times a day (TID) | ORAL | Status: DC | PRN

## 2011-11-10 MED ORDER — ONDANSETRON HCL 4 MG/2ML IJ SOLN
4.0000 mg | Freq: Once | INTRAMUSCULAR | Status: AC
  Administered 2011-11-10: 4 mg via INTRAVENOUS
  Filled 2011-11-10: qty 2

## 2011-11-10 MED ORDER — HYDROMORPHONE HCL PF 1 MG/ML IJ SOLN
1.0000 mg | Freq: Once | INTRAMUSCULAR | Status: AC
  Administered 2011-11-10: 1 mg via INTRAVENOUS
  Filled 2011-11-10: qty 1

## 2011-11-10 MED ORDER — HYDROMORPHONE HCL 2 MG PO TABS: 2.0000 mg | ORAL_TABLET | ORAL | Status: DC | PRN

## 2011-11-10 MED ORDER — METOCLOPRAMIDE HCL 10 MG PO TABS: 10.0000 mg | ORAL_TABLET | Freq: Four times a day (QID) | ORAL | Status: DC

## 2011-11-10 MED ORDER — SODIUM CHLORIDE 0.9 % IV SOLN: INTRAVENOUS | Status: DC

## 2011-11-10 MED ORDER — SODIUM CHLORIDE 0.9 % IV BOLUS (SEPSIS)
1000.0000 mL | Freq: Once | INTRAVENOUS | Status: AC
  Administered 2011-11-10: 1000 mL via INTRAVENOUS

## 2011-11-10 MED ORDER — METOCLOPRAMIDE HCL 5 MG/ML IJ SOLN
10.0000 mg | Freq: Once | INTRAMUSCULAR | Status: AC
  Administered 2011-11-10: 10 mg via INTRAVENOUS
  Filled 2011-11-10: qty 2

## 2011-11-10 MED ORDER — IOHEXOL 300 MG/ML  SOLN
100.0000 mL | Freq: Once | INTRAMUSCULAR | Status: AC | PRN
  Administered 2011-11-10: 100 mL via INTRAVENOUS

## 2011-11-10 NOTE — ED Notes (Signed)
Pt with N/V/D for 2 weeks, has been seen for same x 3 times

## 2011-11-10 NOTE — ED Provider Notes (Addendum)
History     CSN: [id number]  Arrival date & time 11/10/11  3893   First MD Initiated Contact with Patient 11/10/11 1907      Chief Complaint  Patient presents with  . Emesis  . Diarrhea    (Consider location/radiation/quality/duration/timing/severity/associated sxs/prior treatment) Patient is a 41 y.o. female presenting with vomiting and diarrhea. The history is provided by the patient.  Emesis  Associated symptoms include abdominal pain and diarrhea. Pertinent negatives include no fever and no headaches.  Diarrhea The primary symptoms include abdominal pain, nausea, vomiting and diarrhea. Primary symptoms do not include fever, dysuria or rash.  The illness does not include back pain.  presents with a two-week history of nausea vomiting and some loose bowel movements. Mostly nausea and vomiting patient has seen her primary care provider for this 3 times. Etiology is not clear she has not had a CAT scan. She does have a long-standing history of diabetes never had anything like this happen before. It is associated with some generalized abdominal pain of 5/10 no history of vomiting blood no blood in the diarrhea. Donald pain described as an ache. Patient states she feels dehydrated.  Patient does have a history of gastric bypass.  Past Medical History  Diagnosis Date  . Diabetes mellitus   . Hypertension   . Thyroid disease   . Migraine   . Enlarged heart     Past Surgical History  Procedure Date  . Abdominal hysterectomy   . Knee surgery   . Gastric bypass     Family History  Problem Relation Age of Onset  . Diabetes Mother   . Hypertension Mother   . Diabetes Father   . Hypertension Father     History  Substance Use Topics  . Smoking status: Never Smoker   . Smokeless tobacco: Never Used  . Alcohol Use: No    OB History    Grav Para Term Preterm Abortions TAB SAB Ect Mult Living   4 2 2  2  2   2       Review of Systems  Constitutional: Negative for  fever.  HENT: Negative for congestion and neck pain.   Eyes: Negative for redness.  Respiratory: Negative for shortness of breath.   Cardiovascular: Negative for chest pain.  Gastrointestinal: Positive for nausea, vomiting, abdominal pain and diarrhea.  Genitourinary: Negative for dysuria.  Musculoskeletal: Negative for back pain.  Skin: Negative for rash.  Neurological: Negative for headaches.  Hematological: Does not bruise/bleed easily.    Allergies  Meloxicam and Morphine  Home Medications   Current Outpatient Rx  Name Route Sig Dispense Refill  . BUPROPION HCL ER (XL) 300 MG PO TB24 Oral Take 300 mg by mouth daily.    Marland Kitchen VITAMIN D PO Oral Take 1 tablet by mouth daily.      . ENALAPRIL MALEATE 10 MG PO TABS Oral Take 10 mg by mouth daily.    Marland Kitchen FERROUS SULFATE 325 (65 FE) MG PO TABS Oral Take 325 mg by mouth daily.      Marland Kitchen LEVOTHYROXINE SODIUM 125 MCG PO TABS Oral Take 125 mcg by mouth daily.    Marland Kitchen METFORMIN HCL 500 MG PO TABS Oral Take 500 mg by mouth 2 (two) times daily with a meal.      . METOPROLOL SUCCINATE ER 25 MG PO TB24 Oral Take 25 mg by mouth daily.    . TRIAMCINOLONE ACETONIDE 0.1 % EX CREA Topical Apply topically 2 (two) times daily. [place]  g 0  . HYDROMORPHONE HCL 2 MG PO TABS Oral Take 1 tablet (2 mg total) by mouth every 4 (four) hours as needed for pain. 20 tablet 0  . METOCLOPRAMIDE HCL 10 MG PO TABS Oral Take 1 tablet (10 mg total) by mouth every 6 (six) hours. 30 tablet 0  . ONDANSETRON 8 MG PO TBDP Oral Take 1 tablet (8 mg total) by mouth every 8 (eight) hours as needed for nausea. 12 tablet 0  . PROMETHAZINE HCL 25 MG PO TABS Oral Take 1 tablet (25 mg total) by mouth every 6 (six) hours as needed for nausea. 15 tablet 0  . PROMETHAZINE HCL 25 MG PO TABS Oral Take 1 tablet (25 mg total) by mouth every 6 (six) hours as needed for nausea. 20 tablet 0    BP 115/64  Pulse 84  Temp 97.8 F (36.6 C) (Oral)  Resp 18  Ht 5' 7"  (1.702 m)  Wt 330 lb (149.687 kg)  BMI  51.69 kg/m2  SpO2 97%  Physical Exam  Nursing note and vitals reviewed. Constitutional: She is oriented to person, place, and time. She appears well-developed and well-nourished.  HENT:  Head: Normocephalic and atraumatic.       Mucous membranes slightly dry.  Eyes: Conjunctivae normal and EOM are normal. Pupils are equal, round, and reactive to light.  Neck: Normal range of motion. Neck supple.  Cardiovascular: Normal rate, regular rhythm and normal heart sounds.   No murmur heard. Pulmonary/Chest: Effort normal and breath sounds normal.  Abdominal: Soft. Bowel sounds are normal. There is no tenderness.  Musculoskeletal: Normal range of motion.  Neurological: She is alert and oriented to person, place, and time. No cranial nerve deficit. Coordination normal.  Skin: Skin is warm and dry. No rash noted.    ED Course  Procedures (including critical care time)   Labs Reviewed  CBC WITH DIFFERENTIAL  COMPREHENSIVE METABOLIC PANEL  LIPASE, BLOOD  URINALYSIS, ROUTINE W REFLEX MICROSCOPIC   Ct Abdomen Pelvis W Contrast  11/10/2011  *RADIOLOGY REPORT*  Clinical Data: Left upper quadrant abdominal pain, nausea/vomiting/diarrhea, history of gastric bypass  CT ABDOMEN AND PELVIS WITH CONTRAST  Technique:  Multidetector CT imaging of the abdomen and pelvis was performed following the standard protocol during bolus administration of intravenous contrast.  Contrast: 178m OMNIPAQUE IOHEXOL 300 MG/ML  SOLN  Comparison: 03/10/2007  Findings: Visualized lungs are clear.  Tiny hiatal hernia.  Status post gastric bypass.  Gas and layering contrast within the excluded stomach (series 2/image 39), indicating some degree of communication.  Liver, spleen, pancreas, and adrenal glands are within normal limits.  Gallbladder is mildly distended.  No associated inflammatory changes.  No intrahepatic or extrahepatic ductal dilatation.  Kidneys are within normal limits.  No hydronephrosis.  No evidence of bowel  obstruction.  Normal appendix.  No evidence of abdominal aortic aneurysm.  No abdominopelvic ascites.  No suspicious abdominopelvic lymphadenopathy.  Status post hysterectomy.  No adnexal masses.  Bladder is within normal limits.  Moderate fat-containing periumbilical hernia with a small amount of associated fluid (series 2/image 77), unchanged from 2009.  Mild degenerative changes of the visualized thoracolumbar spine.  IMPRESSION: No evidence of bowel obstruction.  Normal appendix.  Status post gastric bypass.  Small amount of layering contrast within the excluded portion of the stomach, indicating some degree of communication.  Moderate fat-containing periumbilical hernia with a small amount of fluid, unchanged.   Original Report Authenticated By: SJulian Hy M.D.  1. Nausea & vomiting   2. Gastroparesis       MDM  Labs are not crossing over on the computer. They are still pending CT was ordered the due to her history of gastric bypass to make sure there is no evidence of obstruction. Symptoms sound as if they could be related to her gastroparesis patient in ED treated with normal saline Zofran Reglan and hydromorphone with improvement. CT scan is still pending.  CT scan without significant findings. Does show a periumbilical hernia just with fat no evidence of incarceration. Suspect patient's symptoms are more related to a gastroparesis type syndrome patient markedly improved medications given in the ED. Will repeat some of his meds prior to discharge and send patient home with medications to include Reglan hydromorphone orally Zofran and Phenergan.       Mervin Kung, MD 11/10/11 2107  Mervin Kung, MD 11/10/11 2221

## 2011-11-10 NOTE — Discharge Instructions (Signed)
 Clear Liquid Diet You may be put on a clear liquid diet:  Because you need surgery.  As the first step in eating food by mouth (oral feeding).  To replace fluid when you have watery poop (diarrhea).  As a diet before certain tests are done. HOME CARE  You may have any of the following:  Strained vegetable or fruit juice (no pulp).  Clear broth soup.  High protein gelatin.  Flavored gelatin or ices.  Frozen ice pops without milk.  Honey.  Coffee or tea.  Nutritional drinks that do not have dairy in them.  Bubbly (carbonated) drinks only if your doctor approves. Avoid:  Starches like potatoes, rice, corn, or wheat.  Vegetables (strained vegetable or tomato juice is okay).  Fruit (fruit juice is okay).  Meat.  Milk or other dairy drinks.  Any soups besides those that contain only broth.  Bread.  Certain desserts. Ask your caregiver.  Fats and oils like butter, margarine, mayonnaise, or cooking oils.  Pepper or other spices besides salt.  Supplements that have lactose or fiber in them. Supplements are things you eat or drink like vitamins, minerals, herbs, or nutrition drinks. MAKE SURE YOU:  Understand these instructions.  Will watch your condition.  Will get help right away if you are not doing well or get worse. Document Released: 12/29/2007 Document Revised: 04/09/2011 Document Reviewed: 04/14/2010 [facility] Patient Information 2013 [place], [place]. Cyclic Vomiting Syndrome Cyclic vomiting syndrome is a benign condition in which patients experience bouts or cycles of severe nausea and vomiting that last for hours or even days. The bouts of nausea and vomiting alternate with longer periods of no symptoms and generally good health. Cyclic vomiting syndrome occurs mostly in children, but can affect adults. CAUSES  [facility] has no known cause. Each episode is typically similar to the previous ones. The episodes tend to:   Start at about the same time of  day.  Last the same length of time.  Present the same symptoms at the same level of intensity. Cyclic vomiting syndrome can begin at any age in children and adults. Cyclic vomiting syndrome usually starts between the ages of 3 and 7 years. In adults, episodes tend to occur less often than they do in children, but they last longer. Furthermore, the events or situations that trigger episodes in adults cannot always be pinpointed as easily as they can in children. There are 4 phases of cyclic vomiting syndrome: 1. Prodrome. The prodrome phase signals that an episode of nausea and vomiting is about to begin. This phase can last from just a few minutes to several hours. This phase is often marked by belly (abdominal) pain. Sometimes taking medicine early in the prodrome phase can stop an episode in progress. However, sometimes there is no warning. A person may simply wake up in the middle of the night or early morning and begin vomiting. 2. Episode. The episode phase consists of:  Severe vomiting.  Nausea.  Gagging (retching). 3. Recovery. The recovery phase begins when the nausea and vomiting stop. Healthy color, appetite, and energy return. 4. Symptom-free interval. The symptom-free interval phase is the period between episodes when no symptoms are present. TRIGGERS Episodes can be triggered by an infection or event. Examples of triggers include:  Infections.  Colds, allergies, sinus problems, and the flu.  Eating certain foods such as chocolate or cheese.  Foods with monosodium glutamate (MSG) or preservatives.  Fast foods.  Pre-packaged foods.  Foods with low nutritional value (junk foods).  Overeating.  Eating just before going to bed.  Hot weather.  Dehydration.  Not enough sleep or poor sleep quality.  Physical exhaustion.  Menstruation.  Motion sickness.  Emotional stress (school or home difficulties).  Excitement or stress. SYMPTOMS  The main symptoms of  cyclic vomiting syndrome are:  Severe vomiting.  Nausea.  Gagging (retching). Episodes usually begin at night or the first thing in the morning. Episodes may include vomiting or retching up to 5 or 6 times an hour during the worst of the episode. Episodes usually last anywhere from 1 to 4 days. Episodes can last for up to 10 days. Other symptoms include:  Paleness.  Exhaustion.  Listlessness.  Abdominal pain.  Loose stools or diarrhea. Sometimes the nausea and vomiting are so severe that a person appears to be almost unconscious. Sensitivity to light, headache, fever, dizziness, may also accompany an episode. In addition, the vomiting may cause drooling and excessive thirst. Drinking water usually leads to more vomiting, though the water can dilute the acid in the vomit, making the episode a little less painful. Continuous vomiting can lead to dehydration, which means that the body has lost excessive water and salts. DIAGNOSIS  Cyclic vomiting syndrome is hard to diagnose because there are no clear tests to identify it. A caregiver must diagnose cyclic vomiting syndrome by looking at symptoms and medical history. A caregiver must exclude more common diseases or disorders that can also cause nausea and vomiting. Also, diagnosis takes time because caregivers need to identify a pattern or cycle to the vomiting. TREATMENT  Cyclic vomiting syndrome cannot be cured. Treatment varies, but people with cyclic vomiting syndrome should get plenty of rest and sleep and take medications that prevent, stop, or lessen the vomiting episodes and other symptoms. People whose episodes are frequent and long-lasting may be treated during the symptom-free intervals in an effort to prevent or ease future episodes. The symptom-free phase is a good time to eliminate anything known to trigger an episode. For example, if episodes are brought on by stress or excitement, this period is the time to find ways to reduce  stress and stay calm. If sinus problems or allergies cause episodes, those conditions should be treated. The triggers listed above should be avoided or prevented. Because of the similarities between migraine and cyclic vomiting syndrome, caregivers treat some people with severe cyclic vomiting syndrome with drugs that are also used for migraine headaches. The drugs are designed to:  Prevent episodes.  Reduce their frequency.  Lessen their severity. HOME CARE INSTRUCTIONS Once a vomiting episode begins, treatment is supportive. It helps to stay in bed and sleep in a dark, quiet room. Severe nausea and vomiting may require hospitalization and intravenous (IV) fluids to prevent dehydration. Relaxing medications (sedatives) may help if the nausea continues. Sometimes, during the prodrome phase, it is possible to stop an episode from happening altogether. Only take over-the-counter or prescription medicines for pain, discomfort or fever as directed by your caregiver. Do not give aspirin to children. During the recovery phase, drinking water and replacing lost electrolytes (salts in the blood) are very important. Electrolytes are salts that the body needs to function well and stay healthy. Symptoms during the recovery phase can vary. Some people find that their appetites return to normal immediately, while others need to begin by drinking clear liquids and then move slowly to solid food. RELATED COMPLICATIONS The severe vomiting that defines cyclic vomiting syndrome is a risk factor for several complications:  Dehydration Vomiting  causes the body to lose water quickly.  Electrolyte imbalance Vomiting also causes the body to lose the important salts it needs to keep working properly.  Peptic esophagitis The tube that connects the mouth to the stomach (esophagus) becomes injured from the stomach acid that comes up with the vomit.  Hematemesis The esophagus becomes irritated and bleeds, so blood mixes  with the vomit.  Mallory-Weiss tear The lower end of the esophagus may tear open or the stomach may bruise from vomiting or retching.  Tooth decay The acid in the vomit can hurt the teeth by corroding the tooth enamel. SEEK MEDICAL CARE IF: You have questions or problems. Document Released: 03/26/2001 Document Revised: 04/09/2011 Document Reviewed: 04/24/2010 [facility] Patient Information 2013 [place], [place].  Take medications as directed. Her CAT scan shows no significant abnormalities. Make an appointment to followup with her primary care doctor in the next few days. Return for any new or worse symptoms. Work note provided.

## 2011-11-14 ENCOUNTER — Ambulatory Visit (INDEPENDENT_AMBULATORY_CARE_PROVIDER_SITE_OTHER): Payer: Medicare Other

## 2011-11-14 ENCOUNTER — Encounter: Payer: Self-pay | Admitting: Orthopedic Surgery

## 2011-11-14 ENCOUNTER — Ambulatory Visit (INDEPENDENT_AMBULATORY_CARE_PROVIDER_SITE_OTHER): Payer: Medicare Other | Admitting: Orthopedic Surgery

## 2011-11-14 VITALS — BP 100/60 | Ht 67.0 in | Wt 330.0 lb

## 2011-11-14 DIAGNOSIS — M25579 Pain in unspecified ankle and joints of unspecified foot: Secondary | ICD-10-CM

## 2011-11-14 DIAGNOSIS — M6789 Other specified disorders of synovium and tendon, multiple sites: Secondary | ICD-10-CM

## 2011-11-14 DIAGNOSIS — M25571 Pain in right ankle and joints of right foot: Secondary | ICD-10-CM

## 2011-11-14 DIAGNOSIS — M76829 Posterior tibial tendinitis, unspecified leg: Secondary | ICD-10-CM | POA: Insufficient documentation

## 2011-11-14 NOTE — Progress Notes (Signed)
Patient ID: Summer Lopez, female   DOB: [birth year], 41 y.o.   MRN: [id number] Chief Complaint  Patient presents with  . Foot Pain    right foot pain under ankle x 9 months, gradual onset, no known injury    Pain x 9 months lateral foot , dull 7/10, constant.   Review of systems gastrointestinal nausea and vomiting, skin itching, neurologic dizziness, psychiatric anxiety depression  Past Medical History  Diagnosis Date  . Diabetes mellitus   . Hypertension   . Thyroid disease   . Migraine   . Enlarged heart   . Depression     Examination well-developed well-nourished female grooming hygiene and intact vital signs are stable she is oriented x3 her mood and affect is normal she ambulates with cane.  She has a pes planus which is flexible she is a poor single-leg heel raise. She is normal ankle motion normal subtalar motion with painful eversion stable ankle weak posterior tibial tendon normal skin good pulse normal temperature normal sensation normal reflexes no lymphadenopathy  X-ray shows an eater signed but otherwise normal ankle and foot  Impression posterior tibial tendon dysfunction/insufficiency  Recommend physical therapy and a short Cam Walker for 8 weeks

## 2011-11-14 NOTE — Patient Instructions (Addendum)
START PHYSICAL THERAPY  Call hospital  to make appointment   Posterior Tibial Tendon Tendinitis with Rehab Tendonitis is a condition that is characterized by inflammation of a tendon or the lining (sheath) that surrounds it. The inflammation is usually caused by damage to the tendon, such as a tendon tear (strain). Sprains are classified into three categories. Grade 1 sprains cause pain, but the tendon is not lengthened. Grade 2 sprains include a lengthened ligament due to the ligament being stretched or partially ruptured. With grade 2 sprains there is still function, although the function may be diminished. Grade 3 sprains are characterized by a complete tear of the tendon or muscle, and function is usually impaired. Posterior tibialis tendonitis is tendonitis of the posterior tibial tendon, which attaches muscles of the lower leg to the foot. The posterior tibial tendon is located in the back of the ankle and helps the body straighten (plantarflex) and rotate inward (medially rotate) the ankle. SYMPTOMS    Pain, tenderness, swelling, warmth, and/or redness over the back of the inner ankle at the posterior tibial tendon or the inner part of the mid-foot.   Pain that worsens with plantarflexion or medial rotation of the ankle.   A crackling sound (crepitation) when the tendon is moved or touched.  CAUSES   Posterior tibial tendonitis occurs when damage to the posterior tibial tendon starts an inflammatory response. Common mechanisms of injury include:  Degenerative (occurs with aging) processes that weaken the tendon and make it more susceptible to injury.   Stress placed on the tendon from an increase in the intensity, frequency, or duration of training.   Direct trauma to the ankle.   Returning to activity before a previous ankle injury is allowed to heal.  RISK INCREASES WITH:  Activities that involve repetitive and/or stressful plantarflexion (jumping, kicking, or running up/down  hills).   Poor strength and flexibility.   Flat feet.   Previous injury to the foot, ankle, or leg.  PREVENTION    Warm up and stretch properly before activity.   Allow for adequate recovery between workouts.   Maintain physical fitness:   Strength, flexibility, and endurance.   Cardiovascular fitness.   Learn and use proper technique. When possible, have a coach correct improper technique.   Complete rehabilitation from a previous foot, ankle, or leg injury.   If you have flat feet, wear arch supports (orthotics).  PROGNOSIS   If treated properly, then the symptoms of tendonitis usually resolve within 6 weeks. This period may be shorter for injuries caused by direct trauma. RELATED COMPLICATIONS    Prolonged healing time, if improperly treated or re-injured.   Recurrent symptoms that result in a chronic problem.   Partial or complete tendon tear (rupture) requiring surgery.  TREATMENT   Treatment initially involves the use of ice and medication to help reduce pain and inflammation. The use of strengthening and stretching exercises may help reduce pain with activity. These exercises may be performed at home or with referral to a therapist. Often times, your caregiver will recommend immobilizing the ankle to allow the tendon to heal. If you have flat feet, the you may be advised to wear orthotic arch supports. If symptoms persist for greater than 6 months despite non-surgical (conservative) treatment, then surgery may be recommended. MEDICATION    If pain medication is necessary, then nonsteroidal anti-inflammatory medications, such as aspirin and ibuprofen, or other minor pain relievers, such as acetaminophen, are often recommended.   Do not take pain medication  for 7 days before surgery.   Prescription pain relievers may be given if deemed necessary by your caregiver. Use only as directed and only as much as you need.   Corticosteroid injections may be given by your  caregiver. These injections should be reserved for the most serious cases, because they may only be given a certain number of times.  HEAT AND COLD  Cold treatment (icing) relieves pain and reduces inflammation. Cold treatment should be applied for 10 to 15 minutes every 2 to 3 hours for inflammation and pain and immediately after any activity that aggravates your symptoms. Use ice packs or massage the area with a piece of ice (ice massage).   Heat treatment may be used prior to performing the stretching and strengthening activities prescribed by your caregiver, physical therapist, or athletic trainer. Use a heat pack or soak the injury in warm water.  SEEK MEDICAL CARE IF:  Treatment seems to offer no benefit, or the condition worsens.   Any medications produce adverse side effects.  EXERCISES RANGE OF MOTION (ROM) AND STRETCHING EXERCISES - Posterior Tibial Tendon Tendinitis These exercises may help you when beginning to rehabilitate your injury. Your symptoms may resolve with or without further involvement from your physician, physical therapist or athletic trainer. While completing these exercises, remember:    Restoring tissue flexibility helps normal motion to return to the joints. This allows healthier, less painful movement and activity.   An effective stretch should be held for at least 30 seconds.   A stretch should never be painful. You should only feel a gentle lengthening or release in the stretched tissue.  RANGE OF MOTION - Ankle Plantar Flexion   Sit with your right / left leg crossed over your opposite knee.   Use your opposite hand to pull the top of your foot and toes toward you.   You should feel a gentle stretch on the top of your foot/ankle. Hold this position for __________ seconds.  Repeat __________ times. Complete this exercise __________ times per day.   RANGE OF MOTION - Ankle Eversion   Sit with your right / left ankle crossed over your opposite knee.    Grip your foot with your opposite hand, placing your thumb on the top of your foot and your fingers across the bottom of your foot.   Gently push your foot downward with a slight rotation so your littlest toes rise slightly   You should feel a gentle stretch on the inside of your ankle. Hold the stretch for __________ seconds.  Repeat __________ times. Complete this exercise __________ times per day.   RANGE OF MOTION - Ankle Inversion   Sit with your right / left ankle crossed over your opposite knee.   Grip your foot with your opposite hand, placing your thumb on the bottom of your foot and your fingers across the top of your foot.   Gently pull your foot so the smallest toe comes toward you and your thumb pushes the inside of the ball of your foot away from you.   You should feel a gentle stretch on the outside of your ankle. Hold the stretch for __________ seconds.  Repeat __________ times. Complete this exercise __________ times per day.   RANGE OF MOTION - Dorsi/Plantar Flexion  While sitting with your right / left knee straight, draw the top of your foot upwards by flexing your ankle. Then reverse the motion, pointing your toes downward.   Hold each position for __________ seconds.  After completing your first set of exercises, repeat this exercise with your knee bent.  Repeat __________ times. Complete this exercise __________ times per day.   RANGE OF MOTION - Ankle Alphabet  Imagine your right / left big toe is a pen.   Keeping your hip and knee still, write out the entire alphabet with your "pen." Make the letters as large as you can without increasing any discomfort.  Repeat __________ times. Complete this exercise __________ times per day.   STRETCH - Gastrocsoleus   Sit with your right / left leg extended. Holding onto both ends of a belt or towel, loop it around the ball of your foot.   Keeping your right / left ankle and foot relaxed and your knee straight, pull  your foot and ankle toward you using the belt/towel.   You should feel a gentle stretch behind your calf or knee. Hold this position for __________ seconds.  Repeat __________ times. Complete this exercise __________ times per day.   STRETCH  Gastroc, Standing   Place hands on wall.   Extend right / left leg, keeping the front knee somewhat bent.   Slightly point your toes inward on your back foot.   Keeping your right / left heel on the floor and your knee straight, shift your weight toward the wall, not allowing your back to arch.   You should feel a gentle stretch in the right / left calf. Hold this position for __________ seconds.  Repeat __________ times. Complete this stretch __________ times per day. STRETCH  Soleus, Standing   Place hands on wall.   Extend right / left leg, keeping the other knee somewhat bent.   Slightly point your toes inward on your back foot.   Keep your right / left heel on the floor, bend your back knee, and slightly shift your weight over the back leg so that you feel a gentle stretch deep in your back calf.   Hold this position for __________ seconds.  Repeat __________ times. Complete this stretch __________ times per day. STRENGTHENING EXERCISES - Posterior Tibial Tendon Tendinitis These exercises may help you when beginning to rehabilitate your injury. They may resolve your symptoms with or without further involvement from your physician, physical therapist or athletic trainer. While completing these exercises, remember:    Muscles can gain both the endurance and the strength needed for everyday activities through controlled exercises.   Complete these exercises as instructed by your physician, physical therapist or athletic trainer. Progress the resistance and repetitions only as guided.  STRENGTH - Dorsiflexors  Secure a rubber exercise band/tubing to a fixed object (ie. table, pole) and loop the other end around your right / left foot.    Sit on the floor facing the fixed object. The band/tubing should be slightly tense when your foot is relaxed.   Slowly draw your foot back toward you using your ankle and toes.   Hold this position for __________ seconds. Slowly release the tension in the band and return your foot to the starting position.  Repeat __________ times. Complete this exercise __________ times per day.   STRENGTH - Towel Curls  Sit in a chair positioned on a non-carpeted surface.   Place your foot on a towel, keeping your heel on the floor.   Pull the towel toward your heel by only curling your toes. Keep your heel on the floor.   If instructed by your physician, physical therapist or athletic trainer, add ____________________ at the  end of the towel.  Repeat __________ times. Complete this exercise __________ times per day. STRENGTH - Ankle Eversion   Secure one end of a rubber exercise band/tubing to a fixed object (table, pole). Loop the other end around your foot just before your toes.   Place your fists between your knees. This will focus your strengthening at your ankle.   Drawing the band/tubing across your opposite foot, slowly, pull your little toe out and up. Make sure the band/tubing is positioned to resist the entire motion.   Hold this position for __________ seconds.   Have your muscles resist the band/tubing as it slowly pulls your foot back to the starting position.  Repeat __________ times. Complete this exercise __________ times per day.   STRENGTH - Ankle Inversion   Secure one end of a rubber exercise band/tubing to a fixed object (table, pole). Loop the other end around your foot just before your toes.   Place your fists between your knees. This will focus your strengthening at your ankle.   Slowly, pull your big toe up and in, making sure the band/tubing is positioned to resist the entire motion.   Hold this position for __________ seconds.   Have your muscles resist the  band/tubing as it slowly pulls your foot back to the starting position.  Repeat __________ times. Complete this exercises __________ times per day.   Document Released: 01/15/2005 Document Revised: 04/09/2011 Document Reviewed: 04/29/2008 [facility] Patient Information 2013 [place].

## 2011-11-16 ENCOUNTER — Other Ambulatory Visit: Payer: Self-pay | Admitting: *Deleted

## 2011-11-16 DIAGNOSIS — M76829 Posterior tibial tendinitis, unspecified leg: Secondary | ICD-10-CM

## 2011-11-20 ENCOUNTER — Other Ambulatory Visit: Payer: Self-pay | Admitting: Anesthesiology

## 2011-11-20 DIAGNOSIS — M549 Dorsalgia, unspecified: Secondary | ICD-10-CM

## 2011-11-21 ENCOUNTER — Ambulatory Visit
Admission: RE | Admit: 2011-11-21 | Discharge: 2011-11-21 | Disposition: A | Discharge status: Home / Self Care | Payer: Medicare Other | Source: Ambulatory Visit | Attending: Anesthesiology | Admitting: Anesthesiology

## 2011-11-21 DIAGNOSIS — M549 Dorsalgia, unspecified: Secondary | ICD-10-CM

## 2011-11-21 MED ORDER — METHYLPREDNISOLONE ACETATE 40 MG/ML INJ SUSP (RADIOLOG
120.0000 mg | Freq: Once | INTRAMUSCULAR | Status: AC
  Administered 2011-11-21: 120 mg via EPIDURAL

## 2011-11-21 MED ORDER — IOHEXOL 180 MG/ML  SOLN
1.0000 mL | Freq: Once | INTRAMUSCULAR | Status: AC | PRN
  Administered 2011-11-21: 1 mL via EPIDURAL

## 2011-11-26 ENCOUNTER — Ambulatory Visit (HOSPITAL_COMMUNITY): Payer: Medicare Other | Admitting: Physical Therapy

## 2011-12-17 ENCOUNTER — Ambulatory Visit (HOSPITAL_COMMUNITY)
Admission: RE | Admit: 2011-12-17 | Discharge: 2011-12-17 | Disposition: A | Discharge status: Home / Self Care | Payer: Medicare Other | Source: Ambulatory Visit | Attending: Orthopedic Surgery | Admitting: Orthopedic Surgery

## 2011-12-17 DIAGNOSIS — IMO0001 Reserved for inherently not codable concepts without codable children: Secondary | ICD-10-CM | POA: Insufficient documentation

## 2011-12-17 DIAGNOSIS — M25579 Pain in unspecified ankle and joints of unspecified foot: Secondary | ICD-10-CM | POA: Insufficient documentation

## 2011-12-17 NOTE — Evaluation (Signed)
Physical Therapy Evaluation  Patient Details  Name: Summer Lopez MRN: [id number] Date of Birth: [birth year]  Today's Date: 12/17/2011 Time: 4854-6270 PT Time Calculation (min): 40 min Charges: 1 eval, 10' TE Visit#: 1  of 12   Re-eval: 01/16/12 Assessment Diagnosis: R ankle PTT Next MD Visit: Dr. Aline Brochure   Authorization: MEDICARE  Authorization Time Period:    Authorization Visit#: 1  of 10    Past Medical History:  Past Medical History  Diagnosis Date  . Diabetes mellitus   . Hypertension   . Thyroid disease   . Migraine   . Enlarged heart   . Depression    Past Surgical History:  Past Surgical History  Procedure Date  . Abdominal hysterectomy   . Knee surgery   . Gastric bypass     Subjective Symptoms/Limitations Symptoms: PMH: low back pain.  Pertinent History: Pt is referred to PT for R ankle PTT which started about 4 months ago and she noticed a constant pain.  She reports that she was trying to self help with massage and ice.  She reports that the ice helps, but the massage did not help.  She is to be in the boot for 2  months (starting 10/18).  reports that with her boot off is an 8/10.  with the boot on she does not have pain, except for to her back. She reports that she always leans on her RLE, even when it was hurting.  She states she believes she has a leg length discrepency.  How long can you sit comfortably?: no difficulty  How long can you stand comfortably?: unable to without pain How long can you walk comfortably?: unable to without pain.  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  1 class on campus, other online due to LBP Comments: Being with her teenage children and adopted children  Cognition/Observation Observation/Other Assessments Observations: B LE significant Pes Planus.  5 cm RLE navicular drop. Genu Valgum  Sensation/Coordination/Flexibility/Functional Tests Coordination Gross Motor Movements are Fluid and Coordinated: No Coordination and Movement Description: impaired to TrA and PF musculature.  Assessment RLE AROM (degrees) RLE Overall AROM Comments: Solues: 12 degrees; Gastroc: 10 Right Ankle Dorsiflexion: 10  Right Ankle Plantar Flexion: 40  Right Ankle Inversion: 40  (increased pain) Right Ankle Eversion: 22  (increased pain) RLE Strength Right Hip Flexion: 3+/5 Right Hip Extension: 3+/5 Right Hip ABduction: 4/5 Right Hip ADduction: 3+/5 Right Knee Flexion: 4/5 Right Knee Extension: 4/5 Right Ankle Dorsiflexion: 4/5 Right Ankle Plantar Flexion: 2+/5 Right Ankle Inversion: 3+/5 Right Ankle Eversion: 3+/5 Palpation Palpation: significant pain and tenderness to R heel and peroneal musculature.    Mobility/Balance  Ambulation/Gait Ambulation/Gait: Yes Ambulation/Gait Assistance:  (CAM walker) Gait Pattern: Antalgic;Lateral hip instability Posture/Postural Control Posture/Postural Control: Postural limitations Postural Limitations: when standing leans to R side.  Significant slouched posture.    Exercise/Treatments Ankle Stretches Soleus Stretch: 1 rep;30 seconds Gastroc Stretch: 1 rep;30 seconds Ankle Exercises - Supine T-Band: Blue T-band given 10x each: DF, PF, IN and EV  Physical Therapy Assessment and Plan PT Assessment and Plan Clinical Impression Statement: Pt is a 41 year old female referred to PT for R  ankle PTT with significant impairements with overall body mechanics and impaire core strength leading to increased pressure to her R LE Pt will benefit from skilled therapeutic intervention in order to improve  on the following deficits: Abnormal gait;Decreased coordination;Decreased activity tolerance;Decreased strength;Pain;Increased fascial restricitons Rehab Potential: Good PT Frequency: Min 3X/week PT Duration: 8 weeks PT Treatment/Interventions: Gait training;Stair training;Functional mobility training;Therapeutic activities;Therapeutic exercise;Balance training;Neuromuscular re-education;Patient/family education;Manual techniques;Modalities PT Plan: Check leg length discrepancy and correct if able.  Add ankle and foot strengthening (towel activities, marble pick up), continue with core strengthening and coordination.  Encourage appropriate mechanics to place even weight on BLE to decrease risk of further injury.     Goals Home Exercise Program Pt will Perform Home Exercise Program: Independently PT Goal: Perform Home Exercise Program - Progress: Goal set today PT Short Term Goals Time to Complete Short Term Goals: 2 weeks PT Short Term Goal 1: Pt will report pain less than 5/10 to foot while out of CAM boot.  PT Short Term Goal 2: Pt will improve her LE strength by 1 muscle grade. PT Short Term Goal 3: Pt will be able to contract core musculature with min cueing.  PT Short Term Goal 4: Pt will demonstrate R SLS x10 sec independently.  PT Long Term Goals Time to Complete Long Term Goals: 8 weeks PT Long Term Goal 1: Pt will improve LE strength in order to ambulate with appropriate gait mechanics.  PT Long Term Goal 2: Pt will improve core strength and coordination in order to present with normalized posture for an entire therapy regime to decrease risk of secondary injury and improve even distrubution to B LE.  Long Term Goal 3: Pt will report pain less than 4/10 for 75% of her day for improved QOL.   Problem List Patient Active Problem List  Diagnosis  . CARPAL TUNNEL SYNDROME  . BUCKET HANDLE TEAR OF LATERAL MENISCUS  . DERANGEMENT OF ANTERIOR HORN OF LATERAL MENISCUS  . JOINT EFFUSION,  RIGHT KNEE  . KNEE PAIN  . H N P-LUMBAR  . LOW BACK PAIN  . PLANTAR FACIITIS  . CLOSED FRACTURE OF ASTRAGALUS  . TEAR LATERAL MENISCUS  . TEAR M C L  . MRSA  . HYPOTHYROIDISM  . DIABETES MELLITUS, TYPE II  . VITAMIN D DEFICIENCY  . OBESITY  . ANEMIA  . DEPRESSION  . CHRONIC PAIN SYNDROME  . GERD  . DYSPEPSIA  . OTHER COMPLICATIONS OF OTHER BARIATRIC PROCEDURE  . CONSTIPATION  . CONSTIPATION, CHRONIC  . RECTAL BLEEDING  . OSTEOARTHRITIS, KNEE, LEFT  . ARTHRITIS  . FIBROMYALGIA  . HEADACHE, CHRONIC  . NAUSEA AND VOMITING  . LUQ PAIN  . MEDIAL MENISCUS TEAR, LEFT  . ARTHROSCOPY, LEFT KNEE, HX OF  . Posterior tibialis tendon insufficiency  . Posterior tibial tendon dysfunction  . Pain in joint, ankle and foot   PT Plan of Care PT Home Exercise Plan: see scanned report.  PT Patient Instructions: Discussed importance of core strength and posture. Discussed NS and Cx policy Consulted and Agree with Plan of Care: Patient  GP Functional Assessment Tool Used: self report Functional Limitation: Mobility: Walking and moving around Mobility: Walking and Moving Around Current Status ([id number]): At least 60 percent but less than 80 percent impaired, limited or restricted Mobility: Walking and Moving Around Goal Status [phone]): At least 20 percent but less than 40 percent impaired, limited or restricted  Cortney Mckinney, PT 12/17/2011, 12:30 PM  Physician Documentation Your signature is required to  indicate approval of the treatment plan as stated above.  Please sign and either send electronically or make a copy of this report for your files and return this physician signed original.   Please mark one 1.__approve of plan  2. ___approve of plan with the following conditions.   ______________________________                                                          _____________________ Physician Signature                                                                                                              Date

## 2011-12-19 ENCOUNTER — Ambulatory Visit (HOSPITAL_COMMUNITY)
Admission: RE | Admit: 2011-12-19 | Discharge: 2011-12-19 | Disposition: A | Discharge status: Home / Self Care | Payer: Medicare Other | Source: Ambulatory Visit | Attending: Orthopedic Surgery | Admitting: Orthopedic Surgery

## 2011-12-19 NOTE — Progress Notes (Signed)
Physical Therapy Treatment Patient Details  Name: Summer Lopez MRN: [id number] Date of Birth: 1970/11/06  Today's Date: 12/19/2011 Time: 1105-1150 PT Time Calculation (min): 45 min  Visit#: 2  of 12   Re-eval: 01/16/12  Charge: Gait 8', therex 35'  Authorization: Medicare  Authorization Time Period:    Authorization Visit#: 2  of 10    Subjective: Symptoms/Limitations Symptoms: R ankle medial/lateral and dorsal surface pain scale 6/10.  Pt stated compliance with HEP with no questions. Pain Assessment Currently in Pain?: Yes Pain Score:   6 Pain Location: Foot Pain Orientation: Right;Medial;Lateral;Upper  Objective;   Exercise/Treatments Ankle Stretches Soleus Stretch: 3 reps;30 seconds Gastroc Stretch: 3 reps;30 seconds Ankle Exercises - Standing Rebounder: 2 min R/L Other Standing Ankle Exercises: Gait training for equal stance time 4 RT  Ankle Exercises - Seated Towel Crunch: 2 reps Towel Inversion/Eversion: 3 reps Marble Pickup: 15 marbles; 10 small/ 5 large Heel Raises: 15 reps Toe Raise: 15 reps Other Seated Ankle Exercises: TrA activation during all seated activities for posture.core strengthening Other Seated Ankle Exercises: Multifidus 15x5 " with tactile cueing for correct musculature activation Ankle Exercises - Supine T-Band: Blue T-band given 15x each: DF, PF, IN and EV  Physical Therapy Assessment and Plan PT Assessment and Plan Clinical Impression Statement: Pt with improved gait mechanics following gait training for equal stance phase, pt continues to lean to R with WB due to weakness and impaired balance.  Added ankle and core strengthening exercises per PT plan with good form and control following cueing for technique noted increase fatigue with activity.  Pt did require tactile cueing for TrA and Multifidus for correct musculature activation. PT Plan: Check leg length discrepancy and correct if able. Add ankle and foot strengthening (towel  activities, marble pick up), continue with core strengthening and coordination. Encourage appropriate mechanics to place even weight on BLE to decrease risk of further injury.    Goals    Problem List Patient Active Problem List  Diagnosis  . CARPAL TUNNEL SYNDROME  . BUCKET HANDLE TEAR OF LATERAL MENISCUS  . DERANGEMENT OF ANTERIOR HORN OF LATERAL MENISCUS  . JOINT EFFUSION, RIGHT KNEE  . KNEE PAIN  . H N P-LUMBAR  . LOW BACK PAIN  . PLANTAR FACIITIS  . CLOSED FRACTURE OF ASTRAGALUS  . TEAR LATERAL MENISCUS  . TEAR M C L  . MRSA  . HYPOTHYROIDISM  . DIABETES MELLITUS, TYPE II  . VITAMIN D DEFICIENCY  . OBESITY  . ANEMIA  . DEPRESSION  . CHRONIC PAIN SYNDROME  . GERD  . DYSPEPSIA  . OTHER COMPLICATIONS OF OTHER BARIATRIC PROCEDURE  . CONSTIPATION  . CONSTIPATION, CHRONIC  . RECTAL BLEEDING  . OSTEOARTHRITIS, KNEE, LEFT  . ARTHRITIS  . FIBROMYALGIA  . HEADACHE, CHRONIC  . NAUSEA AND VOMITING  . LUQ PAIN  . MEDIAL MENISCUS TEAR, LEFT  . ARTHROSCOPY, LEFT KNEE, HX OF  . Posterior tibialis tendon insufficiency  . Posterior tibial tendon dysfunction  . Pain in joint, ankle and foot    PT - End of Session Activity Tolerance: Patient tolerated treatment well General Behavior During Session: [facility] for tasks performed Cognition: [facility] for tasks performed  GP    Aldona Lento 12/19/2011, 12:10 PM

## 2012-01-15 ENCOUNTER — Ambulatory Visit: Payer: Medicare Other | Admitting: Orthopedic Surgery

## 2012-04-04 IMAGING — CT CT ANGIO CHEST
1 of 6 series · 5 of 36 positions shown · IV contrast (Omnipaque 300)
Comparison: Chest radiograph 03/14/2011.

CLINICAL DATA: Chest pain, shortness of breath, gastric bypass.

CT ANGIOGRAPHY CHEST
TECHNIQUE: Multidetector CT imaging of the chest using the
standard protocol during bolus administration of intravenous
contrast. Multiplanar reconstructed images including MIPs were
obtained and reviewed to evaluate the vascular anatomy.
Contrast: 100mL OMNIPAQUE IOHEXOL 350 MG/ML IV SOLN

[Series 4: pe 3.0 b40f · axial · 0.62mm/px · z∈[-249,-105]mm · 5 of 72 slices shown]
[im 12/72  lung]
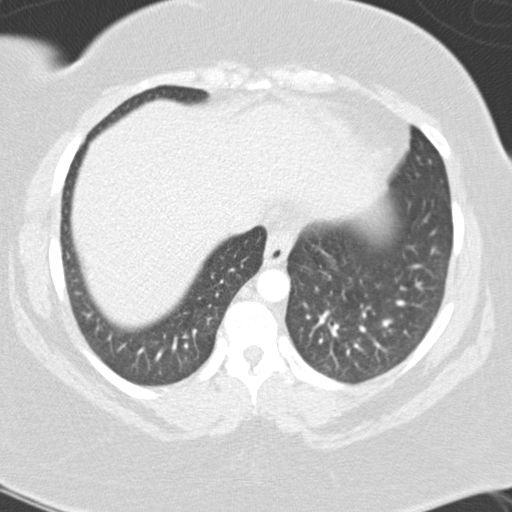
[im 24/72  mediastinal]
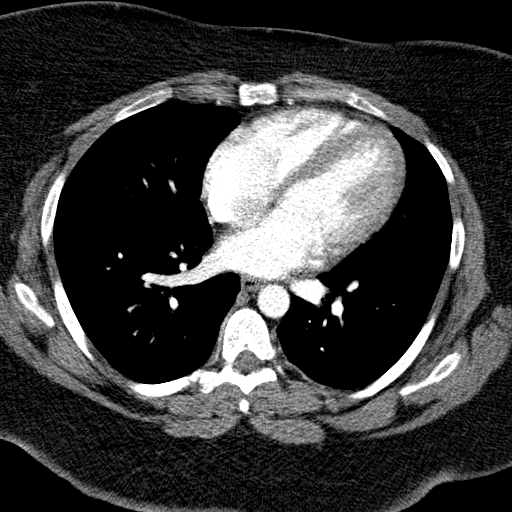
[im 36/72  lung]
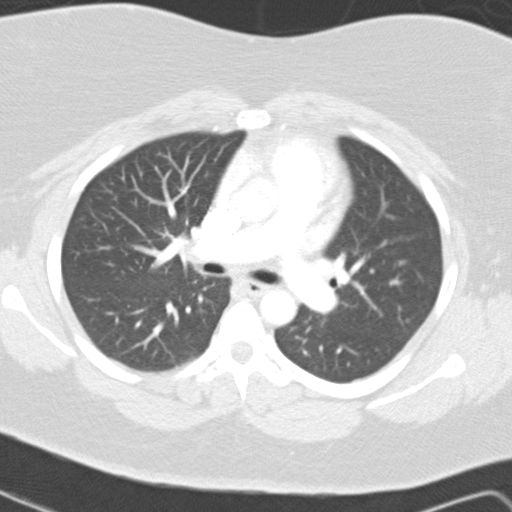
[im 48/72  mediastinal]
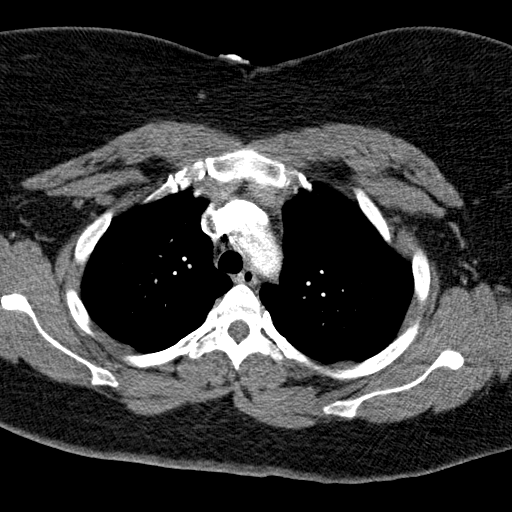
[im 60/72  lung]
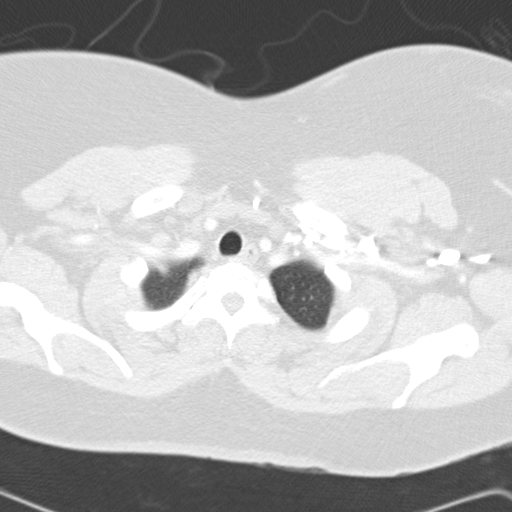

[5 of 36 positions shown; findings below may reference images not displayed]

FINDINGS: No pulmonary embolus.  No pathologically enlarged
mediastinal, hilar or axillary lymph nodes.  Heart size normal.  No
pericardial effusion.

Lungs are clear.  No pleural fluid.  Airway is unremarkable.

Incidental imaging of the upper abdomen shows no acute findings.
Changes of gastric bypass are partially imaged.  No worrisome lytic
or sclerotic lesions.
IMPRESSION: No pulmonary embolus.  No acute findings.

## 2012-04-09 DIAGNOSIS — I10 Essential (primary) hypertension: Secondary | ICD-10-CM | POA: Insufficient documentation

## 2012-04-09 DIAGNOSIS — N951 Menopausal and female climacteric states: Secondary | ICD-10-CM | POA: Insufficient documentation

## 2012-04-10 ENCOUNTER — Other Ambulatory Visit: Payer: Self-pay | Admitting: Family Medicine

## 2012-04-10 DIAGNOSIS — Z1231 Encounter for screening mammogram for malignant neoplasm of breast: Secondary | ICD-10-CM

## 2012-06-02 ENCOUNTER — Ambulatory Visit
Admission: RE | Admit: 2012-06-02 | Discharge: 2012-06-02 | Disposition: A | Discharge status: Home / Self Care | Payer: Medicare Other | Source: Ambulatory Visit | Attending: Family Medicine | Admitting: Family Medicine

## 2012-06-02 ENCOUNTER — Other Ambulatory Visit: Payer: Self-pay

## 2012-06-02 DIAGNOSIS — Z1231 Encounter for screening mammogram for malignant neoplasm of breast: Secondary | ICD-10-CM

## 2012-06-06 ENCOUNTER — Other Ambulatory Visit: Payer: Self-pay | Admitting: Nurse Practitioner

## 2012-06-06 DIAGNOSIS — M549 Dorsalgia, unspecified: Secondary | ICD-10-CM

## 2012-08-11 DIAGNOSIS — R609 Edema, unspecified: Secondary | ICD-10-CM | POA: Insufficient documentation

## 2012-08-26 DIAGNOSIS — E119 Type 2 diabetes mellitus without complications: Secondary | ICD-10-CM | POA: Insufficient documentation

## 2012-08-26 DIAGNOSIS — E1165 Type 2 diabetes mellitus with hyperglycemia: Secondary | ICD-10-CM | POA: Insufficient documentation

## 2012-11-26 ENCOUNTER — Other Ambulatory Visit: Payer: Self-pay | Admitting: Pain Medicine

## 2012-11-26 ENCOUNTER — Ambulatory Visit
Admission: RE | Admit: 2012-11-26 | Discharge: 2012-11-26 | Disposition: A | Discharge status: Home / Self Care | Payer: Medicare Other | Source: Ambulatory Visit | Attending: Pain Medicine | Admitting: Pain Medicine

## 2012-11-26 DIAGNOSIS — E079 Disorder of thyroid, unspecified: Secondary | ICD-10-CM | POA: Insufficient documentation

## 2012-11-26 DIAGNOSIS — M25551 Pain in right hip: Secondary | ICD-10-CM

## 2012-12-22 DIAGNOSIS — L659 Nonscarring hair loss, unspecified: Secondary | ICD-10-CM | POA: Insufficient documentation

## 2013-03-06 DIAGNOSIS — Z789 Other specified health status: Secondary | ICD-10-CM | POA: Insufficient documentation

## 2013-03-06 DIAGNOSIS — E668 Other obesity: Secondary | ICD-10-CM | POA: Insufficient documentation

## 2013-03-06 DIAGNOSIS — IMO0002 Reserved for concepts with insufficient information to code with codable children: Secondary | ICD-10-CM | POA: Insufficient documentation

## 2013-03-20 ENCOUNTER — Emergency Department (HOSPITAL_COMMUNITY): Payer: Medicare Other

## 2013-03-20 ENCOUNTER — Encounter (HOSPITAL_COMMUNITY): Payer: Self-pay | Admitting: Emergency Medicine

## 2013-03-20 ENCOUNTER — Emergency Department (HOSPITAL_COMMUNITY)
Admission: EM | Admit: 2013-03-20 | Discharge: 2013-03-20 | Disposition: A | Discharge status: Home / Self Care | Payer: Medicare Other | Attending: Emergency Medicine | Admitting: Emergency Medicine

## 2013-03-20 DIAGNOSIS — F329 Major depressive disorder, single episode, unspecified: Secondary | ICD-10-CM | POA: Insufficient documentation

## 2013-03-20 DIAGNOSIS — G43909 Migraine, unspecified, not intractable, without status migrainosus: Secondary | ICD-10-CM | POA: Insufficient documentation

## 2013-03-20 DIAGNOSIS — E669 Obesity, unspecified: Secondary | ICD-10-CM | POA: Insufficient documentation

## 2013-03-20 DIAGNOSIS — I10 Essential (primary) hypertension: Secondary | ICD-10-CM | POA: Insufficient documentation

## 2013-03-20 DIAGNOSIS — Z791 Long term (current) use of non-steroidal anti-inflammatories (NSAID): Secondary | ICD-10-CM | POA: Insufficient documentation

## 2013-03-20 DIAGNOSIS — E079 Disorder of thyroid, unspecified: Secondary | ICD-10-CM | POA: Insufficient documentation

## 2013-03-20 DIAGNOSIS — R6883 Chills (without fever): Secondary | ICD-10-CM | POA: Insufficient documentation

## 2013-03-20 DIAGNOSIS — Z794 Long term (current) use of insulin: Secondary | ICD-10-CM | POA: Insufficient documentation

## 2013-03-20 DIAGNOSIS — F3289 Other specified depressive episodes: Secondary | ICD-10-CM | POA: Insufficient documentation

## 2013-03-20 DIAGNOSIS — R109 Unspecified abdominal pain: Secondary | ICD-10-CM | POA: Insufficient documentation

## 2013-03-20 DIAGNOSIS — R112 Nausea with vomiting, unspecified: Secondary | ICD-10-CM

## 2013-03-20 DIAGNOSIS — K921 Melena: Secondary | ICD-10-CM | POA: Insufficient documentation

## 2013-03-20 DIAGNOSIS — Z79899 Other long term (current) drug therapy: Secondary | ICD-10-CM | POA: Insufficient documentation

## 2013-03-20 DIAGNOSIS — R0602 Shortness of breath: Secondary | ICD-10-CM | POA: Insufficient documentation

## 2013-03-20 DIAGNOSIS — E119 Type 2 diabetes mellitus without complications: Secondary | ICD-10-CM | POA: Insufficient documentation

## 2013-03-20 DIAGNOSIS — R197 Diarrhea, unspecified: Secondary | ICD-10-CM | POA: Insufficient documentation

## 2013-03-20 DIAGNOSIS — R609 Edema, unspecified: Secondary | ICD-10-CM | POA: Insufficient documentation

## 2013-03-20 LAB — COMPREHENSIVE METABOLIC PANEL WITH GFR
GFR calc non Af Amer: 76 mL/min — ABNORMAL LOW (ref >90)
Glucose, Bld: 209 mg/dL — ABNORMAL HIGH (ref 70–99)
Potassium: 4.2 meq/L (ref 3.7–5.3)
Sodium: 135 meq/L — ABNORMAL LOW (ref 137–147)
Total Bilirubin: 0.3 mg/dL (ref 0.3–1.2)

## 2013-03-20 LAB — COMPREHENSIVE METABOLIC PANEL
ALT: 24 U/L (ref 0–35)
AST: 32 U/L (ref 0–37)
Albumin: 3.7 g/dL (ref 3.5–5.2)
Alkaline Phosphatase: 98 U/L (ref 39–117)
BUN: 13 mg/dL (ref 6–23)
CO2: 25 mEq/L (ref 19–32)
Calcium: 10 mg/dL (ref 8.4–10.5)
Chloride: 95 mEq/L — ABNORMAL LOW (ref 96–112)
Creatinine, Ser: 0.92 mg/dL (ref 0.50–1.10)
GFR calc Af Amer: 88 mL/min — ABNORMAL LOW (ref >90)
Total Protein: 8.7 g/dL — ABNORMAL HIGH (ref 6.0–8.3)

## 2013-03-20 LAB — CBC WITH DIFFERENTIAL/PLATELET
Basophils Absolute: 0 10*3/uL (ref 0.0–0.1)
Basophils Relative: 0 % (ref 0–1)
Eosinophils Absolute: 0.1 10*3/uL (ref 0.0–0.7)
Eosinophils Relative: 0 % (ref 0–5)
HCT: 40.5 % (ref 36.0–46.0)
Hemoglobin: 12.7 g/dL (ref 12.0–15.0)
Lymphocytes Relative: 22 % (ref 12–46)
Lymphs Abs: 2.5 10*3/uL (ref 0.7–4.0)
MCH: 26.7 pg (ref 26.0–34.0)
MCHC: 31.4 g/dL (ref 30.0–36.0)
MCV: 85.1 fL (ref 78.0–100.0)
Monocytes Absolute: 0.4 10*3/uL (ref 0.1–1.0)
Monocytes Relative: 4 % (ref 3–12)
Neutro Abs: 8.3 10*3/uL — ABNORMAL HIGH (ref 1.7–7.7)
Neutrophils Relative %: 73 % (ref 43–77)
Platelets: 417 10*3/uL — ABNORMAL HIGH (ref 150–400)
RBC: 4.76 MIL/uL (ref 3.87–5.11)
RDW: 14.6 % (ref 11.5–15.5)
WBC: 11.3 10*3/uL — ABNORMAL HIGH (ref 4.0–10.5)

## 2013-03-20 LAB — CBG MONITORING, ED: Glucose-Capillary: 159 mg/dL — ABNORMAL HIGH (ref 70–99)

## 2013-03-20 MED ORDER — LOPERAMIDE HCL 2 MG PO CAPS
4.0000 mg | ORAL_CAPSULE | Freq: Once | ORAL | Status: AC
  Administered 2013-03-20: 4 mg via ORAL
  Filled 2013-03-20: qty 2

## 2013-03-20 MED ORDER — SODIUM CHLORIDE 0.9 % IV BOLUS (SEPSIS)
1000.0000 mL | Freq: Once | INTRAVENOUS | Status: AC
  Administered 2013-03-20: 1000 mL via INTRAVENOUS

## 2013-03-20 MED ORDER — ONDANSETRON HCL 4 MG PO TABS: 4.0000 mg | ORAL_TABLET | Freq: Four times a day (QID) | ORAL | Status: DC

## 2013-03-20 MED ORDER — ONDANSETRON HCL 4 MG/2ML IJ SOLN
4.0000 mg | Freq: Once | INTRAMUSCULAR | Status: AC
  Administered 2013-03-20: 4 mg via INTRAVENOUS
  Filled 2013-03-20: qty 2

## 2013-03-20 NOTE — ED Provider Notes (Signed)
 CSN: [id number]     Arrival date & time 03/20/13  0745 History   First MD Initiated Contact with Patient 03/20/13 [phone]     Chief Complaint  Patient presents with  . Emesis  . Diarrhea     (Consider location/radiation/quality/duration/timing/severity/associated sxs/prior Treatment) HPI  This a 43 year old female with a history of diabetes, hypertension, thyroid disease who presents with multiple complaints. Patient reports the onset of nausea, vomiting, and diarrhea for the last 3 hours. Patient also reports shortness of breath. She reports diffuse abdominal pain. She denies any sick contacts. Regarding the patient's shortness of breath she has no associated chest pain. She denies any fevers or cough.  Patient reports diffuse abdominal pain that is nonradiating. She rates it a 10 out of 10. Nothing makes it better or worse. She denies any blood in her stools.  Past Medical History  Diagnosis Date  . Diabetes mellitus   . Hypertension   . Thyroid disease   . Migraine   . Enlarged heart   . Depression    Past Surgical History  Procedure Laterality Date  . Abdominal hysterectomy    . Knee surgery    . Gastric bypass     Family History  Problem Relation Age of Onset  . Heart disease    . Hypertension Mother   . Diabetes Father   . Hypertension Father   . Arthritis     History  Substance Use Topics  . Smoking status: Never Smoker   . Smokeless tobacco: Never Used  . Alcohol Use: No   OB History   Grav Para Term Preterm Abortions TAB SAB Ect Mult Living   4 2 2  2  2   2      Review of Systems  Constitutional: Positive for chills. Negative for fever.  Respiratory: Positive for shortness of breath. Negative for cough and chest tightness.   Cardiovascular: Negative for chest pain and leg swelling.  Gastrointestinal: Positive for nausea, vomiting, abdominal pain, diarrhea and blood in stool.  Genitourinary: Negative for dysuria.  Musculoskeletal: Negative for back pain.   Skin: Negative for wound.  Neurological: Negative for headaches.  Psychiatric/Behavioral: Negative for confusion.  All other systems reviewed and are negative.      Allergies  Meloxicam and Morphine  Home Medications   Current Outpatient Rx  Name  Route  Sig  Dispense  Refill  . Ascorbic Acid (VITAMIN C DROPS MT)   Mouth/Throat   Use as directed 1-2 drops in the mouth or throat daily.         Marland Kitchen buPROPion (WELLBUTRIN XL) 300 MG 24 hr tablet   Oral   Take 300 mg by mouth daily.         . clonazePAM (KLONOPIN) 1 MG tablet   Oral   Take 1-2 mg by mouth daily as needed for anxiety.         Marland Kitchen estradiol (ESTRACE) 1 MG tablet   Oral   Take 1 mg by mouth daily.         Marland Kitchen HYDROcodone-acetaminophen (NORCO) 10-325 MG per tablet   Oral   Take 1 tablet by mouth every 6 (six) hours as needed for moderate pain.          Marland Kitchen insulin regular (NOVOLIN R,HUMULIN R) 100 units/mL injection   Subcutaneous   Inject 15 Units into the skin daily as needed for high blood sugar.          . levothyroxine (SYNTHROID, LEVOTHROID) 125 MCG  tablet   Oral   Take 125 mcg by mouth daily.         . metoprolol succinate (TOPROL-XL) 25 MG 24 hr tablet   Oral   Take 25 mg by mouth daily.         . naproxen sodium (ANAPROX) 220 MG tablet   Oral   Take 220 mg by mouth 3 (three) times daily as needed (inflammation in back.).         Marland Kitchen ondansetron (ZOFRAN) 4 MG tablet   Oral   Take 1 tablet (4 mg total) by mouth every 6 (six) hours.   12 tablet   0    BP 109/71  Pulse 89  Temp(Src) 98.5 F (36.9 C) (Oral)  Resp 20  Wt 330 lb (149.687 kg)  SpO2 100% Physical Exam  Nursing note and vitals reviewed. Constitutional: She is oriented to person, place, and time. No distress.  Obese  HENT:  Head: Normocephalic and atraumatic.  Mouth/Throat: Oropharynx is clear and moist.  Eyes: Pupils are equal, round, and reactive to light.  Neck: Neck supple.  Cardiovascular: Normal rate,  regular rhythm and normal heart sounds.   No murmur heard. Pulmonary/Chest: Effort normal and breath sounds normal. No respiratory distress. She has no wheezes. She has no rales.  Abdominal: Soft. Bowel sounds are normal. There is no tenderness. There is no rebound and no guarding.  Musculoskeletal:  Trace bilateral lower extremity edema  Neurological: She is alert and oriented to person, place, and time.  Skin: Skin is warm and dry.  Psychiatric: She has a normal mood and affect.    ED Course  Procedures (including critical care time) Labs Review Labs Reviewed  CBC WITH DIFFERENTIAL - Abnormal; Notable for the following:    WBC 11.3 (*)    Platelets 417 (*)    Neutro Abs 8.3 (*)    All other components within normal limits  COMPREHENSIVE METABOLIC PANEL - Abnormal; Notable for the following:    Sodium 135 (*)    Chloride 95 (*)    Glucose, Bld 209 (*)    Total Protein 8.7 (*)    GFR calc non Af Amer 76 (*)    GFR calc Af Amer 88 (*)    All other components within normal limits  CBG MONITORING, ED - Abnormal; Notable for the following:    Glucose-Capillary 159 (*)    All other components within normal limits   Imaging Review Dg Chest 2 View  03/20/2013   CLINICAL DATA:  Cough, vomiting, diarrhea  EXAM: CHEST  2 VIEW  COMPARISON:  CT chest of 03/14/2011 and chest x-ray of the same date  FINDINGS: No active infiltrate or effusion is seen. Mediastinal contours are stable. The heart is within upper limits of normal. No acute bony abnormality is seen.  IMPRESSION: No active lung disease.   Electronically Signed   By: Ivar Drape M.D.   On: 03/20/2013 08:53    EKG Interpretation    Date/Time:  Friday March 20 2013 08:38:38 EST Ventricular Rate:  88 PR Interval:  172 QRS Duration: 94 QT Interval:  394 QTC Calculation: 476 R Axis:   36 Text Interpretation:  Normal sinus rhythm Normal ECG When compared with ECG of 14-Mar-2011 11:32, No significant change was found Confirmed by    MD,  ([id number]) on 03/20/2013 8:43:54 AM            MDM   Final diagnoses:  Nausea vomiting and diarrhea  Patient presents with nausea, vomiting, diarrhea and abdominal pain. She is nontoxic-appearing on exam.  Vital signs are reassuring. EKG is nonischemic and chest x-ray is negative. Basic labwork is reassuring. Patient was given Zofran and fluids with improvement of her symptoms. Suspect patient's symptoms are likely secondary to a viral gastroenteritis. Patient be discharged home and was advised that she will likely have continued symptoms for the next 1-2 days. She was given dehydration precautions.  After history, exam, and medical workup I feel the patient has been appropriately medically screened and is safe for discharge home. Pertinent diagnoses were discussed with the patient. Patient was given return precautions.    Merryl Hacker, MD 03/20/13 1014

## 2013-03-20 NOTE — ED Notes (Signed)
Pt up to bathroom prior to being able to draw labs or obtain IV access.

## 2013-03-20 NOTE — Discharge Instructions (Signed)
Viral Gastroenteritis Viral gastroenteritis is also known as stomach flu. This condition affects the stomach and intestinal tract. It can cause sudden diarrhea and vomiting. The illness typically lasts 3 to 8 days. Most people develop an immune response that eventually gets rid of the virus. While this natural response develops, the virus can make you quite ill. CAUSES  Many different viruses can cause gastroenteritis, such as rotavirus or noroviruses. You can catch one of these viruses by consuming contaminated food or water. You may also catch a virus by sharing utensils or other personal items with an infected person or by touching a contaminated surface. SYMPTOMS  The most common symptoms are diarrhea and vomiting. These problems can cause a severe loss of body fluids (dehydration) and a body salt (electrolyte) imbalance. Other symptoms may include:  Fever.  Headache.  Fatigue.  Abdominal pain. DIAGNOSIS  Your caregiver can usually diagnose viral gastroenteritis based on your symptoms and a physical exam. A stool sample may also be taken to test for the presence of viruses or other infections. TREATMENT  This illness typically goes away on its own. Treatments are aimed at rehydration. The most serious cases of viral gastroenteritis involve vomiting so severely that you are not able to keep fluids down. In these cases, fluids must be given through an intravenous line (IV). HOME CARE INSTRUCTIONS   Drink enough fluids to keep your urine clear or pale yellow. Drink small amounts of fluids frequently and increase the amounts as tolerated.  Ask your caregiver for specific rehydration instructions.  Avoid:  Foods high in sugar.  Alcohol.  Carbonated drinks.  Tobacco.  Juice.  Caffeine drinks.  Extremely hot or cold fluids.  Fatty, greasy foods.  Too much intake of anything at one time.  Dairy products until 24 to 48 hours after diarrhea stops.  You may consume probiotics.  Probiotics are active cultures of beneficial bacteria. They may lessen the amount and number of diarrheal stools in adults. Probiotics can be found in yogurt with active cultures and in supplements.  Wash your hands well to avoid spreading the virus.  Only take over-the-counter or prescription medicines for pain, discomfort, or fever as directed by your caregiver. Do not give aspirin to children. Antidiarrheal medicines are not recommended.  Ask your caregiver if you should continue to take your regular prescribed and over-the-counter medicines.  Keep all follow-up appointments as directed by your caregiver. SEEK IMMEDIATE MEDICAL CARE IF:   You are unable to keep fluids down.  You do not urinate at least once every 6 to 8 hours.  You develop shortness of breath.  You notice blood in your stool or vomit. This may look like coffee grounds.  You have abdominal pain that increases or is concentrated in one small area (localized).  You have persistent vomiting or diarrhea.  You have a fever.  The patient is a child younger than 3 months, and he or she has a fever.  The patient is a child older than 3 months, and he or she has a fever and persistent symptoms.  The patient is a child older than 3 months, and he or she has a fever and symptoms suddenly get worse.  The patient is a baby, and he or she has no tears when crying. MAKE SURE YOU:   Understand these instructions.  Will watch your condition.  Will get help right away if you are not doing well or get worse. Document Released: 01/15/2005 Document Revised: 04/09/2011 Document Reviewed: 11/01/2010   [employer] Patient Information 2014 [employer].  

## 2013-03-20 NOTE — ED Notes (Signed)
Alert, in no distress; denies nausea; no vomiting or diarrhea since administration of meds.  Able to tolerate po fluids.  Instructions, prescriptions, and f/u information given, reviewed - verbalizes understanding

## 2013-03-20 NOTE — ED Notes (Signed)
Pt c/o headache/n/v/d x 3 hrs and sob. C/o left side abd pain also. N/v x 15. Diarrhea x15 per pt. No resp distress noted. Breathing hard, encouraged slow deep breathing. Mm wet

## 2013-04-21 DIAGNOSIS — E1165 Type 2 diabetes mellitus with hyperglycemia: Secondary | ICD-10-CM | POA: Insufficient documentation

## 2013-09-02 ENCOUNTER — Other Ambulatory Visit: Payer: Self-pay

## 2013-09-02 DIAGNOSIS — Z1231 Encounter for screening mammogram for malignant neoplasm of breast: Secondary | ICD-10-CM

## 2013-09-14 ENCOUNTER — Ambulatory Visit
Admission: RE | Admit: 2013-09-14 | Discharge: 2013-09-14 | Disposition: A | Discharge status: Home / Self Care | Payer: Medicare Other | Source: Ambulatory Visit

## 2013-09-14 DIAGNOSIS — Z1231 Encounter for screening mammogram for malignant neoplasm of breast: Secondary | ICD-10-CM

## 2013-09-15 ENCOUNTER — Other Ambulatory Visit: Payer: Self-pay | Admitting: Nurse Practitioner

## 2013-09-15 DIAGNOSIS — R928 Other abnormal and inconclusive findings on diagnostic imaging of breast: Secondary | ICD-10-CM

## 2013-09-19 DIAGNOSIS — F3289 Other specified depressive episodes: Secondary | ICD-10-CM | POA: Diagnosis not present

## 2013-09-19 DIAGNOSIS — Z791 Long term (current) use of non-steroidal anti-inflammatories (NSAID): Secondary | ICD-10-CM | POA: Insufficient documentation

## 2013-09-19 DIAGNOSIS — Z794 Long term (current) use of insulin: Secondary | ICD-10-CM | POA: Diagnosis not present

## 2013-09-19 DIAGNOSIS — I10 Essential (primary) hypertension: Secondary | ICD-10-CM | POA: Diagnosis not present

## 2013-09-19 DIAGNOSIS — Z79899 Other long term (current) drug therapy: Secondary | ICD-10-CM | POA: Diagnosis not present

## 2013-09-19 DIAGNOSIS — E079 Disorder of thyroid, unspecified: Secondary | ICD-10-CM | POA: Diagnosis not present

## 2013-09-19 DIAGNOSIS — R0602 Shortness of breath: Secondary | ICD-10-CM | POA: Diagnosis not present

## 2013-09-19 DIAGNOSIS — F329 Major depressive disorder, single episode, unspecified: Secondary | ICD-10-CM | POA: Insufficient documentation

## 2013-09-19 DIAGNOSIS — M79609 Pain in unspecified limb: Secondary | ICD-10-CM | POA: Insufficient documentation

## 2013-09-19 DIAGNOSIS — R609 Edema, unspecified: Secondary | ICD-10-CM | POA: Diagnosis not present

## 2013-09-19 DIAGNOSIS — Z9889 Other specified postprocedural states: Secondary | ICD-10-CM | POA: Insufficient documentation

## 2013-09-19 DIAGNOSIS — G43909 Migraine, unspecified, not intractable, without status migrainosus: Secondary | ICD-10-CM | POA: Diagnosis not present

## 2013-09-19 DIAGNOSIS — E119 Type 2 diabetes mellitus without complications: Secondary | ICD-10-CM | POA: Diagnosis not present

## 2013-09-20 ENCOUNTER — Emergency Department (HOSPITAL_COMMUNITY)
Admission: EM | Admit: 2013-09-20 | Discharge: 2013-09-20 | Disposition: A | Discharge status: Home / Self Care | Payer: Medicare Other | Attending: Emergency Medicine | Admitting: Emergency Medicine

## 2013-09-20 DIAGNOSIS — R609 Edema, unspecified: Secondary | ICD-10-CM

## 2013-09-20 DIAGNOSIS — R600 Localized edema: Secondary | ICD-10-CM

## 2013-09-20 LAB — D-DIMER, QUANTITATIVE: D-Dimer, Quant: 0.35 ug/mL-FEU (ref 0.00–0.48)

## 2013-09-20 LAB — CBC WITH DIFFERENTIAL/PLATELET
Basophils Absolute: 0.1 10*3/uL (ref 0.0–0.1)
Basophils Relative: 1 % (ref 0–1)
Eosinophils Absolute: 0.3 10*3/uL (ref 0.0–0.7)
Eosinophils Relative: 4 % (ref 0–5)
HCT: 37.2 % (ref 36.0–46.0)
Hemoglobin: 11.8 g/dL — ABNORMAL LOW (ref 12.0–15.0)
Lymphocytes Relative: 46 % (ref 12–46)
Lymphs Abs: 3.8 10*3/uL (ref 0.7–4.0)
MCH: 26.5 pg (ref 26.0–34.0)
MCHC: 31.7 g/dL (ref 30.0–36.0)
MCV: 83.4 fL (ref 78.0–100.0)
Monocytes Absolute: 0.4 10*3/uL (ref 0.1–1.0)
Monocytes Relative: 5 % (ref 3–12)
Neutro Abs: 3.6 10*3/uL (ref 1.7–7.7)
Neutrophils Relative %: 44 % (ref 43–77)
Platelets: 301 10*3/uL (ref 150–400)
RBC: 4.46 MIL/uL (ref 3.87–5.11)
RDW: 14.4 % (ref 11.5–15.5)
WBC: 8.2 10*3/uL (ref 4.0–10.5)

## 2013-09-20 LAB — COMPREHENSIVE METABOLIC PANEL
ALT: 11 U/L (ref 0–35)
AST: 17 U/L (ref 0–37)
Albumin: 3.3 g/dL — ABNORMAL LOW (ref 3.5–5.2)
Anion gap: 12 (ref 5–15)
BUN: 12 mg/dL (ref 6–23)
Calcium: 9.2 mg/dL (ref 8.4–10.5)
Chloride: 96 mEq/L (ref 96–112)
Creatinine, Ser: 0.81 mg/dL (ref 0.50–1.10)
Glucose, Bld: 114 mg/dL — ABNORMAL HIGH (ref 70–99)
Total Bilirubin: 0.2 mg/dL — ABNORMAL LOW (ref 0.3–1.2)

## 2013-09-20 LAB — COMPREHENSIVE METABOLIC PANEL WITH GFR
Alkaline Phosphatase: 90 U/L (ref 39–117)
CO2: 29 meq/L (ref 19–32)
GFR calc Af Amer: >90 mL/min (ref >90)
GFR calc non Af Amer: 88 mL/min — ABNORMAL LOW (ref >90)
Potassium: 3.8 meq/L (ref 3.7–5.3)
Sodium: 137 meq/L (ref 137–147)
Total Protein: 7.9 g/dL (ref 6.0–8.3)

## 2013-09-20 LAB — URINALYSIS, ROUTINE W REFLEX MICROSCOPIC
Bilirubin Urine: NEGATIVE
Glucose, UA: NEGATIVE mg/dL
Hgb urine dipstick: NEGATIVE
Ketones, ur: NEGATIVE mg/dL
Leukocytes, UA: NEGATIVE
Nitrite: NEGATIVE
Protein, ur: NEGATIVE mg/dL
Specific Gravity, Urine: 1.01 (ref 1.005–1.030)
Urobilinogen, UA: 0.2 mg/dL (ref 0.0–1.0)
pH: 6 (ref 5.0–8.0)

## 2013-09-20 LAB — TROPONIN I: Troponin I: <0.3 ng/mL (ref <0.30)

## 2013-09-20 LAB — LIPASE, BLOOD: Lipase: 25 U/L (ref 11–59)

## 2013-09-20 LAB — PRO B NATRIURETIC PEPTIDE: Pro B Natriuretic peptide (BNP): 22.5 pg/mL (ref 0–125)

## 2013-09-20 NOTE — Discharge Instructions (Signed)
Peripheral Edema °You have swelling in your legs (peripheral edema). This swelling is due to excess accumulation of salt and water in your body. Edema may be a sign of heart, kidney or liver disease, or a side effect of a medication. It may also be due to problems in the leg veins. Elevating your legs and using special support stockings may be very helpful, if the cause of the swelling is due to poor venous circulation. Avoid long periods of standing, whatever the cause. °Treatment of edema depends on identifying the cause. Chips, pretzels, pickles and other salty foods should be avoided. Restricting salt in your diet is almost always needed. Water pills (diuretics) are often used to remove the excess salt and water from your body via urine. These medicines prevent the kidney from reabsorbing sodium. This increases urine flow. °Diuretic treatment may also result in lowering of potassium levels in your body. Potassium supplements may be needed if you have to use diuretics daily. Daily weights can help you keep track of your progress in clearing your edema. You should call your caregiver for follow up care as recommended. °SEEK IMMEDIATE MEDICAL CARE IF:  °· You have increased swelling, pain, redness, or heat in your legs. °· You develop shortness of breath, especially when lying down. °· You develop chest or abdominal pain, weakness, or fainting. °· You have a fever. °Document Released: 02/23/2004 Document Revised: 04/09/2011 Document Reviewed: 02/02/2009 °[employer]® Patient Information ©2015 [employer]. This information is not intended to replace advice given to you by your health care provider. Make sure you discuss any questions you have with your health care provider. ° °

## 2013-09-20 NOTE — ED Notes (Signed)
Pt c/o swelling and itching to bilat lower legs x 3 days, states is also painful

## 2013-09-21 NOTE — ED Provider Notes (Signed)
CSN: [id number]     Arrival date & time 09/19/13  2354 History   First MD Initiated Contact with Patient 09/20/13 0111     Chief Complaint  Patient presents with  . Leg Pain     (Consider location/radiation/quality/duration/timing/severity/associated sxs/prior Treatment) HPI Patient presents with itching and pins and needle sensation to the into surface of her lower extremities for the past 3 days. She states she's had swelling bilaterally for several weeks. She has tenderness. She has mild dyspnea but this is unchanged. Denies any cough. She's had no chest pain. Patient has history of long-standing diabetes. No recent extended travel or surgeries. No family history of from embolic disease. Past Medical History  Diagnosis Date  . Diabetes mellitus   . Hypertension   . Thyroid disease   . Migraine   . Enlarged heart   . Depression    Past Surgical History  Procedure Laterality Date  . Abdominal hysterectomy    . Knee surgery    . Gastric bypass     Family History  Problem Relation Age of Onset  . Heart disease    . Hypertension Mother   . Diabetes Father   . Hypertension Father   . Arthritis     History  Substance Use Topics  . Smoking status: Never Smoker   . Smokeless tobacco: Never Used  . Alcohol Use: No   OB History   Grav Para Term Preterm Abortions TAB SAB Ect Mult Living   4 2 2  2  2   2      Review of Systems  Constitutional: Negative for fever and chills.  Respiratory: Positive for shortness of breath. Negative for cough.   Cardiovascular: Positive for leg swelling. Negative for chest pain and palpitations.  Gastrointestinal: Negative for nausea, vomiting and abdominal pain.  Musculoskeletal: Negative for back pain, neck pain and neck stiffness.  Skin: Negative for rash and wound.  Neurological: Negative for dizziness, weakness, numbness and headaches.  All other systems reviewed and are negative.     Allergies  Meloxicam and Morphine  Home  Medications   Prior to Admission medications   Medication Sig Start Date End Date Taking? Authorizing Provider  amitriptyline (ELAVIL) 150 MG tablet Take 150 mg by mouth at bedtime.   Yes Historical Provider, MD  Ascorbic Acid (VITAMIN C DROPS MT) Use as directed 1-2 drops in the mouth or throat daily.   Yes Historical Provider, MD  buPROPion (WELLBUTRIN XL) 300 MG 24 hr tablet Take 300 mg by mouth daily.   Yes Historical Provider, MD  clonazePAM (KLONOPIN) 1 MG tablet Take 1-2 mg by mouth daily as needed for anxiety.   Yes Historical Provider, MD  estradiol (ESTRACE) 1 MG tablet Take 1 mg by mouth daily.   Yes Historical Provider, MD  HYDROcodone-acetaminophen (NORCO) 10-325 MG per tablet Take 1 tablet by mouth every 6 (six) hours as needed for moderate pain.    Yes Historical Provider, MD  insulin regular (NOVOLIN R,HUMULIN R) 100 units/mL injection Inject 15 Units into the skin daily as needed for high blood sugar.    Yes Historical Provider, MD  levothyroxine (SYNTHROID, LEVOTHROID) 125 MCG tablet Take 125 mcg by mouth daily.   Yes Historical Provider, MD  metoprolol succinate (TOPROL-XL) 25 MG 24 hr tablet Take 25 mg by mouth daily.   Yes Historical Provider, MD  naproxen sodium (ANAPROX) 220 MG tablet Take 220 mg by mouth 3 (three) times daily as needed (inflammation in back.).  Yes Historical Provider, MD  ondansetron (ZOFRAN) 4 MG tablet Take 1 tablet (4 mg total) by mouth every 6 (six) hours. 03/20/13  Yes Merryl Hacker, MD   BP 115/76  Pulse 82  Temp(Src) 98.8 F (37.1 C) (Oral)  Resp 18  SpO2 96% Physical Exam  Nursing note and vitals reviewed. Constitutional: She is oriented to person, place, and time. She appears well-developed and well-nourished. No distress.  HENT:  Head: Normocephalic and atraumatic.  Mouth/Throat: Oropharynx is clear and moist.  Eyes: EOM are normal. Pupils are equal, round, and reactive to light.  Neck: Normal range of motion. Neck supple. No JVD  present.  Cardiovascular: Normal rate and regular rhythm.   Pulmonary/Chest: Effort normal and breath sounds normal. No respiratory distress. She has no wheezes. She has no rales. She exhibits no tenderness.  Abdominal: Soft. Bowel sounds are normal. She exhibits no distension and no mass. There is no tenderness. There is no rebound and no guarding.  Musculoskeletal: Normal range of motion. She exhibits edema (2+ bilateral pitting edema up to the proximal posterior. No specific calf tenderness to palpation.). She exhibits no tenderness.  No CVA tenderness. 2+ dorsalis is pulses  Neurological: She is alert and oriented to person, place, and time.  Moves all extremities without deficit. Sensation grossly intact.  Skin: Skin is warm and dry. No rash noted. No erythema.  Psychiatric: She has a normal mood and affect. Her behavior is normal.    ED Course  Procedures (including critical care time) Labs Review Labs Reviewed  CBC WITH DIFFERENTIAL - Abnormal; Notable for the following:    Hemoglobin 11.8 (*)    All other components within normal limits  COMPREHENSIVE METABOLIC PANEL - Abnormal; Notable for the following:    Glucose, Bld 114 (*)    Albumin 3.3 (*)    Total Bilirubin 0.2 (*)    GFR calc non Af Amer 88 (*)    All other components within normal limits  URINALYSIS, ROUTINE W REFLEX MICROSCOPIC  PRO B NATRIURETIC PEPTIDE  TROPONIN I  D-DIMER, QUANTITATIVE  LIPASE, BLOOD    Imaging Review No results found.   EKG Interpretation None      MDM   Final diagnoses:  Peripheral edema   Negative workup for congestive heart failure and DVT. Patient with peripheral edema likely from venous insufficiency. Advised to continue wearing stockings and keep the feet elevated. She is to followup with her primary Dr. Itching and paresthesias to lower extremities may be developing peripheral neuropathy given her history of diabetes. Again she is advised to followup with primary Dr. Return  precautions given.    Julianne Rice, MD 09/21/13 [phone]

## 2013-09-30 ENCOUNTER — Ambulatory Visit
Admission: RE | Admit: 2013-09-30 | Discharge: 2013-09-30 | Disposition: A | Discharge status: Home / Self Care | Payer: Medicare Other | Source: Ambulatory Visit | Attending: Nurse Practitioner | Admitting: Nurse Practitioner

## 2013-09-30 DIAGNOSIS — R928 Other abnormal and inconclusive findings on diagnostic imaging of breast: Secondary | ICD-10-CM

## 2013-11-30 ENCOUNTER — Encounter (HOSPITAL_COMMUNITY): Payer: Self-pay | Admitting: Emergency Medicine

## 2013-12-10 ENCOUNTER — Other Ambulatory Visit: Payer: Self-pay | Admitting: Nurse Practitioner

## 2013-12-10 DIAGNOSIS — M5416 Radiculopathy, lumbar region: Secondary | ICD-10-CM

## 2013-12-10 DIAGNOSIS — M79604 Pain in right leg: Secondary | ICD-10-CM

## 2013-12-10 DIAGNOSIS — M545 Low back pain, unspecified: Secondary | ICD-10-CM

## 2013-12-10 DIAGNOSIS — G8929 Other chronic pain: Secondary | ICD-10-CM

## 2013-12-10 DIAGNOSIS — M79605 Pain in left leg: Secondary | ICD-10-CM

## 2013-12-14 ENCOUNTER — Ambulatory Visit
Admission: RE | Admit: 2013-12-14 | Discharge: 2013-12-14 | Disposition: A | Discharge status: Home / Self Care | Payer: Medicare Other | Source: Ambulatory Visit | Attending: Nurse Practitioner | Admitting: Nurse Practitioner

## 2013-12-14 DIAGNOSIS — M5416 Radiculopathy, lumbar region: Secondary | ICD-10-CM

## 2013-12-14 DIAGNOSIS — M79605 Pain in left leg: Secondary | ICD-10-CM

## 2013-12-14 DIAGNOSIS — M545 Low back pain, unspecified: Secondary | ICD-10-CM

## 2013-12-14 DIAGNOSIS — G8929 Other chronic pain: Secondary | ICD-10-CM

## 2013-12-14 DIAGNOSIS — M79604 Pain in right leg: Secondary | ICD-10-CM

## 2013-12-14 MED ORDER — IOHEXOL 180 MG/ML  SOLN
1.0000 mL | Freq: Once | INTRAMUSCULAR | Status: AC | PRN
  Administered 2013-12-14: 1 mL via EPIDURAL

## 2013-12-14 MED ORDER — METHYLPREDNISOLONE ACETATE 40 MG/ML INJ SUSP (RADIOLOG
120.0000 mg | Freq: Once | INTRAMUSCULAR | Status: AC
  Administered 2013-12-14: 120 mg via EPIDURAL

## 2014-02-12 ENCOUNTER — Other Ambulatory Visit: Payer: Self-pay | Admitting: Nurse Practitioner

## 2014-02-12 DIAGNOSIS — N63 Unspecified lump in unspecified breast: Secondary | ICD-10-CM

## 2014-04-02 ENCOUNTER — Other Ambulatory Visit: Payer: Medicare Other

## 2014-04-02 ENCOUNTER — Ambulatory Visit
Admission: RE | Admit: 2014-04-02 | Discharge: 2014-04-02 | Disposition: A | Discharge status: Home / Self Care | Payer: Medicare Other | Source: Ambulatory Visit | Attending: Nurse Practitioner | Admitting: Nurse Practitioner

## 2014-04-02 DIAGNOSIS — N63 Unspecified lump in unspecified breast: Secondary | ICD-10-CM

## 2014-04-28 ENCOUNTER — Other Ambulatory Visit: Payer: Self-pay

## 2014-04-28 DIAGNOSIS — Z1231 Encounter for screening mammogram for malignant neoplasm of breast: Secondary | ICD-10-CM

## 2014-07-09 ENCOUNTER — Other Ambulatory Visit: Payer: Self-pay | Admitting: Nurse Practitioner

## 2014-07-09 DIAGNOSIS — M545 Low back pain, unspecified: Secondary | ICD-10-CM

## 2014-07-09 DIAGNOSIS — G8929 Other chronic pain: Secondary | ICD-10-CM

## 2014-07-14 ENCOUNTER — Ambulatory Visit
Admission: RE | Admit: 2014-07-14 | Discharge: 2014-07-14 | Disposition: A | Discharge status: Home / Self Care | Payer: Medicare Other | Source: Ambulatory Visit | Attending: Nurse Practitioner | Admitting: Nurse Practitioner

## 2014-07-14 DIAGNOSIS — G8929 Other chronic pain: Secondary | ICD-10-CM

## 2014-07-14 DIAGNOSIS — M545 Low back pain, unspecified: Secondary | ICD-10-CM

## 2014-07-14 MED ORDER — IOHEXOL 180 MG/ML  SOLN
1.0000 mL | Freq: Once | INTRAMUSCULAR | Status: AC | PRN
Start: 2014-07-14 — End: 2014-07-14
  Administered 2014-07-14: 1 mL via EPIDURAL

## 2014-07-14 MED ORDER — METHYLPREDNISOLONE ACETATE 40 MG/ML INJ SUSP (RADIOLOG
120.0000 mg | Freq: Once | INTRAMUSCULAR | Status: AC
  Administered 2014-07-14: 120 mg via EPIDURAL

## 2014-08-12 ENCOUNTER — Other Ambulatory Visit: Payer: Self-pay | Admitting: Orthopedic Surgery

## 2014-08-12 DIAGNOSIS — M25511 Pain in right shoulder: Secondary | ICD-10-CM

## 2014-09-08 ENCOUNTER — Ambulatory Visit
Admission: RE | Admit: 2014-09-08 | Discharge: 2014-09-08 | Disposition: A | Discharge status: Home / Self Care | Payer: Medicare Other | Source: Ambulatory Visit | Attending: Orthopedic Surgery | Admitting: Orthopedic Surgery

## 2014-09-08 DIAGNOSIS — M25511 Pain in right shoulder: Secondary | ICD-10-CM

## 2014-09-08 MED ORDER — IOHEXOL 180 MG/ML  SOLN
15.0000 mL | Freq: Once | INTRAMUSCULAR | Status: DC | PRN
  Administered 2014-09-08: 15 mL via INTRA_ARTICULAR

## 2014-10-05 ENCOUNTER — Ambulatory Visit: Payer: Medicare Other

## 2014-10-14 ENCOUNTER — Other Ambulatory Visit: Payer: Self-pay

## 2014-10-14 ENCOUNTER — Other Ambulatory Visit: Payer: Self-pay | Admitting: Nurse Practitioner

## 2014-10-14 DIAGNOSIS — Z1231 Encounter for screening mammogram for malignant neoplasm of breast: Secondary | ICD-10-CM

## 2014-10-26 ENCOUNTER — Ambulatory Visit
Admission: RE | Admit: 2014-10-26 | Discharge: 2014-10-26 | Disposition: A | Discharge status: Home / Self Care | Payer: Medicare Other | Source: Ambulatory Visit | Attending: Nurse Practitioner | Admitting: Nurse Practitioner

## 2014-10-26 DIAGNOSIS — Z1231 Encounter for screening mammogram for malignant neoplasm of breast: Secondary | ICD-10-CM

## 2014-12-02 ENCOUNTER — Other Ambulatory Visit: Payer: Self-pay | Admitting: Pain Medicine

## 2014-12-02 DIAGNOSIS — M545 Low back pain: Secondary | ICD-10-CM

## 2014-12-13 ENCOUNTER — Inpatient Hospital Stay: Admission: RE | Admit: 2014-12-13 | Payer: Medicare Other | Source: Ambulatory Visit

## 2014-12-31 ENCOUNTER — Other Ambulatory Visit: Payer: Medicare Other

## 2015-01-12 ENCOUNTER — Other Ambulatory Visit: Payer: Medicare Other

## 2015-05-30 ENCOUNTER — Other Ambulatory Visit: Payer: Self-pay | Admitting: Pain Medicine

## 2015-05-30 DIAGNOSIS — M545 Low back pain, unspecified: Secondary | ICD-10-CM

## 2015-05-30 DIAGNOSIS — G8929 Other chronic pain: Secondary | ICD-10-CM

## 2015-06-07 ENCOUNTER — Other Ambulatory Visit: Payer: Self-pay | Admitting: Pain Medicine

## 2015-06-07 ENCOUNTER — Ambulatory Visit
Admission: RE | Admit: 2015-06-07 | Discharge: 2015-06-07 | Disposition: A | Discharge status: Home / Self Care | Payer: Medicare Other | Source: Ambulatory Visit | Attending: Pain Medicine | Admitting: Pain Medicine

## 2015-06-07 DIAGNOSIS — M545 Low back pain, unspecified: Secondary | ICD-10-CM

## 2015-06-07 DIAGNOSIS — G8929 Other chronic pain: Secondary | ICD-10-CM

## 2015-06-07 MED ORDER — IOPAMIDOL (ISOVUE-M 200) INJECTION 41%
1.0000 mL | Freq: Once | INTRAMUSCULAR | Status: AC
Start: 2015-06-07 — End: 2015-06-07
  Administered 2015-06-07: 1 mL via EPIDURAL

## 2015-06-07 MED ORDER — METHYLPREDNISOLONE ACETATE 40 MG/ML INJ SUSP (RADIOLOG
120.0000 mg | Freq: Once | INTRAMUSCULAR | Status: AC
  Administered 2015-06-07: 120 mg via EPIDURAL

## 2015-11-08 ENCOUNTER — Other Ambulatory Visit: Payer: Self-pay | Admitting: Nurse Practitioner

## 2015-11-08 DIAGNOSIS — Z1231 Encounter for screening mammogram for malignant neoplasm of breast: Secondary | ICD-10-CM

## 2015-11-23 ENCOUNTER — Ambulatory Visit
Admission: RE | Admit: 2015-11-23 | Discharge: 2015-11-23 | Disposition: A | Discharge status: Home / Self Care | Payer: Medicare Other | Source: Ambulatory Visit | Attending: Nurse Practitioner | Admitting: Nurse Practitioner

## 2015-11-23 DIAGNOSIS — Z1231 Encounter for screening mammogram for malignant neoplasm of breast: Secondary | ICD-10-CM

## 2015-12-30 DIAGNOSIS — F419 Anxiety disorder, unspecified: Secondary | ICD-10-CM | POA: Insufficient documentation

## 2016-05-21 ENCOUNTER — Ambulatory Visit (HOSPITAL_COMMUNITY): Payer: Medicare Other | Admitting: Psychiatry

## 2016-07-09 ENCOUNTER — Ambulatory Visit (HOSPITAL_COMMUNITY): Payer: Medicare Other | Admitting: Psychiatry

## 2016-12-02 ENCOUNTER — Emergency Department (HOSPITAL_COMMUNITY): Payer: No Typology Code available for payment source

## 2016-12-02 ENCOUNTER — Other Ambulatory Visit: Payer: Self-pay

## 2016-12-02 ENCOUNTER — Encounter (HOSPITAL_COMMUNITY): Payer: Self-pay | Admitting: *Deleted

## 2016-12-02 ENCOUNTER — Emergency Department (HOSPITAL_COMMUNITY)
Admission: EM | Admit: 2016-12-02 | Discharge: 2016-12-02 | Disposition: A | Discharge status: Home / Self Care | Payer: No Typology Code available for payment source | Attending: Emergency Medicine | Admitting: Emergency Medicine

## 2016-12-02 DIAGNOSIS — E119 Type 2 diabetes mellitus without complications: Secondary | ICD-10-CM | POA: Insufficient documentation

## 2016-12-02 DIAGNOSIS — Z79899 Other long term (current) drug therapy: Secondary | ICD-10-CM | POA: Diagnosis not present

## 2016-12-02 DIAGNOSIS — S20219A Contusion of unspecified front wall of thorax, initial encounter: Secondary | ICD-10-CM | POA: Insufficient documentation

## 2016-12-02 DIAGNOSIS — Y929 Unspecified place or not applicable: Secondary | ICD-10-CM | POA: Diagnosis not present

## 2016-12-02 DIAGNOSIS — Z794 Long term (current) use of insulin: Secondary | ICD-10-CM | POA: Diagnosis not present

## 2016-12-02 DIAGNOSIS — E039 Hypothyroidism, unspecified: Secondary | ICD-10-CM | POA: Insufficient documentation

## 2016-12-02 DIAGNOSIS — S299XXA Unspecified injury of thorax, initial encounter: Secondary | ICD-10-CM | POA: Diagnosis present

## 2016-12-02 DIAGNOSIS — Y939 Activity, unspecified: Secondary | ICD-10-CM | POA: Insufficient documentation

## 2016-12-02 DIAGNOSIS — I10 Essential (primary) hypertension: Secondary | ICD-10-CM | POA: Insufficient documentation

## 2016-12-02 DIAGNOSIS — Y999 Unspecified external cause status: Secondary | ICD-10-CM | POA: Insufficient documentation

## 2016-12-02 MED ORDER — IBUPROFEN 600 MG PO TABS: 600.0000 mg | ORAL_TABLET | Freq: Four times a day (QID) | ORAL | 0 refills | Status: DC | PRN

## 2016-12-02 MED ORDER — IBUPROFEN 200 MG PO TABS
600.0000 mg | ORAL_TABLET | Freq: Once | ORAL | Status: AC
  Administered 2016-12-02: 600 mg via ORAL
  Filled 2016-12-02: qty 3

## 2016-12-02 NOTE — Discharge Instructions (Signed)
Return if any problems.
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
## 2016-12-13 ENCOUNTER — Other Ambulatory Visit: Payer: Self-pay | Admitting: Nurse Practitioner

## 2016-12-13 DIAGNOSIS — Z1231 Encounter for screening mammogram for malignant neoplasm of breast: Secondary | ICD-10-CM

## 2016-12-14 ENCOUNTER — Ambulatory Visit: Payer: Medicare Other

## 2017-03-19 ENCOUNTER — Other Ambulatory Visit: Payer: Self-pay | Admitting: Nurse Practitioner

## 2017-03-19 DIAGNOSIS — M5126 Other intervertebral disc displacement, lumbar region: Secondary | ICD-10-CM

## 2017-03-29 ENCOUNTER — Ambulatory Visit
Admission: RE | Admit: 2017-03-29 | Discharge: 2017-03-29 | Disposition: A | Discharge status: Home / Self Care | Payer: Medicare Other | Source: Ambulatory Visit | Attending: Nurse Practitioner | Admitting: Nurse Practitioner

## 2017-03-29 DIAGNOSIS — M5126 Other intervertebral disc displacement, lumbar region: Secondary | ICD-10-CM

## 2017-03-29 MED ORDER — IOPAMIDOL (ISOVUE-M 200) INJECTION 41%
1.0000 mL | Freq: Once | INTRAMUSCULAR | Status: AC
  Administered 2017-03-29: 1 mL via EPIDURAL

## 2017-03-29 MED ORDER — METHYLPREDNISOLONE ACETATE 40 MG/ML INJ SUSP (RADIOLOG
120.0000 mg | Freq: Once | INTRAMUSCULAR | Status: AC
  Administered 2017-03-29: 120 mg via EPIDURAL

## 2017-03-29 NOTE — Discharge Instructions (Signed)

## 2017-06-18 ENCOUNTER — Ambulatory Visit: Payer: Medicare Other

## 2017-06-20 ENCOUNTER — Other Ambulatory Visit: Payer: Self-pay | Admitting: Neurosurgery

## 2017-06-20 DIAGNOSIS — M4310 Spondylolisthesis, site unspecified: Secondary | ICD-10-CM

## 2017-06-21 ENCOUNTER — Telehealth: Payer: Self-pay | Admitting: Nurse Practitioner

## 2017-06-21 NOTE — Telephone Encounter (Signed)
Phone call to patient to verify medication list and allergies for myelogram procedure. Pt informed to stop wellbutrin and cymbalta 48hrs prior to appointment time. Pt verbalized understanding.

## 2017-07-02 ENCOUNTER — Other Ambulatory Visit: Payer: Medicare Other

## 2017-07-08 ENCOUNTER — Ambulatory Visit
Admission: RE | Admit: 2017-07-08 | Discharge: 2017-07-08 | Disposition: A | Discharge status: Home / Self Care | Payer: Medicare Other | Source: Ambulatory Visit | Attending: Neurosurgery | Admitting: Neurosurgery

## 2017-07-08 DIAGNOSIS — M4310 Spondylolisthesis, site unspecified: Secondary | ICD-10-CM

## 2017-07-08 MED ORDER — MEPERIDINE HCL 100 MG/ML IJ SOLN
75.0000 mg | Freq: Once | INTRAMUSCULAR | Status: AC
  Administered 2017-07-08: 75 mg via INTRAMUSCULAR

## 2017-07-08 MED ORDER — IOPAMIDOL (ISOVUE-M 200) INJECTION 41%
15.0000 mL | Freq: Once | INTRAMUSCULAR | Status: AC
  Administered 2017-07-08: 15 mL via INTRATHECAL

## 2017-07-08 MED ORDER — DIAZEPAM 5 MG PO TABS
10.0000 mg | ORAL_TABLET | Freq: Once | ORAL | Status: AC
  Administered 2017-07-08: 10 mg via ORAL

## 2017-07-08 MED ORDER — ONDANSETRON HCL 4 MG/2ML IJ SOLN
4.0000 mg | Freq: Once | INTRAMUSCULAR | Status: AC
  Administered 2017-07-08: 4 mg via INTRAMUSCULAR

## 2017-07-08 NOTE — Discharge Instructions (Signed)
Myelogram Discharge Instructions  1. Go home and rest quietly for the next 24 hours.  It is important to lie flat for the next 24 hours.  Get up only to go to the restroom.  You may lie in the bed or on a couch on your back, your stomach, your left side or your right side.  You may have one pillow under your head.  You may have pillows between your knees while you are on your side or under your knees while you are on your back.  2. DO NOT drive today.  Recline the seat as far back as it will go, while still wearing your seat belt, on the way home.  3. You may get up to go to the bathroom as needed.  You may sit up for 10 minutes to eat.  You may resume your normal diet and medications unless otherwise indicated.  Drink lots of extra fluids today and tomorrow.  4. The incidence of headache, nausea, or vomiting is about 5% (one in 20 patients).  If you develop a headache, lie flat and drink plenty of fluids until the headache goes away.  Caffeinated beverages may be helpful.  If you develop severe nausea and vomiting or a headache that does not go away with flat bed rest, call [phone].  5. You may resume normal activities after your 24 hours of bed rest is over; however, do not exert yourself strongly or do any heavy lifting tomorrow. If when you get up you have a headache when standing, go back to bed and force fluids for another 24 hours.  6. Call your physician for a follow-up appointment.  The results of your myelogram will be sent directly to your physician by the following day.  7. If you have any questions or if complications develop after you arrive home, please call [phone].  Discharge instructions have been explained to the patient.  The patient, or the person responsible for the patient, fully understands these instructions.  YOU MAY RESTART YOUR [facility] AND CYMBALTA TOMORROW 07/09/2017 AT 09:30AM.

## 2017-07-25 ENCOUNTER — Other Ambulatory Visit: Payer: Self-pay | Admitting: Neurosurgery

## 2017-08-03 HISTORY — PX: LUMBAR LAMINECTOMY: SHX95

## 2017-08-06 NOTE — Pre-Procedure Instructions (Signed)
KEYRI SALBERG  08/06/2017      [facility], [place] AT [place] [address] [place] [address] Phone: [phone] Fax: [phone]  [place] [address], [place] [address] [place] [address] [place] [address] [place] [address] Phone: [phone] Fax: [phone]  [place], [place] [address] Phone: [phone] Fax: [phone]    Your procedure is scheduled on July 16  Report to [facility] at 0600 A.M.  Call this number if you have problems the morning of surgery:  [phone]   Remember:  Do not eat or drink after midnight.      Take these medicines the morning of surgery with A SIP OF WATER  buPROPion (WELLBUTRIN  clonazePAM (KLONOPIN) cyclobenzaprine (FLEXERIL) DULoxetine (CYMBALTA) HYDROcodone-acetaminophen (NORCO) levothyroxine (SYNTHROID, LEVOTHROID) metoprolol succinate (TOPROL-XL)  7 days prior to surgery STOP taking any Aspirin(unless otherwise instructed by your surgeon), Aleve, Naproxen, Ibuprofen, Motrin, Advil, Goody's, BC's, all herbal medications, fish oil, and all vitamins   WHAT DO I DO ABOUT MY DIABETES MEDICATION?   Marland Kitchen Do not take oral diabetes medicines (pills) the morning of surgery.   . The day of surgery, do not take other diabetes injectables, including Byetta (exenatide), Bydureon (exenatide ER), Victoza (liraglutide), or Trulicity (dulaglutide).  . If your CBG is greater than 220 mg/dL, you may take  of your sliding scale (correction) dose of insulin. insulin regular (NOVOLIN R,HUMULIN R)    How to Manage Your Diabetes Before and After Surgery  Why is it important to control my blood sugar before and after surgery? . Improving blood sugar levels before and after surgery helps healing and can limit problems. . A way of improving blood  sugar control is eating a healthy diet by: o  Eating less sugar and carbohydrates o  Increasing activity/exercise o  Talking with your doctor about reaching your blood sugar goals . High blood sugars (greater than 180 mg/dL) can raise your risk of infections and slow your recovery, so you will need to focus on controlling your diabetes during the weeks before surgery. . Make sure that the doctor who takes care of your diabetes knows about your planned surgery including the date and location.  How do I manage my blood sugar before surgery? . Check your blood sugar at least 4 times a day, starting 2 days before surgery, to make sure that the level is not too high or low. o Check your blood sugar the morning of your surgery when you wake up and every 2 hours until you get to the Short Stay unit. . If your blood sugar is less than 70 mg/dL, you will need to treat for low blood sugar: o Do not take insulin. o Treat a low blood sugar (less than 70 mg/dL) with  cup of clear juice (cranberry or apple), 4 glucose tablets, OR glucose gel. o Recheck blood sugar in 15 minutes after treatment (to make sure it is greater than 70 mg/dL). If your blood sugar is not greater than 70 mg/dL on recheck, call [phone] for further instructions. . Report your blood sugar to the short stay nurse when you get to Short Stay.  . If you are admitted to the hospital after surgery: o Your blood sugar will be checked by the staff and you will probably be given insulin after  surgery (instead of oral diabetes medicines) to make sure you have good blood sugar levels. o The goal for blood sugar control after surgery is 80-180 mg/dL.    Do not wear jewelry, make-up or nail polish.  Do not wear lotions, powders, or perfumes, or deodorant.  Do not shave 48 hours prior to surgery.    Do not bring valuables to the hospital.  [facility] is not responsible for any belongings or valuables.  Contacts, dentures or bridgework may  not be worn into surgery.  Leave your suitcase in the car.  After surgery it may be brought to your room.  For patients admitted to the hospital, discharge time will be determined by your treatment team.  Patients discharged the day of surgery will not be allowed to drive home.    Special instructions:   [facility]- Preparing For Surgery  Before surgery, you can play an important role. Because skin is not sterile, your skin needs to be as free of germs as possible. You can reduce the number of germs on your skin by washing with CHG (chlorahexidine gluconate) Soap before surgery.  CHG is an antiseptic cleaner which kills germs and bonds with the skin to continue killing germs even after washing.    Oral Hygiene is also important to reduce your risk of infection.  Remember - BRUSH YOUR TEETH THE MORNING OF SURGERY WITH YOUR REGULAR TOOTHPASTE  Please do not use if you have an allergy to CHG or antibacterial soaps. If your skin becomes reddened/irritated stop using the CHG.  Do not shave (including legs and underarms) for at least 48 hours prior to first CHG shower. It is OK to shave your face.  Please follow these instructions carefully.   1. Shower the NIGHT BEFORE SURGERY and the MORNING OF SURGERY with CHG.   2. If you chose to wash your hair, wash your hair first as usual with your normal shampoo.  3. After you shampoo, rinse your hair and body thoroughly to remove the shampoo.  4. Use CHG as you would any other liquid soap. You can apply CHG directly to the skin and wash gently with a scrungie or a clean washcloth.   5. Apply the CHG Soap to your body ONLY FROM THE NECK DOWN.  Do not use on open wounds or open sores. Avoid contact with your eyes, ears, mouth and genitals (private parts). Wash Face and genitals (private parts)  with your normal soap.  6. Wash thoroughly, paying special attention to the area where your surgery will be performed.  7. Thoroughly rinse your body with  warm water from the neck down.  8. DO NOT shower/wash with your normal soap after using and rinsing off the CHG Soap.  9. Pat yourself dry with a CLEAN TOWEL.  10. Wear CLEAN PAJAMAS to bed the night before surgery, wear comfortable clothes the morning of surgery  11. Place CLEAN SHEETS on your bed the night of your first shower and DO NOT SLEEP WITH PETS.    Day of Surgery:  Do not apply any deodorants/lotions.  Please wear clean clothes to the hospital/surgery center.   Remember to brush your teeth WITH YOUR REGULAR TOOTHPASTE.    Please read over the following fact sheets that you were given.

## 2017-08-07 ENCOUNTER — Other Ambulatory Visit: Payer: Self-pay

## 2017-08-07 ENCOUNTER — Encounter (HOSPITAL_COMMUNITY)
Admission: RE | Admit: 2017-08-07 | Discharge: 2017-08-07 | Disposition: A | Discharge status: Home / Self Care | Payer: Medicare Other | Source: Ambulatory Visit | Attending: Neurosurgery | Admitting: Neurosurgery

## 2017-08-07 ENCOUNTER — Encounter (HOSPITAL_COMMUNITY): Payer: Self-pay

## 2017-08-07 DIAGNOSIS — E119 Type 2 diabetes mellitus without complications: Secondary | ICD-10-CM | POA: Insufficient documentation

## 2017-08-07 DIAGNOSIS — Z01810 Encounter for preprocedural cardiovascular examination: Secondary | ICD-10-CM | POA: Diagnosis not present

## 2017-08-07 DIAGNOSIS — Z01812 Encounter for preprocedural laboratory examination: Secondary | ICD-10-CM | POA: Insufficient documentation

## 2017-08-07 LAB — CBC WITH DIFFERENTIAL/PLATELET
Abs Immature Granulocytes: 0 10*3/uL (ref 0.0–0.1)
Basophils Absolute: 0.1 10*3/uL (ref 0.0–0.1)
Basophils Relative: 1 %
Eosinophils Absolute: 0.6 10*3/uL (ref 0.0–0.7)
Eosinophils Relative: 6 %
HCT: 37.4 % (ref 36.0–46.0)
Hemoglobin: 10.7 g/dL — ABNORMAL LOW (ref 12.0–15.0)
Immature Granulocytes: 0 %
Lymphocytes Relative: 36 %
Lymphs Abs: 3.6 10*3/uL (ref 0.7–4.0)
MCH: 23.9 pg — ABNORMAL LOW (ref 26.0–34.0)
MCHC: 28.6 g/dL — ABNORMAL LOW (ref 30.0–36.0)
MCV: 83.7 fL (ref 78.0–100.0)
Monocytes Absolute: 0.6 10*3/uL (ref 0.1–1.0)
Monocytes Relative: 6 %
Neutro Abs: 5 10*3/uL (ref 1.7–7.7)
Neutrophils Relative %: 51 %
Platelets: 333 10*3/uL (ref 150–400)
RBC: 4.47 MIL/uL (ref 3.87–5.11)
RDW: 15.9 % — ABNORMAL HIGH (ref 11.5–15.5)
WBC: 9.9 10*3/uL (ref 4.0–10.5)

## 2017-08-07 LAB — GLUCOSE, CAPILLARY: Glucose-Capillary: 306 mg/dL — ABNORMAL HIGH (ref 70–99)

## 2017-08-07 LAB — SURGICAL PCR SCREEN
MRSA, PCR: POSITIVE — AB
Staphylococcus aureus: POSITIVE — AB

## 2017-08-07 LAB — TYPE AND SCREEN
ABO/RH(D): B POS
Antibody Screen: NEGATIVE

## 2017-08-07 LAB — ABO/RH: ABO/RH(D): B POS

## 2017-08-07 LAB — HEMOGLOBIN A1C
Hgb A1c MFr Bld: 12.5 % — ABNORMAL HIGH (ref 4.8–5.6)
Mean Plasma Glucose: 312.05 mg/dL

## 2017-08-07 NOTE — Progress Notes (Signed)
PCP - Eldridge Abrahams Cardiologist - denies  Chest x-ray - not needed EKG - 08/07/17 Stress Test - denies ECHO - requesting Cardiac Cath - denies   Fasting Blood Sugar - under 100s Checks Blood Sugar __3___ times a day  Anesthesia review: echo requested yes  Patient denies shortness of breath, fever, cough and chest pain at PAT appointment   Patient verbalized understanding of instructions that were given to them at the PAT appointment. Patient was also instructed that they will need to review over the PAT instructions again at home before surgery.

## 2017-08-08 NOTE — Progress Notes (Signed)
Anesthesia Chart Review:   Case:  [id number] Date/Time:  08/13/17 0745   Procedure:  PLIF - L4-L5 (N/A Back)   Anesthesia type:  General   Pre-op diagnosis:  Spondylolisthesis   Location:  [facility] OR ROOM 77 / [facility]   Surgeon:  Earnie Larsson, MD      DISCUSSION: - Pt is a 47 year old female with hx uncontrolled DM, HTN.    - HbA1c 12.5; glucose 306 at pre-admission testing  - I reached out to PCP for input on pt proceeding with surgery given uncontrolled DM, however PCP is out of the office and won't be back prior to surgery.    - I notified Lorriane Shire in Dr. Marchelle Folks office of uncontrolled DM.    VS: BP 108/65   Pulse 100   Temp 36.9 C   Resp 20   Ht 5' 7"  (1.702 m)   Wt (!) 314 lb 14.4 oz (142.8 kg)   SpO2 98%   BMI 49.32 kg/m    PROVIDERS: PCP is Berkley Harvey, NP   LABS:  - HbA1c 12.5 (up from 10.3 three months ago) - BMP erroneously not obtained at pre-admission testing.  Will get day of surgery  (all labs ordered are listed, but only abnormal results are displayed)  Labs Reviewed  SURGICAL PCR SCREEN - Abnormal; Notable for the following components:      Result Value   MRSA, PCR POSITIVE (*)    Staphylococcus aureus POSITIVE (*)    All other components within normal limits  GLUCOSE, CAPILLARY - Abnormal; Notable for the following components:   Glucose-Capillary 306 (*)    All other components within normal limits  CBC WITH DIFFERENTIAL/PLATELET - Abnormal; Notable for the following components:   Hemoglobin 10.7 (*)    MCH 23.9 (*)    MCHC 28.6 (*)    RDW 15.9 (*)    All other components within normal limits  HEMOGLOBIN A1C - Abnormal; Notable for the following components:   Hgb A1c MFr Bld 12.5 (*)    All other components within normal limits  TYPE AND SCREEN  ABO/RH     IMAGES:  CXR 12/02/16: No active cardiopulmonary disease.   EKG 08/07/17: NSR   CV:  Echo 03/21/16: requested   Past Medical History:  Diagnosis Date  . Depression   . Diabetes  mellitus   . Enlarged heart    denies  . Hypertension   . Migraine   . Thyroid disease     Past Surgical History:  Procedure Laterality Date  . ABDOMINAL HYSTERECTOMY    . GASTRIC BYPASS    . HERNIA REPAIR     umbilical  . KNEE SURGERY      MEDICATIONS: . ACCU-CHEK SOFTCLIX LANCETS lancets  . amitriptyline (ELAVIL) 50 MG tablet  . atorvastatin (LIPITOR) 10 MG tablet  . Blood Glucose Monitoring Suppl (RA BLOOD GLUCOSE MONITOR) DEVI  . buPROPion (WELLBUTRIN XL) 300 MG 24 hr tablet  . clonazePAM (KLONOPIN) 1 MG tablet  . clotrimazole (LOTRIMIN) 1 % cream  . cyclobenzaprine (FLEXERIL) 10 MG tablet  . DULoxetine (CYMBALTA) 60 MG capsule  . furosemide (LASIX) 20 MG tablet  . HYDROcodone-acetaminophen (NORCO) 10-325 MG per tablet  . ibuprofen (ADVIL,MOTRIN) 600 MG tablet  . insulin regular (NOVOLIN R,HUMULIN R) 100 units/mL injection  . Insulin Syringe-Needle U-100 (INSULIN SYRINGE 1CC/31GX5/16") 31G X 5/16" 1 ML MISC  . Krill Oil (OMEGA-3) 500 MG CAPS  . levothyroxine (SYNTHROID, LEVOTHROID) 150 MCG tablet  . lisinopril (  PRINIVIL,ZESTRIL) 5 MG tablet  . metoprolol succinate (TOPROL-XL) 25 MG 24 hr tablet  . ondansetron (ZOFRAN) 4 MG tablet   . methylPREDNISolone acetate (DEPO-MEDROL) injection 40 mg    If labs acceptable day of surgery, I anticipate pt can proceed with surgery as scheduled.  Willeen Cass, FNP-BC [facility] Short Stay Surgical Center/Anesthesiology Phone: [phone] 08/12/2017 12:52 PM

## 2017-08-12 MED ORDER — DEXTROSE 5 % IV SOLN
3.0000 g | INTRAVENOUS | Status: AC
  Administered 2017-08-13: 3 g via INTRAVENOUS
  Filled 2017-08-12: qty 3

## 2017-08-12 MED ORDER — DEXAMETHASONE SODIUM PHOSPHATE 10 MG/ML IJ SOLN
10.0000 mg | INTRAMUSCULAR | Status: AC
  Administered 2017-08-13: 4 mg via INTRAVENOUS
  Filled 2017-08-12: qty 1

## 2017-08-12 MED ORDER — VANCOMYCIN HCL 10 G IV SOLR
1500.0000 mg | INTRAVENOUS | Status: AC
  Administered 2017-08-13: 1500 mg via INTRAVENOUS
  Filled 2017-08-12: qty 1500

## 2017-08-13 ENCOUNTER — Inpatient Hospital Stay (HOSPITAL_COMMUNITY)
Admission: RE | Disposition: A | Discharge status: Home / Self Care | Payer: Self-pay | Source: Home / Self Care | Attending: Neurosurgery

## 2017-08-13 ENCOUNTER — Inpatient Hospital Stay (HOSPITAL_COMMUNITY)
Admission: RE | Admit: 2017-08-13 | Discharge: 2017-08-14 | DRG: 454 | Disposition: A | Discharge status: Home / Self Care | Payer: Medicare Other | Attending: Neurosurgery | Admitting: Neurosurgery

## 2017-08-13 ENCOUNTER — Inpatient Hospital Stay (HOSPITAL_COMMUNITY): Payer: Medicare Other

## 2017-08-13 ENCOUNTER — Inpatient Hospital Stay (HOSPITAL_COMMUNITY): Payer: Medicare Other | Admitting: Emergency Medicine

## 2017-08-13 ENCOUNTER — Other Ambulatory Visit: Payer: Self-pay

## 2017-08-13 ENCOUNTER — Encounter (HOSPITAL_COMMUNITY): Payer: Self-pay

## 2017-08-13 DIAGNOSIS — E079 Disorder of thyroid, unspecified: Secondary | ICD-10-CM | POA: Diagnosis present

## 2017-08-13 DIAGNOSIS — I119 Hypertensive heart disease without heart failure: Secondary | ICD-10-CM | POA: Diagnosis present

## 2017-08-13 DIAGNOSIS — Z794 Long term (current) use of insulin: Secondary | ICD-10-CM

## 2017-08-13 DIAGNOSIS — M4310 Spondylolisthesis, site unspecified: Secondary | ICD-10-CM | POA: Diagnosis present

## 2017-08-13 DIAGNOSIS — Z6841 Body Mass Index (BMI) 40.0 and over, adult: Secondary | ICD-10-CM | POA: Diagnosis not present

## 2017-08-13 DIAGNOSIS — M4316 Spondylolisthesis, lumbar region: Principal | ICD-10-CM | POA: Diagnosis present

## 2017-08-13 DIAGNOSIS — Z886 Allergy status to analgesic agent status: Secondary | ICD-10-CM

## 2017-08-13 DIAGNOSIS — M48062 Spinal stenosis, lumbar region with neurogenic claudication: Secondary | ICD-10-CM | POA: Diagnosis present

## 2017-08-13 DIAGNOSIS — Z91048 Other nonmedicinal substance allergy status: Secondary | ICD-10-CM

## 2017-08-13 DIAGNOSIS — E119 Type 2 diabetes mellitus without complications: Secondary | ICD-10-CM | POA: Diagnosis present

## 2017-08-13 DIAGNOSIS — Z885 Allergy status to narcotic agent status: Secondary | ICD-10-CM

## 2017-08-13 DIAGNOSIS — M5116 Intervertebral disc disorders with radiculopathy, lumbar region: Secondary | ICD-10-CM | POA: Diagnosis present

## 2017-08-13 DIAGNOSIS — F329 Major depressive disorder, single episode, unspecified: Secondary | ICD-10-CM | POA: Diagnosis present

## 2017-08-13 DIAGNOSIS — M549 Dorsalgia, unspecified: Secondary | ICD-10-CM | POA: Diagnosis present

## 2017-08-13 DIAGNOSIS — Z79899 Other long term (current) drug therapy: Secondary | ICD-10-CM

## 2017-08-13 DIAGNOSIS — Z419 Encounter for procedure for purposes other than remedying health state, unspecified: Secondary | ICD-10-CM

## 2017-08-13 DIAGNOSIS — Z79890 Hormone replacement therapy (postmenopausal): Secondary | ICD-10-CM

## 2017-08-13 LAB — GLUCOSE, CAPILLARY
Glucose-Capillary: 123 mg/dL — ABNORMAL HIGH (ref 70–99)
Glucose-Capillary: 216 mg/dL — ABNORMAL HIGH (ref 70–99)
Glucose-Capillary: 301 mg/dL — ABNORMAL HIGH (ref 70–99)
Glucose-Capillary: 329 mg/dL — ABNORMAL HIGH (ref 70–99)
Glucose-Capillary: 329 mg/dL — ABNORMAL HIGH (ref 70–99)
Glucose-Capillary: 433 mg/dL — ABNORMAL HIGH (ref 70–99)
Glucose-Capillary: 458 mg/dL — ABNORMAL HIGH (ref 70–99)

## 2017-08-13 LAB — BASIC METABOLIC PANEL
Anion gap: 8 (ref 5–15)
BUN: 16 mg/dL (ref 6–20)
Calcium: 9.3 mg/dL (ref 8.9–10.3)
Creatinine, Ser: 0.95 mg/dL (ref 0.44–1.00)
GFR calc non Af Amer: >60 mL/min (ref >60)
Potassium: 4.3 mmol/L (ref 3.5–5.1)
Sodium: 136 mmol/L (ref 135–145)

## 2017-08-13 LAB — BASIC METABOLIC PANEL WITH GFR
CO2: 25 mmol/L (ref 22–32)
Chloride: 103 mmol/L (ref 98–111)
GFR calc Af Amer: >60 mL/min (ref >60)
Glucose, Bld: 123 mg/dL — ABNORMAL HIGH (ref 70–99)

## 2017-08-13 SURGERY — POSTERIOR LUMBAR FUSION 1 LEVEL
Anesthesia: General | Site: Back

## 2017-08-13 MED ORDER — INSULIN ASPART 100 UNIT/ML ~~LOC~~ SOLN: 30.0000 [IU] | Freq: Three times a day (TID) | SUBCUTANEOUS | Status: DC

## 2017-08-13 MED ORDER — OXYCODONE HCL 5 MG PO TABS
10.0000 mg | ORAL_TABLET | ORAL | Status: DC | PRN
  Administered 2017-08-13 – 2017-08-14 (×4): 10 mg via ORAL
  Filled 2017-08-13 (×4): qty 2

## 2017-08-13 MED ORDER — CHLORHEXIDINE GLUCONATE CLOTH 2 % EX PADS: 6.0000 | MEDICATED_PAD | Freq: Once | CUTANEOUS | Status: DC

## 2017-08-13 MED ORDER — ACETAMINOPHEN 10 MG/ML IV SOLN
INTRAVENOUS | Status: AC
  Filled 2017-08-13: qty 100

## 2017-08-13 MED ORDER — DULOXETINE HCL 30 MG PO CPEP
60.0000 mg | ORAL_CAPSULE | Freq: Two times a day (BID) | ORAL | Status: DC
  Administered 2017-08-13 – 2017-08-14 (×2): 60 mg via ORAL
  Filled 2017-08-13 (×2): qty 2

## 2017-08-13 MED ORDER — FLEET ENEMA 7-19 GM/118ML RE ENEM: 1.0000 | ENEMA | Freq: Once | RECTAL | Status: DC | PRN

## 2017-08-13 MED ORDER — ATORVASTATIN CALCIUM 20 MG PO TABS
10.0000 mg | ORAL_TABLET | Freq: Every day | ORAL | Status: DC
  Administered 2017-08-13: 10 mg via ORAL
  Filled 2017-08-13: qty 1

## 2017-08-13 MED ORDER — ACETAMINOPHEN 10 MG/ML IV SOLN
INTRAVENOUS | Status: DC | PRN
  Administered 2017-08-13: 1000 mg via INTRAVENOUS

## 2017-08-13 MED ORDER — PROPOFOL 10 MG/ML IV BOLUS
INTRAVENOUS | Status: AC
  Filled 2017-08-13: qty 20

## 2017-08-13 MED ORDER — BUPIVACAINE HCL (PF) 0.25 % IJ SOLN
INTRAMUSCULAR | Status: DC | PRN
  Administered 2017-08-13: 20 mL

## 2017-08-13 MED ORDER — ACETAMINOPHEN 325 MG PO TABS
650.0000 mg | ORAL_TABLET | ORAL | Status: DC | PRN
  Administered 2017-08-14: 650 mg via ORAL
  Filled 2017-08-13: qty 2

## 2017-08-13 MED ORDER — DEXMEDETOMIDINE HCL 200 MCG/2ML IV SOLN
INTRAVENOUS | Status: DC | PRN
  Administered 2017-08-13: 8 ug via INTRAVENOUS

## 2017-08-13 MED ORDER — DIAZEPAM 5 MG PO TABS
ORAL_TABLET | ORAL | Status: AC
  Filled 2017-08-13: qty 2

## 2017-08-13 MED ORDER — SODIUM CHLORIDE 0.9 % IV SOLN
250.0000 mL | INTRAVENOUS | Status: DC
  Administered 2017-08-13: 250 mL via INTRAVENOUS

## 2017-08-13 MED ORDER — VANCOMYCIN HCL 1 G IV SOLR
INTRAVENOUS | Status: DC | PRN
  Administered 2017-08-13: 1000 mg

## 2017-08-13 MED ORDER — KETAMINE HCL 50 MG/5ML IJ SOSY
PREFILLED_SYRINGE | INTRAMUSCULAR | Status: AC
  Filled 2017-08-13: qty 5

## 2017-08-13 MED ORDER — FENTANYL CITRATE (PF) 250 MCG/5ML IJ SOLN
INTRAMUSCULAR | Status: AC
  Filled 2017-08-13: qty 5

## 2017-08-13 MED ORDER — EPHEDRINE SULFATE 50 MG/ML IJ SOLN
INTRAMUSCULAR | Status: DC | PRN
  Administered 2017-08-13 (×2): 5 mg via INTRAVENOUS

## 2017-08-13 MED ORDER — INSULIN ASPART 100 UNIT/ML ~~LOC~~ SOLN
SUBCUTANEOUS | Status: AC
Start: 2017-08-13 — End: 2017-08-14
  Filled 2017-08-13: qty 1

## 2017-08-13 MED ORDER — PHENYLEPHRINE HCL 10 MG/ML IJ SOLN
INTRAMUSCULAR | Status: DC | PRN
  Administered 2017-08-13: 50 ug/min via INTRAVENOUS

## 2017-08-13 MED ORDER — METOPROLOL SUCCINATE ER 25 MG PO TB24
25.0000 mg | ORAL_TABLET | Freq: Every day | ORAL | Status: DC
Start: 2017-08-14 — End: 2017-08-14
  Administered 2017-08-14: 25 mg via ORAL
  Filled 2017-08-13: qty 1

## 2017-08-13 MED ORDER — SODIUM CHLORIDE 0.9% FLUSH: 3.0000 mL | INTRAVENOUS | Status: DC | PRN

## 2017-08-13 MED ORDER — FENTANYL CITRATE (PF) 100 MCG/2ML IJ SOLN
INTRAMUSCULAR | Status: AC
  Filled 2017-08-13: qty 2

## 2017-08-13 MED ORDER — FENTANYL CITRATE (PF) 100 MCG/2ML IJ SOLN
INTRAMUSCULAR | Status: DC | PRN
  Administered 2017-08-13 (×3): 50 ug via INTRAVENOUS
  Administered 2017-08-13: 100 ug via INTRAVENOUS

## 2017-08-13 MED ORDER — CEFAZOLIN SODIUM-DEXTROSE 1-4 GM/50ML-% IV SOLN
1.0000 g | Freq: Three times a day (TID) | INTRAVENOUS | Status: AC
  Administered 2017-08-13 (×2): 1 g via INTRAVENOUS
  Filled 2017-08-13 (×2): qty 50

## 2017-08-13 MED ORDER — INSULIN REGULAR HUMAN 100 UNIT/ML IJ SOLN
0.0000 [IU] | Freq: Three times a day (TID) | INTRAMUSCULAR | Status: DC
  Administered 2017-08-14: 7 [IU] via SUBCUTANEOUS
  Filled 2017-08-13 (×25): qty 10

## 2017-08-13 MED ORDER — OMEGA-3 500 MG PO CAPS: 500.0000 mg | ORAL_CAPSULE | Freq: Every day | ORAL | Status: DC

## 2017-08-13 MED ORDER — INSULIN ASPART 100 UNIT/ML ~~LOC~~ SOLN
10.0000 [IU] | Freq: Once | SUBCUTANEOUS | Status: AC
  Administered 2017-08-13: 10 [IU] via SUBCUTANEOUS

## 2017-08-13 MED ORDER — 0.9 % SODIUM CHLORIDE (POUR BTL) OPTIME
TOPICAL | Status: DC | PRN
  Administered 2017-08-13: 1000 mL

## 2017-08-13 MED ORDER — LIDOCAINE HCL (CARDIAC) PF 100 MG/5ML IV SOSY
PREFILLED_SYRINGE | INTRAVENOUS | Status: DC | PRN
  Administered 2017-08-13: 60 mg via INTRAVENOUS

## 2017-08-13 MED ORDER — MIDAZOLAM HCL 2 MG/2ML IJ SOLN
INTRAMUSCULAR | Status: AC
  Filled 2017-08-13: qty 2

## 2017-08-13 MED ORDER — SODIUM CHLORIDE 0.9 % IV SOLN
INTRAVENOUS | Status: DC | PRN
  Administered 2017-08-13: 500 mL

## 2017-08-13 MED ORDER — HYDROMORPHONE HCL 1 MG/ML IJ SOLN
1.0000 mg | INTRAMUSCULAR | Status: DC | PRN
  Administered 2017-08-13 (×2): 1 mg via INTRAVENOUS
  Filled 2017-08-13 (×2): qty 1

## 2017-08-13 MED ORDER — BISACODYL 10 MG RE SUPP: 10.0000 mg | Freq: Every day | RECTAL | Status: DC | PRN

## 2017-08-13 MED ORDER — SODIUM CHLORIDE 0.9% FLUSH: 3.0000 mL | Freq: Two times a day (BID) | INTRAVENOUS | Status: DC

## 2017-08-13 MED ORDER — VANCOMYCIN HCL 1000 MG IV SOLR
INTRAVENOUS | Status: AC
  Filled 2017-08-13: qty 1000

## 2017-08-13 MED ORDER — MIDAZOLAM HCL 5 MG/5ML IJ SOLN
INTRAMUSCULAR | Status: DC | PRN
  Administered 2017-08-13: 2 mg via INTRAVENOUS

## 2017-08-13 MED ORDER — ARTIFICIAL TEARS OPHTHALMIC OINT
TOPICAL_OINTMENT | OPHTHALMIC | Status: AC
  Filled 2017-08-13: qty 3.5

## 2017-08-13 MED ORDER — VANCOMYCIN HCL 1000 MG IV SOLR
INTRAVENOUS | Status: DC | PRN
  Administered 2017-08-13: 1500 mg via INTRAVENOUS

## 2017-08-13 MED ORDER — SUGAMMADEX SODIUM 500 MG/5ML IV SOLN
INTRAVENOUS | Status: DC | PRN
  Administered 2017-08-13: 300 mg via INTRAVENOUS

## 2017-08-13 MED ORDER — LACTATED RINGERS IV SOLN: INTRAVENOUS | Status: DC

## 2017-08-13 MED ORDER — PHENYLEPHRINE HCL 10 MG/ML IJ SOLN
INTRAMUSCULAR | Status: DC | PRN
  Administered 2017-08-13: 120 ug via INTRAVENOUS
  Administered 2017-08-13 (×2): 80 ug via INTRAVENOUS

## 2017-08-13 MED ORDER — CYCLOBENZAPRINE HCL 10 MG PO TABS
10.0000 mg | ORAL_TABLET | Freq: Two times a day (BID) | ORAL | Status: DC | PRN
Start: 2017-08-13 — End: 2017-08-14
  Administered 2017-08-13 – 2017-08-14 (×2): 10 mg via ORAL
  Filled 2017-08-13 (×2): qty 1

## 2017-08-13 MED ORDER — KETAMINE HCL 10 MG/ML IJ SOLN
INTRAMUSCULAR | Status: DC | PRN
  Administered 2017-08-13: 20 mg via INTRAVENOUS

## 2017-08-13 MED ORDER — INSULIN ASPART 100 UNIT/ML ~~LOC~~ SOLN: 0.0000 [IU] | Freq: Three times a day (TID) | SUBCUTANEOUS | Status: DC

## 2017-08-13 MED ORDER — INSULIN REGULAR HUMAN 100 UNIT/ML IJ SOLN
0.0000 [IU] | Freq: Every day | INTRAMUSCULAR | Status: DC
  Filled 2017-08-13 (×8): qty 0.05

## 2017-08-13 MED ORDER — ONDANSETRON HCL 4 MG PO TABS: 4.0000 mg | ORAL_TABLET | Freq: Four times a day (QID) | ORAL | Status: DC | PRN

## 2017-08-13 MED ORDER — DIAZEPAM 5 MG PO TABS
5.0000 mg | ORAL_TABLET | Freq: Four times a day (QID) | ORAL | Status: DC | PRN
  Administered 2017-08-13: 10 mg via ORAL
  Administered 2017-08-13: 5 mg via ORAL
  Filled 2017-08-13: qty 1

## 2017-08-13 MED ORDER — PROPOFOL 10 MG/ML IV BOLUS
INTRAVENOUS | Status: DC | PRN
  Administered 2017-08-13: 200 mg via INTRAVENOUS

## 2017-08-13 MED ORDER — BUPROPION HCL ER (XL) 300 MG PO TB24
300.0000 mg | ORAL_TABLET | Freq: Every day | ORAL | Status: DC
  Administered 2017-08-14: 300 mg via ORAL
  Filled 2017-08-13: qty 1

## 2017-08-13 MED ORDER — CLONAZEPAM 1 MG PO TABS
1.0000 mg | ORAL_TABLET | Freq: Every day | ORAL | Status: DC | PRN
  Administered 2017-08-13: 2 mg via ORAL
  Filled 2017-08-13 (×2): qty 2

## 2017-08-13 MED ORDER — LIDOCAINE 2% (20 MG/ML) 5 ML SYRINGE
INTRAMUSCULAR | Status: AC
  Filled 2017-08-13: qty 5

## 2017-08-13 MED ORDER — SUCCINYLCHOLINE 20MG/ML (10ML) SYRINGE FOR MEDFUSION PUMP - OPTIME
INTRAMUSCULAR | Status: DC | PRN
  Administered 2017-08-13: 140 mg via INTRAVENOUS

## 2017-08-13 MED ORDER — ARTIFICIAL TEARS OPHTHALMIC OINT
TOPICAL_OINTMENT | OPHTHALMIC | Status: DC | PRN
  Administered 2017-08-13: 1 via OPHTHALMIC

## 2017-08-13 MED ORDER — FUROSEMIDE 20 MG PO TABS
20.0000 mg | ORAL_TABLET | Freq: Two times a day (BID) | ORAL | Status: DC
  Administered 2017-08-13 – 2017-08-14 (×2): 20 mg via ORAL
  Filled 2017-08-13 (×2): qty 1

## 2017-08-13 MED ORDER — INSULIN NPH (HUMAN) (ISOPHANE) 100 UNIT/ML ~~LOC~~ SUSP
30.0000 [IU] | Freq: Two times a day (BID) | SUBCUTANEOUS | Status: DC
  Administered 2017-08-13 – 2017-08-14 (×2): 30 [IU] via SUBCUTANEOUS
  Filled 2017-08-13: qty 10

## 2017-08-13 MED ORDER — MENTHOL 3 MG MT LOZG: 1.0000 | LOZENGE | OROMUCOSAL | Status: DC | PRN

## 2017-08-13 MED ORDER — MEPERIDINE HCL 50 MG/ML IJ SOLN: 6.2500 mg | INTRAMUSCULAR | Status: DC | PRN

## 2017-08-13 MED ORDER — PHENOL 1.4 % MT LIQD: 1.0000 | OROMUCOSAL | Status: DC | PRN

## 2017-08-13 MED ORDER — ROCURONIUM BROMIDE 100 MG/10ML IV SOLN
INTRAVENOUS | Status: DC | PRN
  Administered 2017-08-13: 20 mg via INTRAVENOUS
  Administered 2017-08-13: 40 mg via INTRAVENOUS
  Administered 2017-08-13: 10 mg via INTRAVENOUS
  Administered 2017-08-13: 30 mg via INTRAVENOUS
  Administered 2017-08-13: 10 mg via INTRAVENOUS

## 2017-08-13 MED ORDER — HYDROCODONE-ACETAMINOPHEN 10-325 MG PO TABS
ORAL_TABLET | ORAL | Status: AC
  Filled 2017-08-13: qty 1

## 2017-08-13 MED ORDER — THROMBIN 20000 UNITS EX SOLR
CUTANEOUS | Status: DC | PRN
  Administered 2017-08-13: 20 mL via TOPICAL

## 2017-08-13 MED ORDER — LISINOPRIL 10 MG PO TABS
5.0000 mg | ORAL_TABLET | Freq: Every day | ORAL | Status: DC
  Filled 2017-08-13: qty 1

## 2017-08-13 MED ORDER — ROCURONIUM BROMIDE 10 MG/ML (PF) SYRINGE
PREFILLED_SYRINGE | INTRAVENOUS | Status: AC
Start: 2017-08-13 — End: ?
  Filled 2017-08-13: qty 10

## 2017-08-13 MED ORDER — LEVOTHYROXINE SODIUM 75 MCG PO TABS
150.0000 ug | ORAL_TABLET | Freq: Every day | ORAL | Status: DC
  Administered 2017-08-14: 150 ug via ORAL
  Filled 2017-08-13: qty 2

## 2017-08-13 MED ORDER — HYDROCODONE-ACETAMINOPHEN 10-325 MG PO TABS
1.0000 | ORAL_TABLET | ORAL | Status: DC | PRN
  Administered 2017-08-13 (×2): 1 via ORAL
  Filled 2017-08-13: qty 1

## 2017-08-13 MED ORDER — LACTATED RINGERS IV SOLN
INTRAVENOUS | Status: DC | PRN
  Administered 2017-08-13 (×2): via INTRAVENOUS

## 2017-08-13 MED ORDER — INSULIN ASPART 100 UNIT/ML ~~LOC~~ SOLN
SUBCUTANEOUS | Status: AC
  Filled 2017-08-13: qty 1

## 2017-08-13 MED ORDER — ONDANSETRON HCL 4 MG/2ML IJ SOLN
INTRAMUSCULAR | Status: DC | PRN
  Administered 2017-08-13: 4 mg via INTRAVENOUS

## 2017-08-13 MED ORDER — INSULIN ASPART 100 UNIT/ML ~~LOC~~ SOLN
0.0000 [IU] | Freq: Every day | SUBCUTANEOUS | Status: DC
  Administered 2017-08-13: 5 [IU] via SUBCUTANEOUS

## 2017-08-13 MED ORDER — SUCCINYLCHOLINE CHLORIDE 200 MG/10ML IV SOSY
PREFILLED_SYRINGE | INTRAVENOUS | Status: AC
  Filled 2017-08-13: qty 10

## 2017-08-13 MED ORDER — AMITRIPTYLINE HCL 50 MG PO TABS
50.0000 mg | ORAL_TABLET | Freq: Every day | ORAL | Status: DC
  Administered 2017-08-13: 50 mg via ORAL
  Filled 2017-08-13: qty 1

## 2017-08-13 MED ORDER — METOCLOPRAMIDE HCL 5 MG/ML IJ SOLN: 10.0000 mg | Freq: Once | INTRAMUSCULAR | Status: DC | PRN

## 2017-08-13 MED ORDER — FENTANYL CITRATE (PF) 100 MCG/2ML IJ SOLN
25.0000 ug | INTRAMUSCULAR | Status: DC | PRN
  Administered 2017-08-13: 25 ug via INTRAVENOUS
  Administered 2017-08-13: 50 ug via INTRAVENOUS

## 2017-08-13 MED ORDER — INSULIN REGULAR HUMAN 100 UNIT/ML IJ SOLN
10.0000 [IU] | Freq: Once | INTRAMUSCULAR | Status: AC
  Administered 2017-08-13: 10 [IU] via SUBCUTANEOUS
  Filled 2017-08-13: qty 0.1

## 2017-08-13 MED ORDER — ACETAMINOPHEN 650 MG RE SUPP: 650.0000 mg | RECTAL | Status: DC | PRN

## 2017-08-13 MED ORDER — ONDANSETRON HCL 4 MG/2ML IJ SOLN: 4.0000 mg | Freq: Four times a day (QID) | INTRAMUSCULAR | Status: DC | PRN

## 2017-08-13 MED ORDER — INSULIN ASPART 100 UNIT/ML ~~LOC~~ SOLN
5.0000 [IU] | Freq: Once | SUBCUTANEOUS | Status: AC
  Administered 2017-08-13: 5 [IU] via SUBCUTANEOUS

## 2017-08-13 MED ORDER — INSULIN ASPART 100 UNIT/ML ~~LOC~~ SOLN
0.0000 [IU] | Freq: Three times a day (TID) | SUBCUTANEOUS | Status: DC
  Administered 2017-08-13: 15 [IU] via SUBCUTANEOUS

## 2017-08-13 MED ORDER — BUPIVACAINE HCL (PF) 0.25 % IJ SOLN
INTRAMUSCULAR | Status: AC
  Filled 2017-08-13: qty 30

## 2017-08-13 MED ORDER — CLOTRIMAZOLE 1 % EX CREA
1.0000 "application " | TOPICAL_CREAM | Freq: Every day | CUTANEOUS | Status: DC
  Administered 2017-08-14: 1 via TOPICAL
  Filled 2017-08-13: qty 15

## 2017-08-13 MED ORDER — POLYETHYLENE GLYCOL 3350 17 G PO PACK: 17.0000 g | PACK | Freq: Every day | ORAL | Status: DC | PRN

## 2017-08-13 MED ORDER — THROMBIN (RECOMBINANT) 20000 UNITS EX SOLR
CUTANEOUS | Status: AC
  Filled 2017-08-13: qty 20000

## 2017-08-13 MED ORDER — INSULIN GLARGINE 100 UNIT/ML ~~LOC~~ SOLN
10.0000 [IU] | Freq: Every day | SUBCUTANEOUS | Status: DC
  Administered 2017-08-13: 10 [IU] via SUBCUTANEOUS
  Filled 2017-08-13: qty 0.1

## 2017-08-13 SURGICAL SUPPLY — 66 items
BAG DECANTER FOR FLEXI CONT (MISCELLANEOUS) ×2 IMPLANT
BENZOIN TINCTURE PRP APPL 2/3 (GAUZE/BANDAGES/DRESSINGS) ×2 IMPLANT
BLADE CLIPPER SURG (BLADE) IMPLANT
BUR CUTTER 7.0 ROUND (BURR) IMPLANT
BUR MATCHSTICK NEURO 3.0 LAGG (BURR) ×2 IMPLANT
CANISTER SUCT 3000ML PPV (MISCELLANEOUS) ×2 IMPLANT
CAP LCK SPNE (Orthopedic Implant) ×4 IMPLANT
CAP LOCK SPINE RADIUS (Orthopedic Implant) ×4 IMPLANT
CAP LOCKING (Orthopedic Implant) ×4 IMPLANT
CARTRIDGE OIL MAESTRO DRILL (MISCELLANEOUS) ×1 IMPLANT
CLSR STERI-STRIP ANTIMIC 1/2X4 (GAUZE/BANDAGES/DRESSINGS) ×2 IMPLANT
CONT SPEC 4OZ CLIKSEAL STRL BL (MISCELLANEOUS) ×2 IMPLANT
COVER BACK TABLE 60X90IN (DRAPES) ×2 IMPLANT
DECANTER SPIKE VIAL GLASS SM (MISCELLANEOUS) ×2 IMPLANT
DERMABOND ADVANCED (GAUZE/BANDAGES/DRESSINGS) ×1
DERMABOND ADVANCED .7 DNX12 (GAUZE/BANDAGES/DRESSINGS) ×1 IMPLANT
DEVICE INTERBODY ELEVATE 10X23 (Cage) ×4 IMPLANT
DIFFUSER DRILL AIR PNEUMATIC (MISCELLANEOUS) ×2 IMPLANT
DRAPE C-ARM 42X72 X-RAY (DRAPES) ×4 IMPLANT
DRAPE HALF SHEET 40X57 (DRAPES) ×2 IMPLANT
DRAPE LAPAROTOMY 100X72X124 (DRAPES) ×2 IMPLANT
DRAPE SURG 17X23 STRL (DRAPES) ×8 IMPLANT
DRSG OPSITE POSTOP 4X6 (GAUZE/BANDAGES/DRESSINGS) ×2 IMPLANT
DURAPREP 26ML APPLICATOR (WOUND CARE) ×2 IMPLANT
ELECT REM PT RETURN 9FT ADLT (ELECTROSURGICAL) ×2
ELECTRODE REM PT RTRN 9FT ADLT (ELECTROSURGICAL) ×1 IMPLANT
EVACUATOR 1/8 PVC DRAIN (DRAIN) ×2 IMPLANT
GAUZE SPONGE 4X4 12PLY STRL (GAUZE/BANDAGES/DRESSINGS) IMPLANT
GAUZE SPONGE 4X4 16PLY XRAY LF (GAUZE/BANDAGES/DRESSINGS) IMPLANT
GLOVE BIO SURGEON STRL SZ 6 (GLOVE) ×2 IMPLANT
GLOVE BIO SURGEON STRL SZ 6.5 (GLOVE) ×2 IMPLANT
GLOVE BIOGEL PI IND STRL 6.5 (GLOVE) ×4 IMPLANT
GLOVE BIOGEL PI INDICATOR 6.5 (GLOVE) ×4
GLOVE ECLIPSE 9.0 STRL (GLOVE) ×4 IMPLANT
GLOVE EXAM NITRILE LRG STRL (GLOVE) IMPLANT
GLOVE EXAM NITRILE XL STR (GLOVE) IMPLANT
GLOVE EXAM NITRILE XS STR PU (GLOVE) IMPLANT
GLOVE SURG SS PI 6.5 STRL IVOR (GLOVE) ×10 IMPLANT
GOWN STRL REUS W/ TWL LRG LVL3 (GOWN DISPOSABLE) ×4 IMPLANT
GOWN STRL REUS W/ TWL XL LVL3 (GOWN DISPOSABLE) ×2 IMPLANT
GOWN STRL REUS W/TWL 2XL LVL3 (GOWN DISPOSABLE) IMPLANT
GOWN STRL REUS W/TWL LRG LVL3 (GOWN DISPOSABLE) ×4
GOWN STRL REUS W/TWL XL LVL3 (GOWN DISPOSABLE) ×2
GRAFT BN 5X1XSPNE CVD POST DBM (Bone Implant) ×1 IMPLANT
GRAFT BONE MAGNIFUSE 1X5CM (Bone Implant) ×1 IMPLANT
KIT BASIN OR (CUSTOM PROCEDURE TRAY) ×2 IMPLANT
KIT TURNOVER KIT B (KITS) ×2 IMPLANT
MILL MEDIUM DISP (BLADE) ×2 IMPLANT
NEEDLE HYPO 22GX1.5 SAFETY (NEEDLE) ×2 IMPLANT
NS IRRIG 1000ML POUR BTL (IV SOLUTION) ×2 IMPLANT
OIL CARTRIDGE MAESTRO DRILL (MISCELLANEOUS) ×2
PACK LAMINECTOMY NEURO (CUSTOM PROCEDURE TRAY) ×2 IMPLANT
ROD RADIUS 45MM (Rod) ×2 IMPLANT
ROD SPNL 45X5.5XNS TI RDS (Rod) ×2 IMPLANT
SCREW 5.75X40M (Screw) ×4 IMPLANT
SCREW 5.75X45MM (Screw) ×4 IMPLANT
SPONGE SURGIFOAM ABS GEL 100 (HEMOSTASIS) ×2 IMPLANT
STRIP CLOSURE SKIN 1/2X4 (GAUZE/BANDAGES/DRESSINGS) ×4 IMPLANT
SUT VIC AB 0 CT1 18XCR BRD8 (SUTURE) ×2 IMPLANT
SUT VIC AB 0 CT1 8-18 (SUTURE) ×2
SUT VIC AB 2-0 CT1 18 (SUTURE) ×2 IMPLANT
SUT VIC AB 3-0 SH 8-18 (SUTURE) ×4 IMPLANT
TOWEL GREEN STERILE (TOWEL DISPOSABLE) ×2 IMPLANT
TOWEL GREEN STERILE FF (TOWEL DISPOSABLE) ×2 IMPLANT
TRAY FOLEY MTR SLVR 16FR STAT (SET/KITS/TRAYS/PACK) ×2 IMPLANT
WATER STERILE IRR 1000ML POUR (IV SOLUTION) ×2 IMPLANT

## 2017-08-13 NOTE — Anesthesia Postprocedure Evaluation (Signed)
Anesthesia Post Note  Patient: Summer Lopez  Procedure(s) Performed: POSTERIOR LUMBAR INTERBODY FUSION - LUMBAR FOUR-LUMBAR FIVE (N/A Back)     Patient location during evaluation: PACU Anesthesia Type: General Level of consciousness: awake and alert Pain management: pain level controlled Vital Signs Assessment: post-procedure vital signs reviewed and stable Respiratory status: spontaneous breathing, nonlabored ventilation, respiratory function stable and patient connected to nasal cannula oxygen Cardiovascular status: blood pressure returned to baseline and stable Postop Assessment: no apparent nausea or vomiting Anesthetic complications: no    Last Vitals:  Vitals:   08/13/17 1405 08/13/17 1602  BP:  128/64  Pulse:  94  Resp:  12  Temp:  36.6 C  SpO2: 100% (!) 84%    Last Pain:  Vitals:   08/13/17 1602  TempSrc: Oral  PainSc:                  Montez Hageman

## 2017-08-13 NOTE — Anesthesia Procedure Notes (Addendum)
Procedure Name: Intubation Date/Time: 08/13/2017 8:01 AM Performed by: Montez Hageman, MD Pre-anesthesia Checklist: Patient identified, Emergency Drugs available, Suction available and Patient being monitored Patient Re-evaluated:Patient Re-evaluated prior to induction Oxygen Delivery Method: Circle System Utilized Preoxygenation: Pre-oxygenation with 100% oxygen Induction Type: IV induction, Cricoid Pressure applied and Rapid sequence Laryngoscope Size: Mac and 4 Grade View: Grade I Tube type: Oral Tube size: 7.0 mm Number of attempts: 1 Airway Equipment and Method: Stylet and Oral airway Placement Confirmation: ETT inserted through vocal cords under direct vision,  positive ETCO2 and breath sounds checked- equal and bilateral Secured at: 22 cm Tube secured with: Tape Dental Injury: Teeth and Oropharynx as per pre-operative assessment

## 2017-08-13 NOTE — Brief Op Note (Signed)
08/13/2017  11:05 AM  PATIENT:  Summer Lopez  47 y.o. female  PRE-OPERATIVE DIAGNOSIS:  Spondylolisthesis  POST-OPERATIVE DIAGNOSIS:  Spondylolisthesis  PROCEDURE:  Procedure(s): POSTERIOR LUMBAR INTERBODY FUSION - LUMBAR FOUR-LUMBAR FIVE (N/A)  SURGEON:  Surgeon(s) and Role:    * Shaunta Oncale, Mallie Mussel, MD - Primary    * Consuella Lose, MD - Assisting  PHYSICIAN ASSISTANT:   ASSISTANTS:    ANESTHESIA:   general  EBL:  500 mL   BLOOD ADMINISTERED:none  DRAINS: (med) Hemovact drain(s) in the epidural space with  Suction Open   LOCAL MEDICATIONS USED:  MARCAINE     SPECIMEN:  No Specimen  DISPOSITION OF SPECIMEN:  N/A  COUNTS:  YES  TOURNIQUET:  * No tourniquets in log *  DICTATION: .Dragon Dictation  PLAN OF CARE: Admit to inpatient   PATIENT DISPOSITION:  PACU - hemodynamically stable.   Delay start of Pharmacological VTE agent (>24hrs) due to surgical blood loss or risk of bleeding: yes

## 2017-08-13 NOTE — Anesthesia Procedure Notes (Signed)
Performed by: Montez Hageman, MD

## 2017-08-13 NOTE — Anesthesia Preprocedure Evaluation (Addendum)
Anesthesia Evaluation  Patient identified by MRN, date of birth, ID band Patient awake    Reviewed: Allergy & Precautions, NPO status , Patient's Chart, lab work & pertinent test results  Airway Mallampati: II  TM Distance: >3 FB Neck ROM: Full    Dental no notable dental hx. (+) Caps, Chipped, Dental Advisory Given,    Pulmonary neg pulmonary ROS,    Pulmonary exam normal breath sounds clear to auscultation       Cardiovascular hypertension, Pt. on medications Normal cardiovascular exam Rhythm:Regular Rate:Normal     Neuro/Psych negative neurological ROS  negative psych ROS   GI/Hepatic negative GI ROS, Neg liver ROS,   Endo/Other  diabetes, Type 2, Insulin DependentMorbid obesity  Renal/GU negative Renal ROS  negative genitourinary   Musculoskeletal negative musculoskeletal ROS (+)   Abdominal   Peds negative pediatric ROS (+)  Hematology negative hematology ROS (+)   Anesthesia Other Findings   Reproductive/Obstetrics negative OB ROS                            Anesthesia Physical Anesthesia Plan  ASA: III  Anesthesia Plan: General   Post-op Pain Management:    Induction: Intravenous, Rapid sequence and Cricoid pressure planned  PONV Risk Score and Plan: 3 and Ondansetron, Dexamethasone, Midazolam and Treatment may vary due to age or medical condition  Airway Management Planned: Oral ETT  Additional Equipment:   Intra-op Plan:   Post-operative Plan: Extubation in OR  Informed Consent: I have reviewed the patients History and Physical, chart, labs and discussed the procedure including the risks, benefits and alternatives for the proposed anesthesia with the patient or authorized representative who has indicated his/her understanding and acceptance.   Dental advisory given  Plan Discussed with: CRNA  Anesthesia Plan Comments:         Anesthesia Quick Evaluation

## 2017-08-13 NOTE — Transfer of Care (Signed)
Immediate Anesthesia Transfer of Care Note  Patient: Summer Lopez  Procedure(s) Performed: POSTERIOR LUMBAR INTERBODY FUSION - LUMBAR FOUR-LUMBAR FIVE (N/A Back)  Patient Location: PACU  Anesthesia Type:General  Level of Consciousness: awake and drowsy  Airway & Oxygen Therapy: Patient Spontanous Breathing and Patient connected to face mask oxygen  Post-op Assessment: Report given to RN, Post -op Vital signs reviewed and stable, Patient moving all extremities and Patient moving all extremities X 4  Post vital signs: Reviewed and stable  Last Vitals:  Vitals Value Taken Time  BP 97/57 08/13/2017 11:23 AM  Temp    Pulse 91 08/13/2017 11:26 AM  Resp 21 08/13/2017 11:26 AM  SpO2 100 % 08/13/2017 11:26 AM  Vitals shown include unvalidated device data.  Last Pain:  Vitals:   08/13/17 0702  PainSc: 7       Patients Stated Pain Goal: 3 (53/39/17 9217)  Complications: No apparent anesthesia complications

## 2017-08-13 NOTE — Progress Notes (Signed)
Blood sugar 216 Administered 5 Units Novolog per Marcell Barlow, MD Blood sugar 329 Blood sugar opposite side 301 Administered 10 units Novolog per Marcell Barlow, MD OK to transport pt to 3C-11 per Marcell Barlow, MD

## 2017-08-13 NOTE — Op Note (Signed)
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  L4-5 posterior lumbar interbody fusion utilizing interbody cages and locally harvested autograft  L4-5 posterior lateral arthrodesis utilizing nonsegmental pedicle screw fixation and local autograft  Surgeon:Xiara Knisley A.Atreyu Mak, M.D.  Asst. Surgeon: Kathyrn Sheriff  Anesthesia: General  Indication: 47 year old female with severe back and bilateral lower extremity symptoms consistent with radiculopathy and neurogenic claudication which is failed conservative management.  Work-up demonstrates evidence of extreme disc degeneration with an unstable grade 2 L4-5 degenerative spondylolisthesis and significant facet arthropathy and stenosis.  Patient presents now for decompression and fusion.  Operative note: After induction of anesthesia, patient position prone on the Wilson frame and appropriately padded.  Lumbar region prepped and draped sterilely.  Incision made overlying L4-5.  Dissection performed bilaterally.  Retractor placed.  Fluoroscopy used.  Levels confirmed.  Laminotomies and facetectomies then performed using Leksell Stann Mainland and Kerrison Stann Mainland to remove the inferior two thirds of the lamina of L4 the entire inferior facet and pars interarticularis of L4 bilaterally the majority of the superior facet of L5 bilaterally and the superior rim of the L5 lamina.  Ligament flavum elevated and resected.  Foraminotomies completed on the course exiting L4 and L5 nerve roots bilaterally.  Epidural venous plexus coagulated and cut.  Bilateral discectomies then performed at L4-5.  The space then prepared for interbody fusion.  All soft tissue was  removed from the interspace.  With the disc space distracted on patient's right side the disc space was further prepared for interbody fusion on the left.  A 10 mm Medtronic expandable cage packed with locally harvested autograft was then impacted into place and expanded to its full extent.  Distractor removed patient's right side.  The space further cleaned on the right side.  Morselized autograft packed in the interspace.  A second cage packed with autograft was then impacted into place and expanded to its full extent.  Pedicles at L4 and L5 presented identified using surface landmarks and intraoperative fluoroscopy.  Superficial bone overlying the pedicle was then removed using high-speed drill.  Each pedicle was then probed using a pedicle awl.  Each pedicle tract was probed and found to be solidly within the bone.  Each screw hole was then tapped and then 5.75 mm radius bran screws from [employer] were placed bilaterally at L4 and L5.  Final images reveal good position of the cages and the screws at the proper operative level with normal alignment of the spine.  Transverse processes decorticated.  Morselized autograft was packed posterior laterally.  Short segment titanium rod was then placed at the screw heads at L4 and L5.  Locking caps placed of the screws and locking caps and engaged with a construct under mild compression.  In addition to the autograft morselized allograft packages (magna fuse from Medtronic) were packed posterior laterally.  Vancomycin powder placed in deep wound space.  A medium Hemovac drain was placed in the deep wound space.  Wounds and closed in layers of Vicryl sutures.  Steri-Strips and sterile dressing were applied.  No apparent complications.  Patient tolerated the procedure well and she returns to the recovery room postop.  \

## 2017-08-13 NOTE — H&P (Signed)
Summer Lopez is an 47 y.o. female.   Chief Complaint: Back pain   HPI: 47 year old female with chronic and progressive back and bilateral lower extremity pain failing conservative management.  Work-up demonstrates evidence of an unstable grade 1/grade 2 degenerative spondylolisthesis with marked facet arthropathy and severe stenosis.  Patient presents now for decompression and fusion surgery in hopes of improving her symptoms.  Past Medical History:  Diagnosis Date  . Depression   . Diabetes mellitus   . Enlarged heart    denies  . Hypertension   . Migraine   . Thyroid disease     Past Surgical History:  Procedure Laterality Date  . ABDOMINAL HYSTERECTOMY    . GASTRIC BYPASS    . HERNIA REPAIR     umbilical  . KNEE SURGERY      Family History  Problem Relation Age of Onset  . Heart disease Unknown   . Hypertension Mother   . Diabetes Father   . Hypertension Father   . Arthritis Unknown    Social History:  reports that she has never smoked. She has never used smokeless tobacco. She reports that she does not drink alcohol or use drugs.  Allergies:  Allergies  Allergen Reactions  . Meloxicam Other (See Comments)    GI Upset-Ulcers  . Adhesive [Tape] Rash and Other (See Comments)    Prefers paper tape, adhesive tape burns her skin  . Morphine Nausea And Vomiting    Facility-Administered Medications Prior to Admission  Medication Dose Route Frequency Provider Last Rate Last Dose  . methylPREDNISolone acetate (DEPO-MEDROL) injection 40 mg  40 mg Intra-articular Once Carole Civil, MD       Medications Prior to Admission  Medication Sig Dispense Refill  . ACCU-CHEK SOFTCLIX LANCETS lancets Use lancets to check blood sugar tid    . amitriptyline (ELAVIL) 50 MG tablet Take 50 mg by mouth at bedtime.     Marland Kitchen atorvastatin (LIPITOR) 10 MG tablet Take 10 mg by mouth daily.  0  . Blood Glucose Monitoring Suppl (RA BLOOD GLUCOSE MONITOR) DEVI by Does not apply route.     Marland Kitchen buPROPion (WELLBUTRIN XL) 300 MG 24 hr tablet Take 300 mg by mouth daily.    . clonazePAM (KLONOPIN) 1 MG tablet Take 1-2 mg by mouth daily as needed for anxiety.    . clotrimazole (LOTRIMIN) 1 % cream Apply 1 application topically daily.     . cyclobenzaprine (FLEXERIL) 10 MG tablet Take 10 mg by mouth 2 (two) times daily as needed for muscle spasms.    . DULoxetine (CYMBALTA) 60 MG capsule Take 60 mg by mouth 2 (two) times daily.     . furosemide (LASIX) 20 MG tablet Take 20 mg by mouth 2 (two) times daily.  11  . HYDROcodone-acetaminophen (NORCO) 10-325 MG per tablet Take 1 tablet by mouth every 6 (six) hours as needed for moderate pain.     Marland Kitchen insulin regular (NOVOLIN R,HUMULIN R) 100 units/mL injection Inject 30 Units into the skin 2 (two) times daily before a meal.     . Insulin Syringe-Needle U-100 (INSULIN SYRINGE 1CC/31GX5/16") 31G X 5/16" 1 ML MISC by Does not apply route.    Javier Docker Oil (OMEGA-3) 500 MG CAPS Take 500 mg by mouth daily.    Marland Kitchen levothyroxine (SYNTHROID, LEVOTHROID) 150 MCG tablet Take 150 mcg by mouth daily.     Marland Kitchen lisinopril (PRINIVIL,ZESTRIL) 5 MG tablet Take 5 mg by mouth daily.    . metoprolol  succinate (TOPROL-XL) 25 MG 24 hr tablet Take 25 mg by mouth daily.    Marland Kitchen ibuprofen (ADVIL,MOTRIN) 600 MG tablet Take 1 tablet (600 mg total) every 6 (six) hours as needed by mouth. (Patient not taking: Reported on 06/21/2017) 30 tablet 0  . ondansetron (ZOFRAN) 4 MG tablet Take 1 tablet (4 mg total) by mouth every 6 (six) hours. (Patient not taking: Reported on 06/21/2017) 12 tablet 0    Results for orders placed or performed during the hospital encounter of 08/13/17 (from the past 48 hour(s))  Glucose, capillary     Status: Abnormal   Collection Time: 08/13/17  6:19 AM  Result Value Ref Range   Glucose-Capillary 123 (H) 70 - 99 mg/dL  Basic metabolic panel     Status: Abnormal   Collection Time: 08/13/17  6:49 AM  Result Value Ref Range   Sodium 136 135 - 145 mmol/L    Potassium 4.3 3.5 - 5.1 mmol/L   Chloride 103 98 - 111 mmol/L    Comment: Please note change in reference range.   CO2 25 22 - 32 mmol/L   Glucose, Bld 123 (H) 70 - 99 mg/dL    Comment: Please note change in reference range.   BUN 16 6 - 20 mg/dL    Comment: Please note change in reference range.   Creatinine, Ser 0.95 0.44 - 1.00 mg/dL   Calcium 9.3 8.9 - 10.3 mg/dL   GFR calc non Af Amer >60 >60 mL/min   GFR calc Af Amer >60 >60 mL/min    Comment: (NOTE) The eGFR has been calculated using the CKD EPI equation. This calculation has not been validated in all clinical situations. eGFR's persistently <60 mL/min signify possible Chronic Kidney Disease.    Anion gap 8 5 - 15    Comment: Performed at [facility] [address], [place], [place] [address]   No results found.  Pertinent items noted in HPI and remainder of comprehensive ROS otherwise negative.  Blood pressure 111/72, pulse 100, temperature 98.2 F (36.8 C), resp. rate 20, height 5' 7"  (1.702 m), weight (!) 142.8 kg (314 lb 14.4 oz), SpO2 98 %.  Patient is awake and alert.  She is oriented and appropriate.  Speech is fluent.  Judgment and insight are intact.  Cranial nerve function normal bilateral.  Motor examination reveals some mild weakness of dorsiflexion bilateral.  Sensory examination nonfocal but some patchy distal sensory loss in both lower extremities.  Reflexes hypoactive but symmetric.  Achilles reflexes are completely absent.  No evidence of long track signs.  Gait antalgic.  Posture moderately flexed.  Patient is morbidly obese.  Examination head ears eyes nose throat is unremarkable her chest and abdomen are benign.  Extremities are free from injury deformity. Assessment/Plan Unstable degenerative spondylolisthesis with severe stenosis.  Plan bilateral L4-5 decompressive laminotomy and foraminotomy followed by posterior lumbar interbody fusion utilizing interbody cages, locally harvested autograft, and  augmented with posterior lateral arthrodesis utilizing nonsegmental pedicle screw fixation and local autografting.  Risks and benefits of been explained.  Patient wishes to proceed.  Mallie Mussel A Summer Lopez 08/13/2017, 7:45 AM

## 2017-08-13 NOTE — Evaluation (Signed)
Physical Therapy Evaluation Patient Details Name: Summer Lopez MRN: [id number] DOB: Nov 14, 1970 Today's Date: 08/13/2017   History of Present Illness  Pt presents for L4-5 PLIF after failure of conservative mgmt of radicular pain BLE's, PMH: DM, thyroid disease.   Clinical Impression  Patient is s/p above surgery resulting in functional limitations due to the deficits listed below (see PT Problem List). Pt ambulated 175' with RW and symmetrical pattern without having to stop, c/o only of pain in back, not LE's. Pt able to ambulate without RW but gait immediately asymmetrical and antalgic and would not recommend she do this at this point.  Patient will benefit from skilled PT to increase their independence and safety with mobility to allow discharge to the venue listed below.       Follow Up Recommendations Home health PT    Equipment Recommendations  Rolling walker with 5" wheels(bariatric)    Recommendations for Other Services       Precautions / Restrictions Precautions Precautions: Back Precaution Booklet Issued: Yes (comment) Precaution Comments: reviewed back precautions and proper posture Required Braces or Orthoses: Spinal Brace Spinal Brace: Applied in sitting position;Lumbar corset Restrictions Weight Bearing Restrictions: No      Mobility  Bed Mobility               General bed mobility comments: pt received sitting edge of bed so verbally talked through log rolling and bed mobility  Transfers Overall transfer level: Needs assistance Equipment used: Rolling walker (2 wheeled) Transfers: Sit to/from Stand Sit to Stand: Supervision         General transfer comment: pt stood slowly and safely  Ambulation/Gait Ambulation/Gait assistance: Supervision Gait Distance (Feet): 200 Feet Assistive device: Rolling walker (2 wheeled);None Gait Pattern/deviations: Step-through pattern;Antalgic Gait velocity: decreased Gait velocity interpretation: 1.31 - 2.62  ft/sec, indicative of limited community ambulator General Gait Details: with RW pt's gait pattern was symmetrical and she was able to ambulate entire distance without stopping (175'). Without RW, pt was able to ambulate 25' but gait pattern was asymmetrical and antalgic and have concerns for further damage to back or hips is this pattern is sustained  Financial trader Rankin (Stroke Patients Only)       Balance Overall balance assessment: No apparent balance deficits (not formally assessed)                                           Pertinent Vitals/Pain Pain Assessment: Faces Faces Pain Scale: Hurts even more Pain Location: back Pain Descriptors / Indicators: Aching;Operative site guarding Pain Intervention(s): Limited activity within patient's tolerance;Monitored during session    Home Living Family/patient expects to be discharged to:: Private residence Living Arrangements: Spouse/significant other;Children Available Help at Discharge: Family;Available PRN/intermittently Type of Home: Apartment Home Access: Level entry     Home Layout: One level Home Equipment: Cane - single point      Prior Function Level of Independence: Independent with assistive device(s)         Comments: pt ambulated with SPC but could go only very short distances before having to stop due to back and LE pain     Hand Dominance        Extremity/Trunk Assessment   Upper Extremity Assessment Upper Extremity Assessment: Overall WFL for tasks assessed  Lower Extremity Assessment Lower Extremity Assessment: Generalized weakness    Cervical / Trunk Assessment Cervical / Trunk Assessment: Normal  Communication   Communication: No difficulties  Cognition Arousal/Alertness: Lethargic;Suspect due to medications Behavior During Therapy: [name] for tasks assessed/performed Overall Cognitive Status: Within Functional Limits for tasks  assessed                                        General Comments General comments (skin integrity, edema, etc.): discussed activity level upon return home    Exercises     Assessment/Plan    PT Assessment Patient needs continued PT services  PT Problem List Decreased strength;Decreased mobility;Decreased knowledge of use of DME;Decreased knowledge of precautions       PT Treatment Interventions DME instruction;Gait training;Functional mobility training;Therapeutic activities;Therapeutic exercise;Patient/family education    PT Goals (Current goals can be found in the Care Plan section)  Acute Rehab PT Goals Patient Stated Goal: be able to take care of granddaughter PT Goal Formulation: With patient Time For Goal Achievement: 08/27/17 Potential to Achieve Goals: Good    Frequency Min 5X/week   Barriers to discharge        Co-evaluation               AM-PAC PT "6 Clicks" Daily Activity  Outcome Measure Difficulty turning over in bed (including adjusting bedclothes, sheets and blankets)?: A Little Difficulty moving from lying on back to sitting on the side of the bed? : A Little Difficulty sitting down on and standing up from a chair with arms (e.g., wheelchair, bedside commode, etc,.)?: A Little Help needed moving to and from a bed to chair (including a wheelchair)?: A Little Help needed walking in hospital room?: A Little Help needed climbing 3-5 steps with a railing? : A Little 6 Click Score: 18    End of Session Equipment Utilized During Treatment: Back brace Activity Tolerance: Patient tolerated treatment well Patient left: in chair;with call bell/phone within reach Nurse Communication: Mobility status PT Visit Diagnosis: Muscle weakness (generalized) (M62.81);Pain Pain - Right/Left: (B) Pain - part of body: (back)    Time: 1725-1750 PT Time Calculation (min) (ACUTE ONLY): 25 min   Charges:   PT Evaluation $PT Eval Moderate Complexity:  1 Mod PT Treatments $Gait Training: 8-22 mins   PT G Codes:        [place]  [place] 08/13/2017, 5:59 PM

## 2017-08-14 LAB — GLUCOSE, CAPILLARY
Glucose-Capillary: 385 mg/dL — ABNORMAL HIGH (ref 70–99)
Glucose-Capillary: 235 mg/dL — ABNORMAL HIGH (ref 70–99)

## 2017-08-14 MED ORDER — CHLORHEXIDINE GLUCONATE CLOTH 2 % EX PADS
6.0000 | MEDICATED_PAD | Freq: Every day | CUTANEOUS | Status: DC
  Administered 2017-08-14: 6 via TOPICAL

## 2017-08-14 MED ORDER — OXYCODONE HCL 10 MG PO TABS: 10.0000 mg | ORAL_TABLET | ORAL | 0 refills | Status: DC | PRN

## 2017-08-14 MED ORDER — DIAZEPAM 5 MG PO TABS: 5.0000 mg | ORAL_TABLET | Freq: Four times a day (QID) | ORAL | 0 refills | Status: DC | PRN

## 2017-08-14 MED FILL — Thrombin (Recombinant) For Soln 20000 Unit: CUTANEOUS | Qty: 1 | Status: AC

## 2017-08-14 MED FILL — Heparin Sodium (Porcine) Inj 1000 Unit/ML: INTRAMUSCULAR | Qty: 30 | Status: AC

## 2017-08-14 MED FILL — Sodium Chloride IV Soln 0.9%: INTRAVENOUS | Qty: 1000 | Status: AC

## 2017-08-14 NOTE — Progress Notes (Signed)
Physical Therapy Treatment Patient Details Name: Summer Lopez MRN: [id number] DOB: April 04, 1970 Today's Date: 08/14/2017    History of Present Illness Pt presents for L4-5 PLIF after failure of conservative mgmt of radicular pain BLE's, PMH: DM, thyroid disease.     PT Comments    Pt progressing towards physical therapy goals. Reports increased lightheadedness and SOB initially with gait training, however reports improvement in symptoms with standing rest break. Upon return to the room BP was 120/63. Encouraged pt to discuss with surgeon recommended wearing schedule for brace as pt has difficulty achieving a proper fit in sitting due to increased body habitus. Pt was instructed in donning brace in standing and adjusting because of this. Will continue to follow.   Follow Up Recommendations  Home health PT     Equipment Recommendations  Rolling walker with 5" wheels(bariatric)    Recommendations for Other Services       Precautions / Restrictions Precautions Precautions: Back Precaution Booklet Issued: Yes (comment) Precaution Comments: reviewed back precautions and proper posture Required Braces or Orthoses: Spinal Brace Spinal Brace: Applied in sitting position;Lumbar corset Restrictions Weight Bearing Restrictions: No    Mobility  Bed Mobility Overal bed mobility: Needs Assistance Bed Mobility: Sidelying to Sit   Sidelying to sit: Supervision       General bed mobility comments: Use of rails despite cues to avoid to simulate home envrionment. Pt was able to transition from sidelying to sitting without assist, however required step-by-step cues to complete with proper technique.   Transfers Overall transfer level: Needs assistance Equipment used: Rolling walker (2 wheeled) Transfers: Sit to/from Stand Sit to Stand: Supervision         General transfer comment: VC's for hand placement on seated surface for safety. Pt insiting on donning brace in standing due to  increased body habitus and difficulty achieving proper fit in sitting.   Ambulation/Gait Ambulation/Gait assistance: Supervision Gait Distance (Feet): 200 Feet Assistive device: Rolling walker (2 wheeled);None Gait Pattern/deviations: Step-through pattern;Decreased stride length Gait velocity: Decreased Gait velocity interpretation: 1.31 - 2.62 ft/sec, indicative of limited community ambulator General Gait Details: Pt used RW for most of session and throughout hallway ambulation. In the room pt was able to ambulate without RW but reaching out for external support on walls and furniture.    Stairs             Wheelchair Mobility    Modified Rankin (Stroke Patients Only)       Balance Overall balance assessment: No apparent balance deficits (not formally assessed)                                          Cognition Arousal/Alertness: Awake/alert Behavior During Therapy: WFL for tasks assessed/performed Overall Cognitive Status: Within Functional Limits for tasks assessed                                        Exercises      General Comments        Pertinent Vitals/Pain Pain Assessment: 0-10 Pain Score: 5  Pain Location: back Pain Descriptors / Indicators: Aching;Operative site guarding Pain Intervention(s): Monitored during session    Home Living  Prior Function            PT Goals (current goals can now be found in the care plan section) Acute Rehab PT Goals Patient Stated Goal: be able to take care of granddaughter PT Goal Formulation: With patient Time For Goal Achievement: 08/27/17 Potential to Achieve Goals: Good Progress towards PT goals: Progressing toward goals    Frequency    Min 5X/week      PT Plan Current plan remains appropriate    Co-evaluation              AM-PAC PT "6 Clicks" Daily Activity  Outcome Measure  Difficulty turning over in bed (including  adjusting bedclothes, sheets and blankets)?: A Little Difficulty moving from lying on back to sitting on the side of the bed? : A Little Difficulty sitting down on and standing up from a chair with arms (e.g., wheelchair, bedside commode, etc,.)?: A Little Help needed moving to and from a bed to chair (including a wheelchair)?: A Little Help needed walking in hospital room?: A Little Help needed climbing 3-5 steps with a railing? : A Little 6 Click Score: 18    End of Session Equipment Utilized During Treatment: Back brace Activity Tolerance: Patient tolerated treatment well Patient left: in chair;with call bell/phone within reach Nurse Communication: Mobility status PT Visit Diagnosis: Muscle weakness (generalized) (M62.81);Pain Pain - part of body: (back)     Time: 3016-0109 PT Time Calculation (min) (ACUTE ONLY): 22 min  Charges:  $Gait Training: 8-22 mins                    G Codes:       Rolinda Roan, PT, DPT Acute Rehabilitation Services Pager: [place] 08/14/2017, 8:15 AM

## 2017-08-14 NOTE — Progress Notes (Signed)
Patient's blood sugar elevated at 2125 w CBG 433. Scheduled Lantus  10 U and SSI  novolog 5 U given at 2137 as ordered. Rechecked CBG at 2216-458. Patient complained of being thirsty and voiding more than usual. On call PA Costella notified with new orders received - NPH 30 u BID, Novolin R 10 u x 1 dose and SSI Novolin R. Administered NPH and Novolin R at 2317. Rechecked CBG at 2158-727. No complaints verbalized. Will continue to monitor.

## 2017-08-14 NOTE — Discharge Summary (Signed)
Physician Discharge Summary  Patient ID: Summer Lopez MRN: [id number] DOB/AGE: 47-04-1970 47 y.o.  Admit date: 08/13/2017 Discharge date: 08/14/2017  Admission Diagnoses:  Discharge Diagnoses:  Active Problems:   Degenerative spondylolisthesis    Discharged Condition: good  Hospital Course: Patient admitted to the hospital where he she underwent an uncomplicated Q1-1 decompression and fusion.  Postoperatively she is doing well.  Back and lower extremity pain much improved.  Standing and walking without difficulty.  Ready for discharge home.  Consults:   Significant Diagnostic Studies:   Treatments:   Discharge Exam: Blood pressure 120/63, pulse (!) 107, temperature 98.3 F (36.8 C), temperature source Oral, resp. rate 18, height 5' 7"  (1.702 m), weight (!) 142.8 kg (314 lb 14.4 oz), SpO2 97 %. Awake and alert.  Oriented and appropriate.  Cranial nerve function intact.  Motor and sensory function extremities normal.  Wound clean and dry.  Chest and abdomen benign.  Disposition: Discharge disposition: 01-Home or Self Care        Allergies as of 08/14/2017      Reactions   Meloxicam Other (See Comments)   GI Upset-Ulcers   Adhesive [tape] Rash, Other (See Comments)   Prefers paper tape, adhesive tape burns her skin   Morphine Nausea And Vomiting      Medication List    TAKE these medications   ACCU-CHEK SOFTCLIX LANCETS lancets Use lancets to check blood sugar tid   amitriptyline 50 MG tablet Commonly known as:  ELAVIL Take 50 mg by mouth at bedtime.   atorvastatin 10 MG tablet Commonly known as:  LIPITOR Take 10 mg by mouth daily.   buPROPion 300 MG 24 hr tablet Commonly known as:  WELLBUTRIN XL Take 300 mg by mouth daily.   clonazePAM 1 MG tablet Commonly known as:  KLONOPIN Take 1-2 mg by mouth daily as needed for anxiety.   clotrimazole 1 % cream Commonly known as:  LOTRIMIN Apply 1 application topically daily.   cyclobenzaprine 10 MG  tablet Commonly known as:  FLEXERIL Take 10 mg by mouth 2 (two) times daily as needed for muscle spasms.   diazepam 5 MG tablet Commonly known as:  VALIUM Take 1-2 tablets (5-10 mg total) by mouth every 6 (six) hours as needed for muscle spasms.   DULoxetine 60 MG capsule Commonly known as:  CYMBALTA Take 60 mg by mouth 2 (two) times daily.   furosemide 20 MG tablet Commonly known as:  LASIX Take 20 mg by mouth 2 (two) times daily.   HYDROcodone-acetaminophen 10-325 MG tablet Commonly known as:  NORCO Take 1 tablet by mouth every 6 (six) hours as needed for moderate pain.   insulin regular 100 units/mL injection Commonly known as:  NOVOLIN R,HUMULIN R Inject 30 Units into the skin 2 (two) times daily before a meal.   INSULIN SYRINGE 1CC/31GX5/16" 31G X 5/16" 1 ML Misc by Does not apply route.   levothyroxine 150 MCG tablet Commonly known as:  SYNTHROID, LEVOTHROID Take 150 mcg by mouth daily.   lisinopril 5 MG tablet Commonly known as:  PRINIVIL,ZESTRIL Take 5 mg by mouth daily.   metoprolol succinate 25 MG 24 hr tablet Commonly known as:  TOPROL-XL Take 25 mg by mouth daily.   Omega-3 500 MG Caps Take 500 mg by mouth daily.   Oxycodone HCl 10 MG Tabs Take 1 tablet (10 mg total) by mouth every 3 (three) hours as needed for severe pain ((score 7 to 10)).   RA BLOOD GLUCOSE MONITOR  Devi by Does not apply route.            Durable Medical Equipment  (From admission, onward)        Start     Ordered   08/13/17 1749  For home use only DME Walker wide  Once    Comments:  PATIENT WEIGHS 314 POUNDS  Question:  Patient needs a walker to treat with the following condition  Answer:  S/P lumbar spinal fusion   08/13/17 1749   08/13/17 1408  DME Walker rolling  Once    Question:  Patient needs a walker to treat with the following condition  Answer:  Degenerative spondylolisthesis   08/13/17 1407   08/13/17 1408  DME 3 n 1  Once     08/13/17 1407        Signed: Mallie Mussel A Raylan Troiani 08/14/2017, 8:44 AM

## 2017-08-14 NOTE — Evaluation (Signed)
Occupational Therapy Evaluation Patient Details Name: Summer Lopez MRN: [id number] DOB: February 07, 1970 Today's Date: 08/14/2017    History of Present Illness Pt presents for L4-5 PLIF after failure of conservative mgmt of radicular pain BLE's, PMH: DM, thyroid disease.    Clinical Impression   PTA, pt was living with her husband and was independent. Currently, pt requires Min A for ADLs and Min Guard A functional mobility using RW. Provided education and handout on back precautions, brace management, bed mobility, LB ADLs with AE, toileting, and tub transfer with 3N1; pt demonstrated and verbalized understanding. Answered all pt questions in preparation for dc later today. Recommend dc home once medically stable per physician. All acute OT needs met and will sign off. Thank you.     Follow Up Recommendations  No OT follow up;Supervision/Assistance - 24 hour    Equipment Recommendations  3 in 1 bedside commode(Bariatric)    Recommendations for Other Services PT consult     Precautions / Restrictions Precautions Precautions: Back Precaution Booklet Issued: Yes (comment) Precaution Comments: reviewed back precautions and proper posture Required Braces or Orthoses: Spinal Brace Spinal Brace: Applied in sitting position;Lumbar corset Restrictions Weight Bearing Restrictions: No      Mobility Bed Mobility Overal bed mobility: Needs Assistance Bed Mobility: Sit to Sidelying   Sidelying to sit: Supervision     Sit to sidelying: Supervision General bed mobility comments: supervision for safety and cues for sequencing safely  Transfers Overall transfer level: Needs assistance Equipment used: Rolling walker (2 wheeled) Transfers: Sit to/from Stand Sit to Stand: Supervision         General transfer comment: VC's for hand placement on seated surface for safety. Pt insiting on donning brace in standing due to increased body habitus and difficulty achieving proper fit in sitting.      Balance Overall balance assessment: No apparent balance deficits (not formally assessed)                                         ADL either performed or assessed with clinical judgement   ADL Overall ADL's : Needs assistance/impaired Eating/Feeding: Set up;Sitting   Grooming: Set up;Supervision/safety;Standing Grooming Details (indicate cue type and reason): Educated on compensatory techniques to adhere to back precautions Upper Body Bathing: Minimal assistance;Sitting   Lower Body Bathing: Minimal assistance;Sit to/from stand   Upper Body Dressing : Minimal assistance;Cueing for sequencing;Sitting Upper Body Dressing Details (indicate cue type and reason): Min A for proper positioning of brace. Pt donning dress while standing without difficulty. Cues for correct sequencing to don/doff brace Lower Body Dressing: Minimal assistance;With adaptive equipment;Cueing for compensatory techniques;Cueing for back precautions;Sit to/from stand Lower Body Dressing Details (indicate cue type and reason): Min A for use of AE. Educated on compensatory techniques to adhere to back precautions with AE Toilet Transfer: Min guard;Ambulation;RW(simulated to recliner)   Toileting- Clothing Manipulation and Hygiene: Min guard;Sit to/from stand Toileting - Clothing Manipulation Details (indicate cue type and reason): Educated on compensatory techniques to adhere to back precautions. Providing information on toilet aide if pt has difficulty with toilet hygiene after BM Tub/ Shower Transfer: Tub transfer;Ambulation;Rolling walker;Min guard Tub/Shower Transfer Details (indicate cue type and reason): Educated on compensatory techniques for safe tub transfer. Pt with decreased BLEs strength and difficulty lifting legs up and over tub.  Functional mobility during ADLs: Min guard;Rolling walker General ADL Comments: Pt with decreased functional  performance limited by precautions, pain, and  increased body habitus. Providing education on back precautions, bed mobility, brace management, LB ADLs, toileting, and tub transfers. Pt verbalized and demonstrated understanding.      Vision Baseline Vision/History: Wears glasses Wears Glasses: Reading only Patient Visual Report: No change from baseline       Perception     Praxis      Pertinent Vitals/Pain Pain Assessment: 0-10 Pain Score: 5  Pain Location: back Pain Descriptors / Indicators: Aching;Operative site guarding Pain Intervention(s): Monitored during session;Limited activity within patient's tolerance;Repositioned     Hand Dominance Right   Extremity/Trunk Assessment Upper Extremity Assessment Upper Extremity Assessment: Overall WFL for tasks assessed   Lower Extremity Assessment Lower Extremity Assessment: Generalized weakness   Cervical / Trunk Assessment Cervical / Trunk Assessment: Other exceptions Cervical / Trunk Exceptions: s/p  L4-5 PLIF   Communication Communication Communication: No difficulties   Cognition Arousal/Alertness: Awake/alert Behavior During Therapy: WFL for tasks assessed/performed Overall Cognitive Status: Within Functional Limits for tasks assessed                                     General Comments       Exercises     Shoulder Instructions      Home Living Family/patient expects to be discharged to:: Private residence Living Arrangements: Spouse/significant other;Children Available Help at Discharge: Family;Available PRN/intermittently Type of Home: Apartment Home Access: Level entry     Home Layout: One level     Bathroom Shower/Tub: Teacher, early years/pre: Standard     Home Equipment: Cane - single point          Prior Functioning/Environment Level of Independence: Independent with assistive device(s)        Comments: pt ambulated with SPC but could go only very short distances before having to stop due to back and LE pain         OT Problem List: Decreased strength;Decreased range of motion;Decreased activity tolerance;Impaired balance (sitting and/or standing);Decreased knowledge of use of DME or AE;Decreased knowledge of precautions;Pain      OT Treatment/Interventions:      OT Goals(Current goals can be found in the care plan section) Acute Rehab OT Goals Patient Stated Goal: be able to take care of granddaughter OT Goal Formulation: All assessment and education complete, DC therapy  OT Frequency:     Barriers to D/C:            Co-evaluation              AM-PAC PT "6 Clicks" Daily Activity     Outcome Measure Help from another person eating meals?: None Help from another person taking care of personal grooming?: None Help from another person toileting, which includes using toliet, bedpan, or urinal?: A Little Help from another person bathing (including washing, rinsing, drying)?: A Little Help from another person to put on and taking off regular upper body clothing?: A Little Help from another person to put on and taking off regular lower body clothing?: A Little 6 Click Score: 20   End of Session Equipment Utilized During Treatment: Back brace;Rolling walker Nurse Communication: Mobility status;Precautions  Activity Tolerance: Patient tolerated treatment well Patient left: in bed;with call bell/phone within reach  OT Visit Diagnosis: Unsteadiness on feet (R26.81);Other abnormalities of gait and mobility (R26.89);Muscle weakness (generalized) (M62.81);Pain Pain - part of body: (Back)  Time: 5597-4163 OT Time Calculation (min): 30 min Charges:  OT General Charges $OT Visit: 1 Visit OT Evaluation $OT Eval Moderate Complexity: 1 Mod OT Treatments $Self Care/Home Management : 8-22 mins G-Codes:     Argie Applegate MSOT, OTR/L Acute Rehab Pager: [phone] Office: [place] 08/14/2017, 9:08 AM

## 2017-08-14 NOTE — Progress Notes (Signed)
Patient is discharged from room 3C11 at this time. Alert and in stable condition. IV site d/c'd and instructions read to patient with understanding verbalized. Left unit via wheelchair with all belongings at side.

## 2017-08-14 NOTE — Discharge Instructions (Signed)

## 2017-12-04 ENCOUNTER — Ambulatory Visit
Admission: RE | Admit: 2017-12-04 | Discharge: 2017-12-04 | Disposition: A | Discharge status: Home / Self Care | Payer: Medicare Other | Source: Ambulatory Visit | Attending: Nurse Practitioner | Admitting: Nurse Practitioner

## 2017-12-04 DIAGNOSIS — Z1231 Encounter for screening mammogram for malignant neoplasm of breast: Secondary | ICD-10-CM

## 2018-07-29 ENCOUNTER — Other Ambulatory Visit: Payer: Self-pay | Admitting: Family Medicine

## 2018-07-29 DIAGNOSIS — M545 Low back pain, unspecified: Secondary | ICD-10-CM

## 2018-07-29 DIAGNOSIS — G8929 Other chronic pain: Secondary | ICD-10-CM

## 2018-08-13 ENCOUNTER — Other Ambulatory Visit: Payer: Self-pay

## 2018-08-13 ENCOUNTER — Ambulatory Visit
Admission: RE | Admit: 2018-08-13 | Discharge: 2018-08-13 | Disposition: A | Discharge status: Home / Self Care | Payer: Medicare Other | Source: Ambulatory Visit | Attending: Family Medicine | Admitting: Family Medicine

## 2018-08-13 DIAGNOSIS — G8929 Other chronic pain: Secondary | ICD-10-CM

## 2018-08-13 DIAGNOSIS — M545 Low back pain, unspecified: Secondary | ICD-10-CM

## 2018-08-13 MED ORDER — IOPAMIDOL (ISOVUE-M 200) INJECTION 41%
1.0000 mL | Freq: Once | INTRAMUSCULAR | Status: AC
  Administered 2018-08-13: 09:00:00 1 mL via EPIDURAL

## 2018-08-13 MED ORDER — METHYLPREDNISOLONE ACETATE 40 MG/ML INJ SUSP (RADIOLOG
120.0000 mg | Freq: Once | INTRAMUSCULAR | Status: AC
  Administered 2018-08-13: 120 mg via EPIDURAL

## 2018-11-25 ENCOUNTER — Other Ambulatory Visit: Payer: Self-pay | Admitting: Nurse Practitioner

## 2018-11-25 DIAGNOSIS — Z1231 Encounter for screening mammogram for malignant neoplasm of breast: Secondary | ICD-10-CM

## 2019-01-01 ENCOUNTER — Other Ambulatory Visit: Payer: Self-pay

## 2019-01-01 ENCOUNTER — Ambulatory Visit
Admission: RE | Admit: 2019-01-01 | Discharge: 2019-01-01 | Disposition: A | Discharge status: Home / Self Care | Payer: Medicare Other | Source: Ambulatory Visit | Attending: Nurse Practitioner | Admitting: Nurse Practitioner

## 2019-01-01 DIAGNOSIS — Z1231 Encounter for screening mammogram for malignant neoplasm of breast: Secondary | ICD-10-CM

## 2019-01-13 ENCOUNTER — Ambulatory Visit: Payer: Medicare Other

## 2019-05-08 ENCOUNTER — Encounter (HOSPITAL_COMMUNITY): Payer: Self-pay

## 2019-05-08 ENCOUNTER — Other Ambulatory Visit: Payer: Self-pay

## 2019-05-08 DIAGNOSIS — E785 Hyperlipidemia, unspecified: Secondary | ICD-10-CM | POA: Diagnosis present

## 2019-05-08 DIAGNOSIS — M797 Fibromyalgia: Secondary | ICD-10-CM | POA: Diagnosis present

## 2019-05-08 DIAGNOSIS — K7581 Nonalcoholic steatohepatitis (NASH): Secondary | ICD-10-CM | POA: Diagnosis present

## 2019-05-08 DIAGNOSIS — K5900 Constipation, unspecified: Secondary | ICD-10-CM | POA: Diagnosis not present

## 2019-05-08 DIAGNOSIS — Z6841 Body Mass Index (BMI) 40.0 and over, adult: Secondary | ICD-10-CM

## 2019-05-08 DIAGNOSIS — E039 Hypothyroidism, unspecified: Secondary | ICD-10-CM | POA: Diagnosis present

## 2019-05-08 DIAGNOSIS — Z9884 Bariatric surgery status: Secondary | ICD-10-CM

## 2019-05-08 DIAGNOSIS — Z20822 Contact with and (suspected) exposure to covid-19: Secondary | ICD-10-CM | POA: Diagnosis present

## 2019-05-08 DIAGNOSIS — K8012 Calculus of gallbladder with acute and chronic cholecystitis without obstruction: Principal | ICD-10-CM | POA: Diagnosis present

## 2019-05-08 DIAGNOSIS — E1165 Type 2 diabetes mellitus with hyperglycemia: Secondary | ICD-10-CM | POA: Diagnosis not present

## 2019-05-08 DIAGNOSIS — Z91048 Other nonmedicinal substance allergy status: Secondary | ICD-10-CM

## 2019-05-08 DIAGNOSIS — K82A1 Gangrene of gallbladder in cholecystitis: Secondary | ICD-10-CM | POA: Diagnosis present

## 2019-05-08 DIAGNOSIS — Z79899 Other long term (current) drug therapy: Secondary | ICD-10-CM

## 2019-05-08 DIAGNOSIS — Z8249 Family history of ischemic heart disease and other diseases of the circulatory system: Secondary | ICD-10-CM

## 2019-05-08 DIAGNOSIS — K660 Peritoneal adhesions (postprocedural) (postinfection): Secondary | ICD-10-CM | POA: Diagnosis present

## 2019-05-08 DIAGNOSIS — Z79890 Hormone replacement therapy (postmenopausal): Secondary | ICD-10-CM

## 2019-05-08 DIAGNOSIS — Z794 Long term (current) use of insulin: Secondary | ICD-10-CM

## 2019-05-08 DIAGNOSIS — N179 Acute kidney failure, unspecified: Secondary | ICD-10-CM | POA: Diagnosis not present

## 2019-05-08 DIAGNOSIS — G43909 Migraine, unspecified, not intractable, without status migrainosus: Secondary | ICD-10-CM | POA: Diagnosis present

## 2019-05-08 DIAGNOSIS — D62 Acute posthemorrhagic anemia: Secondary | ICD-10-CM | POA: Diagnosis not present

## 2019-05-08 DIAGNOSIS — Z888 Allergy status to other drugs, medicaments and biological substances status: Secondary | ICD-10-CM

## 2019-05-08 DIAGNOSIS — I10 Essential (primary) hypertension: Secondary | ICD-10-CM | POA: Diagnosis present

## 2019-05-08 DIAGNOSIS — Z833 Family history of diabetes mellitus: Secondary | ICD-10-CM

## 2019-05-08 DIAGNOSIS — E119 Type 2 diabetes mellitus without complications: Secondary | ICD-10-CM | POA: Diagnosis present

## 2019-05-08 DIAGNOSIS — Z885 Allergy status to narcotic agent status: Secondary | ICD-10-CM

## 2019-05-08 LAB — CBC
HCT: 36.6 % (ref 36.0–46.0)
Hemoglobin: 11.3 g/dL — ABNORMAL LOW (ref 12.0–15.0)
MCH: 28.3 pg (ref 26.0–34.0)
MCHC: 30.9 g/dL (ref 30.0–36.0)
MCV: 91.5 fL (ref 80.0–100.0)
Platelets: 352 10*3/uL (ref 150–400)
RBC: 4 MIL/uL (ref 3.87–5.11)
RDW: 12.8 % (ref 11.5–15.5)
WBC: 10.6 10*3/uL — ABNORMAL HIGH (ref 4.0–10.5)
nRBC: 0 % (ref 0.0–0.2)

## 2019-05-08 LAB — COMPREHENSIVE METABOLIC PANEL
ALT: 11 U/L (ref 0–44)
AST: 12 U/L — ABNORMAL LOW (ref 15–41)
Albumin: 3.4 g/dL — ABNORMAL LOW (ref 3.5–5.0)
Alkaline Phosphatase: 69 U/L (ref 38–126)
Anion gap: 11 (ref 5–15)
BUN: 11 mg/dL (ref 6–20)
CO2: 24 mmol/L (ref 22–32)
Calcium: 8.9 mg/dL (ref 8.9–10.3)
Chloride: 99 mmol/L (ref 98–111)
Creatinine, Ser: 0.72 mg/dL (ref 0.44–1.00)
GFR calc Af Amer: >60 mL/min (ref >60)
GFR calc non Af Amer: >60 mL/min (ref >60)
Glucose, Bld: 201 mg/dL — ABNORMAL HIGH (ref 70–99)
Potassium: 4.3 mmol/L (ref 3.5–5.1)
Sodium: 134 mmol/L — ABNORMAL LOW (ref 135–145)
Total Bilirubin: 0.1 mg/dL — ABNORMAL LOW (ref 0.3–1.2)
Total Protein: 7.3 g/dL (ref 6.5–8.1)

## 2019-05-08 LAB — I-STAT BETA HCG BLOOD, ED (MC, WL, AP ONLY): I-stat hCG, quantitative: <5 m[IU]/mL (ref <5)

## 2019-05-08 LAB — LIPASE, BLOOD: Lipase: 24 U/L (ref 11–51)

## 2019-05-08 MED ORDER — ONDANSETRON 4 MG PO TBDP
4.0000 mg | ORAL_TABLET | Freq: Once | ORAL | Status: AC | PRN
  Administered 2019-05-08: 4 mg via ORAL
  Filled 2019-05-08: qty 1

## 2019-05-08 MED ORDER — SODIUM CHLORIDE 0.9% FLUSH
3.0000 mL | Freq: Once | INTRAVENOUS | Status: AC
  Administered 2019-05-09: 05:00:00 3 mL via INTRAVENOUS

## 2019-05-08 NOTE — ED Triage Notes (Signed)
Patient arrived stating that on Sunday she began having vomiting and diarrhea. States she has been seen by her primary care who prescribed Zofran with some relief. States tonight her symptoms worsened.

## 2019-05-09 ENCOUNTER — Encounter (HOSPITAL_COMMUNITY): Payer: Self-pay

## 2019-05-09 ENCOUNTER — Emergency Department (HOSPITAL_COMMUNITY): Payer: Medicare Other | Admitting: Anesthesiology

## 2019-05-09 ENCOUNTER — Inpatient Hospital Stay (HOSPITAL_COMMUNITY)
Admission: EM | Admit: 2019-05-09 | Discharge: 2019-05-12 | DRG: 418 | Disposition: A | Discharge status: Home / Self Care | Payer: Medicare Other | Attending: Physician Assistant | Admitting: Physician Assistant

## 2019-05-09 ENCOUNTER — Emergency Department (HOSPITAL_COMMUNITY): Payer: Medicare Other

## 2019-05-09 ENCOUNTER — Encounter (HOSPITAL_COMMUNITY): Admission: EM | Disposition: A | Discharge status: Home / Self Care | Payer: Self-pay | Source: Home / Self Care

## 2019-05-09 DIAGNOSIS — K660 Peritoneal adhesions (postprocedural) (postinfection): Secondary | ICD-10-CM | POA: Diagnosis present

## 2019-05-09 DIAGNOSIS — K82A1 Gangrene of gallbladder in cholecystitis: Secondary | ICD-10-CM | POA: Diagnosis present

## 2019-05-09 DIAGNOSIS — Z8249 Family history of ischemic heart disease and other diseases of the circulatory system: Secondary | ICD-10-CM | POA: Diagnosis not present

## 2019-05-09 DIAGNOSIS — N179 Acute kidney failure, unspecified: Secondary | ICD-10-CM | POA: Diagnosis not present

## 2019-05-09 DIAGNOSIS — M797 Fibromyalgia: Secondary | ICD-10-CM | POA: Diagnosis present

## 2019-05-09 DIAGNOSIS — E039 Hypothyroidism, unspecified: Secondary | ICD-10-CM | POA: Diagnosis present

## 2019-05-09 DIAGNOSIS — Z885 Allergy status to narcotic agent status: Secondary | ICD-10-CM | POA: Diagnosis not present

## 2019-05-09 DIAGNOSIS — Z9884 Bariatric surgery status: Secondary | ICD-10-CM | POA: Diagnosis not present

## 2019-05-09 DIAGNOSIS — K5900 Constipation, unspecified: Secondary | ICD-10-CM | POA: Diagnosis not present

## 2019-05-09 DIAGNOSIS — E1165 Type 2 diabetes mellitus with hyperglycemia: Secondary | ICD-10-CM | POA: Diagnosis not present

## 2019-05-09 DIAGNOSIS — Z794 Long term (current) use of insulin: Secondary | ICD-10-CM | POA: Diagnosis not present

## 2019-05-09 DIAGNOSIS — Z79899 Other long term (current) drug therapy: Secondary | ICD-10-CM | POA: Diagnosis not present

## 2019-05-09 DIAGNOSIS — E119 Type 2 diabetes mellitus without complications: Secondary | ICD-10-CM | POA: Diagnosis present

## 2019-05-09 DIAGNOSIS — K819 Cholecystitis, unspecified: Secondary | ICD-10-CM

## 2019-05-09 DIAGNOSIS — Z6841 Body Mass Index (BMI) 40.0 and over, adult: Secondary | ICD-10-CM | POA: Diagnosis not present

## 2019-05-09 DIAGNOSIS — Z20822 Contact with and (suspected) exposure to covid-19: Secondary | ICD-10-CM | POA: Diagnosis present

## 2019-05-09 DIAGNOSIS — Z79890 Hormone replacement therapy (postmenopausal): Secondary | ICD-10-CM | POA: Diagnosis not present

## 2019-05-09 DIAGNOSIS — K8012 Calculus of gallbladder with acute and chronic cholecystitis without obstruction: Secondary | ICD-10-CM | POA: Diagnosis present

## 2019-05-09 DIAGNOSIS — I10 Essential (primary) hypertension: Secondary | ICD-10-CM | POA: Diagnosis present

## 2019-05-09 DIAGNOSIS — G43909 Migraine, unspecified, not intractable, without status migrainosus: Secondary | ICD-10-CM | POA: Diagnosis present

## 2019-05-09 DIAGNOSIS — D62 Acute posthemorrhagic anemia: Secondary | ICD-10-CM | POA: Diagnosis not present

## 2019-05-09 DIAGNOSIS — Z833 Family history of diabetes mellitus: Secondary | ICD-10-CM | POA: Diagnosis not present

## 2019-05-09 DIAGNOSIS — E785 Hyperlipidemia, unspecified: Secondary | ICD-10-CM | POA: Diagnosis present

## 2019-05-09 DIAGNOSIS — K8000 Calculus of gallbladder with acute cholecystitis without obstruction: Secondary | ICD-10-CM

## 2019-05-09 DIAGNOSIS — K7581 Nonalcoholic steatohepatitis (NASH): Secondary | ICD-10-CM | POA: Diagnosis present

## 2019-05-09 DIAGNOSIS — K8020 Calculus of gallbladder without cholecystitis without obstruction: Secondary | ICD-10-CM

## 2019-05-09 HISTORY — PX: CHOLECYSTECTOMY: SHX55

## 2019-05-09 LAB — GLUCOSE, CAPILLARY
Glucose-Capillary: 338 mg/dL — ABNORMAL HIGH (ref 70–99)
Glucose-Capillary: 325 mg/dL — ABNORMAL HIGH (ref 70–99)
Glucose-Capillary: 244 mg/dL — ABNORMAL HIGH (ref 70–99)
Glucose-Capillary: 300 mg/dL — ABNORMAL HIGH (ref 70–99)

## 2019-05-09 LAB — RESPIRATORY PANEL BY RT PCR (FLU A&B, COVID)
Influenza A by PCR: NEGATIVE
Influenza B by PCR: NEGATIVE
SARS Coronavirus 2 by RT PCR: NEGATIVE

## 2019-05-09 LAB — CBG MONITORING, ED: Glucose-Capillary: 290 mg/dL — ABNORMAL HIGH (ref 70–99)

## 2019-05-09 SURGERY — CHOLECYSTECTOMY
Anesthesia: General | Site: Abdomen

## 2019-05-09 MED ORDER — ATORVASTATIN CALCIUM 10 MG PO TABS
10.0000 mg | ORAL_TABLET | Freq: Every day | ORAL | Status: DC
  Administered 2019-05-10 – 2019-05-12 (×3): 10 mg via ORAL
  Filled 2019-05-09 (×3): qty 1

## 2019-05-09 MED ORDER — FENTANYL CITRATE (PF) 100 MCG/2ML IJ SOLN
INTRAMUSCULAR | Status: AC
  Filled 2019-05-09: qty 2

## 2019-05-09 MED ORDER — LIP MEDEX EX OINT
TOPICAL_OINTMENT | CUTANEOUS | Status: AC
  Filled 2019-05-09: qty 7

## 2019-05-09 MED ORDER — FENTANYL CITRATE (PF) 100 MCG/2ML IJ SOLN
INTRAMUSCULAR | Status: DC | PRN
  Administered 2019-05-09: 100 ug via INTRAVENOUS

## 2019-05-09 MED ORDER — FENTANYL CITRATE (PF) 100 MCG/2ML IJ SOLN
100.0000 ug | Freq: Once | INTRAMUSCULAR | Status: AC
  Administered 2019-05-09: 05:00:00 100 ug via INTRAVENOUS
  Filled 2019-05-09: qty 2

## 2019-05-09 MED ORDER — LACTATED RINGERS IV SOLN: INTRAVENOUS | Status: DC | PRN

## 2019-05-09 MED ORDER — ONDANSETRON HCL 4 MG/2ML IJ SOLN
4.0000 mg | Freq: Once | INTRAMUSCULAR | Status: AC
  Administered 2019-05-09: 02:00:00 4 mg via INTRAVENOUS
  Filled 2019-05-09: qty 2

## 2019-05-09 MED ORDER — BUPROPION HCL ER (XL) 300 MG PO TB24
300.0000 mg | ORAL_TABLET | Freq: Every day | ORAL | Status: DC
  Administered 2019-05-09 – 2019-05-12 (×4): 300 mg via ORAL
  Filled 2019-05-09 (×4): qty 1

## 2019-05-09 MED ORDER — BUPIVACAINE LIPOSOME 1.3 % IJ SUSP
INTRAMUSCULAR | Status: DC | PRN
  Administered 2019-05-09: 20 mL

## 2019-05-09 MED ORDER — IOHEXOL 300 MG/ML  SOLN
100.0000 mL | Freq: Once | INTRAMUSCULAR | Status: AC | PRN
  Administered 2019-05-09: 100 mL via INTRAVENOUS

## 2019-05-09 MED ORDER — PHENYLEPHRINE 40 MCG/ML (10ML) SYRINGE FOR IV PUSH (FOR BLOOD PRESSURE SUPPORT)
PREFILLED_SYRINGE | INTRAVENOUS | Status: AC
  Filled 2019-05-09: qty 10

## 2019-05-09 MED ORDER — MIDAZOLAM HCL 2 MG/2ML IJ SOLN
INTRAMUSCULAR | Status: AC
  Filled 2019-05-09: qty 2

## 2019-05-09 MED ORDER — ONDANSETRON HCL 4 MG/2ML IJ SOLN
INTRAMUSCULAR | Status: DC | PRN
  Administered 2019-05-09: 4 mg via INTRAVENOUS

## 2019-05-09 MED ORDER — SODIUM CHLORIDE 0.9 % IV SOLN
8.0000 mg | Freq: Four times a day (QID) | INTRAVENOUS | Status: DC | PRN
  Filled 2019-05-09: qty 4

## 2019-05-09 MED ORDER — SODIUM CHLORIDE 0.9 % IV BOLUS
1000.0000 mL | Freq: Once | INTRAVENOUS | Status: AC
  Administered 2019-05-09: 02:00:00 1000 mL via INTRAVENOUS

## 2019-05-09 MED ORDER — ONDANSETRON HCL 4 MG/2ML IJ SOLN
INTRAMUSCULAR | Status: AC
  Filled 2019-05-09: qty 2

## 2019-05-09 MED ORDER — DIPHENOXYLATE-ATROPINE 2.5-0.025 MG PO TABS
2.0000 | ORAL_TABLET | Freq: Once | ORAL | Status: AC
  Administered 2019-05-09: 03:00:00 2 via ORAL
  Filled 2019-05-09: qty 2

## 2019-05-09 MED ORDER — LACTATED RINGERS IV BOLUS
1000.0000 mL | Freq: Three times a day (TID) | INTRAVENOUS | Status: AC | PRN
  Administered 2019-05-11: 1000 mL via INTRAVENOUS

## 2019-05-09 MED ORDER — ACETAMINOPHEN 650 MG RE SUPP: 650.0000 mg | Freq: Four times a day (QID) | RECTAL | Status: DC | PRN

## 2019-05-09 MED ORDER — ROCURONIUM BROMIDE 10 MG/ML (PF) SYRINGE
PREFILLED_SYRINGE | INTRAVENOUS | Status: DC | PRN
  Administered 2019-05-09: 20 mg via INTRAVENOUS
  Administered 2019-05-09: 40 mg via INTRAVENOUS

## 2019-05-09 MED ORDER — OXYCODONE HCL 5 MG/5ML PO SOLN: 5.0000 mg | Freq: Once | ORAL | Status: DC | PRN

## 2019-05-09 MED ORDER — LACTATED RINGERS IV SOLN
INTRAVENOUS | Status: AC | PRN
  Administered 2019-05-09: 3000 mL

## 2019-05-09 MED ORDER — METOCLOPRAMIDE HCL 5 MG/ML IJ SOLN
10.0000 mg | Freq: Once | INTRAMUSCULAR | Status: AC
  Administered 2019-05-09: 03:00:00 10 mg via INTRAVENOUS
  Filled 2019-05-09: qty 2

## 2019-05-09 MED ORDER — LIDOCAINE 2% (20 MG/ML) 5 ML SYRINGE
INTRAMUSCULAR | Status: DC | PRN
  Administered 2019-05-09: 100 mg via INTRAVENOUS

## 2019-05-09 MED ORDER — DIAZEPAM 5 MG PO TABS
5.0000 mg | ORAL_TABLET | Freq: Four times a day (QID) | ORAL | Status: DC | PRN
  Administered 2019-05-09: 5 mg via ORAL
  Filled 2019-05-09: qty 1

## 2019-05-09 MED ORDER — SODIUM CHLORIDE 0.9 % IV SOLN: INTRAVENOUS | Status: DC

## 2019-05-09 MED ORDER — BUPIVACAINE-EPINEPHRINE (PF) 0.5% -1:200000 IJ SOLN
INTRAMUSCULAR | Status: AC
  Filled 2019-05-09: qty 1.8

## 2019-05-09 MED ORDER — BUPIVACAINE LIPOSOME 1.3 % IJ SUSP
20.0000 mL | Freq: Once | INTRAMUSCULAR | Status: DC
  Filled 2019-05-09: qty 20

## 2019-05-09 MED ORDER — METOPROLOL SUCCINATE ER 25 MG PO TB24
25.0000 mg | ORAL_TABLET | Freq: Every day | ORAL | Status: DC
  Administered 2019-05-09 – 2019-05-12 (×4): 25 mg via ORAL
  Filled 2019-05-09 (×4): qty 1

## 2019-05-09 MED ORDER — SODIUM CHLORIDE (PF) 0.9 % IJ SOLN
INTRAMUSCULAR | Status: AC
  Filled 2019-05-09: qty 50

## 2019-05-09 MED ORDER — LIDOCAINE 2% (20 MG/ML) 5 ML SYRINGE
INTRAMUSCULAR | Status: AC
  Filled 2019-05-09: qty 5

## 2019-05-09 MED ORDER — PROMETHAZINE HCL 25 MG/ML IJ SOLN: 6.2500 mg | INTRAMUSCULAR | Status: DC | PRN

## 2019-05-09 MED ORDER — HYDROMORPHONE HCL 1 MG/ML IJ SOLN
0.5000 mg | INTRAMUSCULAR | Status: DC | PRN
  Administered 2019-05-09 – 2019-05-11 (×8): 1 mg via INTRAVENOUS
  Filled 2019-05-09 (×8): qty 1

## 2019-05-09 MED ORDER — PIPERACILLIN-TAZOBACTAM 3.375 G IVPB 30 MIN
3.3750 g | Freq: Once | INTRAVENOUS | Status: DC
  Filled 2019-05-09: qty 50

## 2019-05-09 MED ORDER — BUPIVACAINE-EPINEPHRINE 0.5% -1:200000 IJ SOLN
INTRAMUSCULAR | Status: DC | PRN
  Administered 2019-05-09: 30 mL

## 2019-05-09 MED ORDER — GUAIFENESIN-DM 100-10 MG/5ML PO SYRP: 10.0000 mL | ORAL_SOLUTION | ORAL | Status: DC | PRN

## 2019-05-09 MED ORDER — PIPERACILLIN-TAZOBACTAM 3.375 G IVPB
3.3750 g | Freq: Three times a day (TID) | INTRAVENOUS | Status: DC
  Administered 2019-05-09 – 2019-05-12 (×9): 3.375 g via INTRAVENOUS
  Filled 2019-05-09 (×9): qty 50

## 2019-05-09 MED ORDER — SODIUM CHLORIDE 0.9 % IV SOLN
2.0000 g | Freq: Once | INTRAVENOUS | Status: AC
  Administered 2019-05-09: 07:00:00 2 g via INTRAVENOUS
  Filled 2019-05-09: qty 20

## 2019-05-09 MED ORDER — DEXTROSE 5 % IV SOLN
3.0000 g | INTRAVENOUS | Status: AC
  Administered 2019-05-09: 3 g via INTRAVENOUS
  Filled 2019-05-09: qty 3

## 2019-05-09 MED ORDER — OXYCODONE HCL 5 MG PO TABS
10.0000 mg | ORAL_TABLET | ORAL | Status: DC | PRN
  Administered 2019-05-10 – 2019-05-11 (×2): 10 mg via ORAL
  Filled 2019-05-09 (×3): qty 2

## 2019-05-09 MED ORDER — ONDANSETRON HCL 4 MG/2ML IJ SOLN
4.0000 mg | Freq: Four times a day (QID) | INTRAMUSCULAR | Status: DC | PRN
  Administered 2019-05-12: 4 mg via INTRAVENOUS
  Filled 2019-05-09: qty 2

## 2019-05-09 MED ORDER — LISINOPRIL 5 MG PO TABS
5.0000 mg | ORAL_TABLET | Freq: Every day | ORAL | Status: DC
  Administered 2019-05-10: 5 mg via ORAL
  Filled 2019-05-09: qty 1

## 2019-05-09 MED ORDER — MIDAZOLAM HCL 5 MG/5ML IJ SOLN
INTRAMUSCULAR | Status: DC | PRN
  Administered 2019-05-09: 2 mg via INTRAVENOUS

## 2019-05-09 MED ORDER — HYDROCORTISONE 1 % EX CREA
1.0000 "application " | TOPICAL_CREAM | Freq: Three times a day (TID) | CUTANEOUS | Status: DC | PRN
  Filled 2019-05-09: qty 28

## 2019-05-09 MED ORDER — MAGIC MOUTHWASH
15.0000 mL | Freq: Four times a day (QID) | ORAL | Status: DC | PRN
  Filled 2019-05-09: qty 15

## 2019-05-09 MED ORDER — ACETAMINOPHEN 325 MG PO TABS: 325.0000 mg | ORAL_TABLET | Freq: Four times a day (QID) | ORAL | Status: DC | PRN

## 2019-05-09 MED ORDER — HYDROCORTISONE (PERIANAL) 2.5 % EX CREA
1.0000 "application " | TOPICAL_CREAM | Freq: Four times a day (QID) | CUTANEOUS | Status: DC | PRN
  Filled 2019-05-09: qty 28.35

## 2019-05-09 MED ORDER — OXYCODONE HCL 5 MG PO TABS: 5.0000 mg | ORAL_TABLET | Freq: Once | ORAL | Status: DC | PRN

## 2019-05-09 MED ORDER — SUGAMMADEX SODIUM 500 MG/5ML IV SOLN
INTRAVENOUS | Status: DC | PRN
  Administered 2019-05-09: 400 mg via INTRAVENOUS

## 2019-05-09 MED ORDER — CYCLOBENZAPRINE HCL 10 MG PO TABS
10.0000 mg | ORAL_TABLET | Freq: Two times a day (BID) | ORAL | Status: DC | PRN
  Administered 2019-05-10: 10 mg via ORAL
  Filled 2019-05-09: qty 1

## 2019-05-09 MED ORDER — MENTHOL 3 MG MT LOZG
1.0000 | LOZENGE | OROMUCOSAL | Status: DC | PRN
  Filled 2019-05-09: qty 9

## 2019-05-09 MED ORDER — PROCHLORPERAZINE EDISYLATE 10 MG/2ML IJ SOLN
5.0000 mg | INTRAMUSCULAR | Status: DC | PRN
  Administered 2019-05-12: 10 mg via INTRAVENOUS
  Filled 2019-05-09: qty 2

## 2019-05-09 MED ORDER — DEXAMETHASONE SODIUM PHOSPHATE 10 MG/ML IJ SOLN
INTRAMUSCULAR | Status: DC | PRN
  Administered 2019-05-09: 10 mg via INTRAVENOUS

## 2019-05-09 MED ORDER — DEXAMETHASONE SODIUM PHOSPHATE 10 MG/ML IJ SOLN
INTRAMUSCULAR | Status: AC
  Filled 2019-05-09: qty 1

## 2019-05-09 MED ORDER — 0.9 % SODIUM CHLORIDE (POUR BTL) OPTIME
TOPICAL | Status: DC | PRN
  Administered 2019-05-09: 1000 mL

## 2019-05-09 MED ORDER — CHLORHEXIDINE GLUCONATE CLOTH 2 % EX PADS: 6.0000 | MEDICATED_PAD | Freq: Once | CUTANEOUS | Status: DC

## 2019-05-09 MED ORDER — BUPIVACAINE-EPINEPHRINE (PF) 0.5% -1:200000 IJ SOLN
INTRAMUSCULAR | Status: AC
  Filled 2019-05-09: qty 30

## 2019-05-09 MED ORDER — BISACODYL 10 MG RE SUPP: 10.0000 mg | Freq: Two times a day (BID) | RECTAL | Status: DC | PRN

## 2019-05-09 MED ORDER — FUROSEMIDE 20 MG PO TABS
20.0000 mg | ORAL_TABLET | Freq: Two times a day (BID) | ORAL | Status: DC
  Administered 2019-05-09 – 2019-05-12 (×6): 20 mg via ORAL
  Filled 2019-05-09 (×6): qty 1

## 2019-05-09 MED ORDER — SUCCINYLCHOLINE CHLORIDE 200 MG/10ML IV SOSY
PREFILLED_SYRINGE | INTRAVENOUS | Status: DC | PRN
  Administered 2019-05-09: 160 mg via INTRAVENOUS

## 2019-05-09 MED ORDER — KETOROLAC TROMETHAMINE 30 MG/ML IJ SOLN: 30.0000 mg | Freq: Once | INTRAMUSCULAR | Status: DC | PRN

## 2019-05-09 MED ORDER — METRONIDAZOLE IN NACL 5-0.79 MG/ML-% IV SOLN
500.0000 mg | INTRAVENOUS | Status: AC
  Administered 2019-05-09: 500 mg via INTRAVENOUS
  Filled 2019-05-09: qty 100

## 2019-05-09 MED ORDER — PROPOFOL 10 MG/ML IV BOLUS
INTRAVENOUS | Status: AC
  Filled 2019-05-09: qty 20

## 2019-05-09 MED ORDER — PHENYLEPHRINE 40 MCG/ML (10ML) SYRINGE FOR IV PUSH (FOR BLOOD PRESSURE SUPPORT)
PREFILLED_SYRINGE | INTRAVENOUS | Status: DC | PRN
  Administered 2019-05-09 (×6): 80 ug via INTRAVENOUS

## 2019-05-09 MED ORDER — LIP MEDEX EX OINT
1.0000 "application " | TOPICAL_OINTMENT | Freq: Two times a day (BID) | CUTANEOUS | Status: DC
  Administered 2019-05-09 – 2019-05-12 (×6): 1 via TOPICAL
  Filled 2019-05-09: qty 7

## 2019-05-09 MED ORDER — AMITRIPTYLINE HCL 50 MG PO TABS
50.0000 mg | ORAL_TABLET | Freq: Every day | ORAL | Status: DC
  Administered 2019-05-09: 50 mg via ORAL
  Filled 2019-05-09: qty 1

## 2019-05-09 MED ORDER — INSULIN ASPART 100 UNIT/ML ~~LOC~~ SOLN
SUBCUTANEOUS | Status: AC
  Filled 2019-05-09: qty 1

## 2019-05-09 MED ORDER — INSULIN ASPART 100 UNIT/ML ~~LOC~~ SOLN
0.0000 [IU] | Freq: Every day | SUBCUTANEOUS | Status: DC
  Administered 2019-05-09: 3 [IU] via SUBCUTANEOUS
  Administered 2019-05-11: 2 [IU] via SUBCUTANEOUS
  Filled 2019-05-09: qty 0.05

## 2019-05-09 MED ORDER — ACETAMINOPHEN 500 MG PO TABS: 1000.0000 mg | ORAL_TABLET | ORAL | Status: DC

## 2019-05-09 MED ORDER — ALUM & MAG HYDROXIDE-SIMETH 200-200-20 MG/5ML PO SUSP
30.0000 mL | Freq: Four times a day (QID) | ORAL | Status: DC | PRN
  Administered 2019-05-09 – 2019-05-10 (×2): 30 mL via ORAL
  Filled 2019-05-09 (×2): qty 30

## 2019-05-09 MED ORDER — FENTANYL CITRATE (PF) 100 MCG/2ML IJ SOLN
25.0000 ug | INTRAMUSCULAR | Status: DC | PRN
  Administered 2019-05-09 (×2): 50 ug via INTRAVENOUS

## 2019-05-09 MED ORDER — PHENOL 1.4 % MT LIQD
1.0000 | OROMUCOSAL | Status: DC | PRN
  Filled 2019-05-09: qty 177

## 2019-05-09 MED ORDER — LEVOTHYROXINE SODIUM 75 MCG PO TABS
150.0000 ug | ORAL_TABLET | Freq: Every day | ORAL | Status: DC
  Administered 2019-05-10 – 2019-05-12 (×3): 150 ug via ORAL
  Filled 2019-05-09 (×3): qty 2

## 2019-05-09 MED ORDER — GABAPENTIN 300 MG PO CAPS: 300.0000 mg | ORAL_CAPSULE | ORAL | Status: DC

## 2019-05-09 MED ORDER — INSULIN ASPART 100 UNIT/ML ~~LOC~~ SOLN
0.0000 [IU] | Freq: Three times a day (TID) | SUBCUTANEOUS | Status: DC
  Administered 2019-05-09: 15 [IU] via SUBCUTANEOUS
  Administered 2019-05-09: 7 [IU] via SUBCUTANEOUS
  Administered 2019-05-10: 11 [IU] via SUBCUTANEOUS
  Administered 2019-05-10: 15 [IU] via SUBCUTANEOUS
  Administered 2019-05-11 – 2019-05-12 (×4): 7 [IU] via SUBCUTANEOUS
  Administered 2019-05-12: 11 [IU] via SUBCUTANEOUS
  Filled 2019-05-09: qty 0.2

## 2019-05-09 MED ORDER — METHOCARBAMOL 1000 MG/10ML IJ SOLN
1000.0000 mg | Freq: Four times a day (QID) | INTRAVENOUS | Status: DC | PRN
  Administered 2019-05-11: 1000 mg via INTRAVENOUS
  Filled 2019-05-09: qty 1000
  Filled 2019-05-09: qty 10

## 2019-05-09 MED ORDER — ROCURONIUM BROMIDE 10 MG/ML (PF) SYRINGE
PREFILLED_SYRINGE | INTRAVENOUS | Status: AC
  Filled 2019-05-09: qty 10

## 2019-05-09 MED ORDER — PROPOFOL 10 MG/ML IV BOLUS
INTRAVENOUS | Status: DC | PRN
  Administered 2019-05-09: 200 mg via INTRAVENOUS

## 2019-05-09 MED ORDER — DULOXETINE HCL 60 MG PO CPEP
60.0000 mg | ORAL_CAPSULE | Freq: Two times a day (BID) | ORAL | Status: DC
  Administered 2019-05-09 – 2019-05-12 (×6): 60 mg via ORAL
  Filled 2019-05-09 (×6): qty 1

## 2019-05-09 MED ORDER — CLONAZEPAM 1 MG PO TABS: 1.0000 mg | ORAL_TABLET | Freq: Every day | ORAL | Status: DC | PRN

## 2019-05-09 MED ORDER — SUCCINYLCHOLINE CHLORIDE 200 MG/10ML IV SOSY
PREFILLED_SYRINGE | INTRAVENOUS | Status: AC
  Filled 2019-05-09: qty 10

## 2019-05-09 MED ORDER — SUGAMMADEX SODIUM 500 MG/5ML IV SOLN
INTRAVENOUS | Status: AC
  Filled 2019-05-09: qty 5

## 2019-05-09 SURGICAL SUPPLY — 33 items
APPLIER CLIP 5 13 M/L LIGAMAX5 (MISCELLANEOUS) ×2
CABLE HIGH FREQUENCY MONO STRZ (ELECTRODE) ×2 IMPLANT
CHLORAPREP W/TINT 26 (MISCELLANEOUS) ×4 IMPLANT
CLIP APPLIE 5 13 M/L LIGAMAX5 (MISCELLANEOUS) ×1 IMPLANT
COVER MAYO STAND STRL (DRAPES) ×2 IMPLANT
DECANTER SPIKE VIAL GLASS SM (MISCELLANEOUS) ×2 IMPLANT
DRAIN CHANNEL 19F RND (DRAIN) ×2 IMPLANT
ELECT REM PT RETURN 15FT ADLT (MISCELLANEOUS) ×2 IMPLANT
ENDOLOOP SUT PDS II  0 18 (SUTURE) ×2
ENDOLOOP SUT PDS II 0 18 (SUTURE) ×2 IMPLANT
EVACUATOR SILICONE 100CC (DRAIN) ×2 IMPLANT
GLOVE ECLIPSE 8.0 STRL XLNG CF (GLOVE) ×2 IMPLANT
GLOVE INDICATOR 8.0 STRL GRN (GLOVE) ×2 IMPLANT
GRASPER SUT TROCAR 14GX15 (MISCELLANEOUS) ×2 IMPLANT
IRRIG SUCT STRYKERFLOW 2 WTIP (MISCELLANEOUS) ×2
IRRIGATION SUCT STRKRFLW 2 WTP (MISCELLANEOUS) ×1 IMPLANT
KIT BASIN OR (CUSTOM PROCEDURE TRAY) ×2 IMPLANT
NEEDLE INSUFFLATION 14GA 150MM (NEEDLE) ×2 IMPLANT
PAD POSITIONING PINK XL (MISCELLANEOUS) ×2 IMPLANT
PENCIL SMOKE EVACUATOR (MISCELLANEOUS) ×2 IMPLANT
POUCH RETRIEVAL ECOSAC 10 (ENDOMECHANICALS) ×1 IMPLANT
POUCH RETRIEVAL ECOSAC 10MM (ENDOMECHANICALS) ×1
SCISSORS LAP 5X35 DISP (ENDOMECHANICALS) ×2 IMPLANT
SET CHOLANGIOGRAPH MIX (MISCELLANEOUS) ×2 IMPLANT
SET TUBE SMOKE EVAC HIGH FLOW (TUBING) ×2 IMPLANT
SUT MNCRL AB 4-0 PS2 18 (SUTURE) ×2 IMPLANT
SUT PROLENE 2 0 SH DA (SUTURE) ×2 IMPLANT
SYR 20ML LL LF (SYRINGE) ×2 IMPLANT
TOWEL OR 17X26 10 PK STRL BLUE (TOWEL DISPOSABLE) ×2 IMPLANT
TOWEL OR NON WOVEN STRL DISP B (DISPOSABLE) ×2 IMPLANT
TRAY LAPAROSCOPIC (CUSTOM PROCEDURE TRAY) ×2 IMPLANT
TROCAR BLADELESS OPT 5 100 (ENDOMECHANICALS) ×6 IMPLANT
TROCAR BLADELESS OPT 5 150 (ENDOMECHANICALS) ×2 IMPLANT

## 2019-05-09 NOTE — H&P (Signed)
Summer Lopez  10/21/1970 [id number]  CARE TEAM:  PCP: Berkley Harvey, NP  Outpatient Care Team: Patient Care Team: Berkley Harvey, NP as PCP - General (Nurse Practitioner) Earnie Larsson, MD as Consulting Physician (Neurosurgery)  Inpatient Treatment Team: Treatment Team: Attending Provider: Orpah Greek, MD; Registered Nurse: Marlene Bast, RN; Consulting Physician: Edison Pace, Md, MD   This patient is a 49 y.o.female who presents today for surgical evaluation at the request of Dr Betsey Holiday, [facility] ED.   Chief complaint / Reason for evaluation: Recurrent abdominal pain.  Possible cholecystitis  Morbidly obese woman with history of prior bariatric surgery.  Records not available but it seems like she had a gastric stapling/banding done several decades ago that required revision to a Roux-en-Y gastric bypass around the year 2000.  Most this done at [facility].  BMI from the 60s down to now the 40s.  History of diabetes.  Insulin requiring.  Had a periumbilical hernia repaired done in [place] over a decade ago.  Back surgery.  Patient is noted a 1 week history of intermittent crampy abdominal pain and nausea and retching.  Was severe last weekend.  Seemed to improve but then she had another attack.  Worst ever.  Constant pain in the upper abdomen, especially the right upper side.  Work-up suspicious and CT scan very suspicious for cholecystitis.  Surgical consultation requested.  Patient denies any history of irritable bowel or inflammatory bowel disease.  No history of any liver or pancreas problems.  No major cardiac or pulmonary issues.  She underwent back surgery in 2019 without much difficulty.   Assessment  Summer Lopez  49 y.o. female       Problem List:  Principal Problem:   Acute calculous cholecystitis Active Problems:   Poorly controlled diabetes mellitus ([place])   Morbid obesity ([place])   Hypothyroidism   Insulin-requiring or dependent type II  diabetes mellitus ([place])   Essential hypertension   Hx of laparoscopic gastric banding   History of Roux-en-Y gastric bypass   History physical and CT scan suspicious for cholecystitis and woman with a prior bariatric surgeries including gastric bypass 20 years ago  Plan:  Admission  IV fluid rehydration.  Antibiotics.  Nausea and pain control.  Laparoscopic cholecystectomy this admission.  Given her obesity and prior surgeries and possible mesh near her umbilicus, she is not a good candidate for a periumbilical single site approach.  They are due for do more standard 4 port approach.  She is very interested in proceeding with this soon as possible.  The anatomy & physiology of hepatobiliary & pancreatic function was discussed.  The pathophysiology of gallbladder dysfunction was discussed.  Natural history risks without surgery was discussed.   I feel the risks of no intervention will lead to serious problems that outweigh the operative risks; therefore, I recommended cholecystectomy to remove the pathology.  I explained laparoscopic techniques with possible need for an open approach.  Probable cholangiogram to evaluate the bilary tract was explained as well.    Risks such as bleeding, infection, diarrhea and other bowel changes, abscess, leak, injury to other organs, need for repair of tissues / organs, need for further treatment, stroke, heart attack, death, and other risks were discussed.  I noted a good likelihood this will help address the problem, but there is a chance it may not help.  Possibility that this will not correct all abdominal symptoms was explained.  Goals of post-operative  recovery were discussed as well.  We will work to minimize complications.  An educational handout further explaining the pathology and treatment options was given as well.  Questions were answered.  The patient expresses understanding & wishes to proceed with surgery.  Diabetic control.  Last A1c was 12 2  years ago.  Repeat.  Current glucose above 200.  Low threshold to consult medicine for further management postoperatively if needed.  Hypertension control.  Hypothyroidism control  -VTE prophylaxis- SCDs, etc -mobilize as tolerated to help recovery  45 minutes spent in review, evaluation, examination, counseling, and coordination of care.  More than 50% of that time was spent in counseling.  Adin Hector, MD, FACS, MASCRS Gastrointestinal and Minimally Invasive Surgery  [facility] [address] [place], [place] [address] [phone] Main / Paging [phone] Fax     05/09/2019      Past Medical History:  Diagnosis Date  . Depression   . Diabetes mellitus   . Enlarged heart    denies  . Hypertension   . Migraine   . Thyroid disease     Past Surgical History:  Procedure Laterality Date  . ABDOMINAL HYSTERECTOMY    . GASTRIC BYPASS    . HERNIA REPAIR     umbilical  . KNEE SURGERY  2012  . LUMBAR LAMINECTOMY  08/03/2017   Dr Annette Stable    Social History   Socioeconomic History  . Marital status: Married    Spouse name: Not on file  . Number of children: Not on file  . Years of education: college  . Highest education level: Not on file  Occupational History    Employer: UNEMPLOYED  Tobacco Use  . Smoking status: Never Smoker  . Smokeless tobacco: Never Used  Substance and Sexual Activity  . Alcohol use: No  . Drug use: No  . Sexual activity: Yes    Birth control/protection: None  Other Topics Concern  . Not on file  Social History Narrative  . Not on file   Social Determinants of Health   Financial Resource Strain:   . Difficulty of Paying Living Expenses:   Food Insecurity:   . Worried About Charity fundraiser in the Last Year:   . Arboriculturist in the Last Year:   Transportation Needs:   . Film/video editor (Medical):   Marland Kitchen Lack of Transportation (Non-Medical):   Physical Activity:   . Days of  Exercise per Week:   . Minutes of Exercise per Session:   Stress:   . Feeling of Stress :   Social Connections:   . Frequency of Communication with Friends and Family:   . Frequency of Social Gatherings with Friends and Family:   . Attends Religious Services:   . Active Member of Clubs or Organizations:   . Attends Archivist Meetings:   Marland Kitchen Marital Status:   Intimate Partner Violence:   . Fear of Current or Ex-Partner:   . Emotionally Abused:   Marland Kitchen Physically Abused:   . Sexually Abused:     Family History  Problem Relation Age of Onset  . Heart disease Other   . Hypertension Mother   . Diabetes Father   . Hypertension Father   . Arthritis Other   . Breast cancer Neg Hx     Current Facility-Administered Medications  Medication Dose Route Frequency Provider Last Rate Last Admin  . cefTRIAXone (ROCEPHIN) 2 g in sodium chloride 0.9 %  100 mL IVPB  2 g Intravenous Once Pollina, Gwenyth Allegra, MD       Current Outpatient Medications  Medication Sig Dispense Refill  . ACCU-CHEK SOFTCLIX LANCETS lancets Use lancets to check blood sugar tid    . amitriptyline (ELAVIL) 50 MG tablet Take 50 mg by mouth at bedtime.     Marland Kitchen atorvastatin (LIPITOR) 10 MG tablet Take 10 mg by mouth daily.  0  . Blood Glucose Monitoring Suppl (RA BLOOD GLUCOSE MONITOR) DEVI by Does not apply route.    Marland Kitchen buPROPion (WELLBUTRIN XL) 300 MG 24 hr tablet Take 300 mg by mouth daily.    . clonazePAM (KLONOPIN) 1 MG tablet Take 1-2 mg by mouth daily as needed for anxiety.    . clotrimazole (LOTRIMIN) 1 % cream Apply 1 application topically daily.     . cyclobenzaprine (FLEXERIL) 10 MG tablet Take 10 mg by mouth 2 (two) times daily as needed for muscle spasms.    . diazepam (VALIUM) 5 MG tablet Take 1-2 tablets (5-10 mg total) by mouth every 6 (six) hours as needed for muscle spasms. 30 tablet 0  . DULoxetine (CYMBALTA) 60 MG capsule Take 60 mg by mouth 2 (two) times daily.     . furosemide (LASIX) 20 MG tablet  Take 20 mg by mouth 2 (two) times daily.  11  . HYDROcodone-acetaminophen (NORCO) 10-325 MG per tablet Take 1 tablet by mouth every 6 (six) hours as needed for moderate pain.     Marland Kitchen insulin regular (NOVOLIN R,HUMULIN R) 100 units/mL injection Inject 30 Units into the skin 2 (two) times daily before a meal.     . Insulin Syringe-Needle U-100 (INSULIN SYRINGE 1CC/31GX5/16") 31G X 5/16" 1 ML MISC by Does not apply route.    Javier Docker Oil (OMEGA-3) 500 MG CAPS Take 500 mg by mouth daily.    Marland Kitchen levothyroxine (SYNTHROID, LEVOTHROID) 150 MCG tablet Take 150 mcg by mouth daily.     Marland Kitchen lisinopril (PRINIVIL,ZESTRIL) 5 MG tablet Take 5 mg by mouth daily.    . metoprolol succinate (TOPROL-XL) 25 MG 24 hr tablet Take 25 mg by mouth daily.    Marland Kitchen oxyCODONE 10 MG TABS Take 1 tablet (10 mg total) by mouth every 3 (three) hours as needed for severe pain ((score 7 to 10)). 50 tablet 0     Allergies  Allergen Reactions  . Meloxicam Other (See Comments)    GI Upset-Ulcers  . Adhesive [Tape] Rash and Other (See Comments)    Prefers paper tape, adhesive tape burns her skin  . Morphine Nausea And Vomiting    ROS:   All other systems reviewed & are negative except per HPI or as noted below: Constitutional:  No fevers, chills, sweats.  Weight stable Eyes:  No vision changes, No discharge HENT:  No sore throats, nasal drainage Lymph: No neck swelling, No bruising easily Pulmonary:  No cough, productive sputum CV: No orthopnea, PND  Patient walks 15 minutes for about 1/2 miles without difficulty.  No exertional chest/neck/shoulder/arm pain. GI:  No personal nor family history of GI/colon cancer, inflammatory bowel disease, irritable bowel syndrome, allergy such as Celiac Sprue, dietary/dairy problems, colitis, ulcers nor gastritis.  No recent sick contacts/gastroenteritis.  No travel outside the country.  No changes in diet. Renal: No UTIs, No hematuria Genital:  No drainage, bleeding, masses Musculoskeletal: No severe  joint pain.  Good ROM major joints Skin:  No sores or lesions.  No rashes Heme/Lymph:  No easy bleeding.  No swollen lymph nodes Neuro: No focal weakness/numbness.  No seizures Psych: No suicidal ideation.  No hallucinations  BP (!) 137/95 (BP Location: Right Arm)   Pulse 99   Temp 98.3 F (36.8 C) (Oral)   Resp 17   SpO2 100%   Physical Exam: Constitutional: Not cachectic.  Hygeine adequate.  Vitals signs as above.  Lying still.  Obviously uncomfortable.  Feeling sickly but not frankly toxic. Eyes: Pupils reactive, normal extraocular movements. Sclera nonicteric Neuro: CN II-XII intact.  No major focal sensory defects.  No major motor deficits. Lymph: No head/neck/groin lymphadenopathy Psych:  No severe agitation.  No severe anxiety.  Judgment & insight Adequate, Oriented x4 HENT: Normocephalic, Mucus membranes moist.  No thrush.   Neck: Supple, No tracheal deviation.  No obvious thyromegaly Chest: No pain to chest wall compression.  Good respiratory excursion.  No audible wheezing CV:  Pulses intact.  Regular rhythm.  No major extremity edema Abdomen: Soft, Nondistended.  Morbidly obese with large panniculus but soft.  Sharp tenderness in right upper quadrant suspicious for Murphy sign.  Mild epigastric discomfort.  Lower abdomen and left side not particularly tender.  No major guarding or peritonitis.   No hepatomegaly.  No splenomegaly Gen:  No inguinal hernias.  No inguinal lymphadenopathy.   Ext: No obvious deformity or contracture no significant edema.  No cyanosis Skin: No major subcutaneous nodules.  Warm and dry Musculoskeletal: Severe joint rigidity not present.  No obvious clubbing.  No digital petechiae.     Results:   Labs: Results for orders placed or performed during the hospital encounter of 05/09/19 (from the past 48 hour(s))  Lipase, blood     Status: None   Collection Time: 05/08/19 11:18 PM  Result Value Ref Range   Lipase 24 11 - 51 U/L    Comment:  Performed at [facility], [place] [address] [place], [place] [address]  Comprehensive metabolic panel     Status: Abnormal   Collection Time: 05/08/19 11:18 PM  Result Value Ref Range   Sodium 134 (L) 135 - 145 mmol/L   Potassium 4.3 3.5 - 5.1 mmol/L   Chloride 99 98 - 111 mmol/L   CO2 24 22 - 32 mmol/L   Glucose, Bld 201 (H) 70 - 99 mg/dL    Comment: Glucose reference range applies only to samples taken after fasting for at least 8 hours.   BUN 11 6 - 20 mg/dL   Creatinine, Ser 0.72 0.44 - 1.00 mg/dL   Calcium 8.9 8.9 - 10.3 mg/dL   Total Protein 7.3 6.5 - 8.1 g/dL   Albumin 3.4 (L) 3.5 - 5.0 g/dL   AST 12 (L) 15 - 41 U/L   ALT 11 0 - 44 U/L   Alkaline Phosphatase 69 38 - 126 U/L   Total Bilirubin 0.1 (L) 0.3 - 1.2 mg/dL   GFR calc non Af Amer >60 >60 mL/min   GFR calc Af Amer >60 >60 mL/min   Anion gap 11 5 - 15    Comment: Performed at [facility], [place] [address], [place], [place] [address]  CBC     Status: Abnormal   Collection Time: 05/08/19 11:18 PM  Result Value Ref Range   WBC 10.6 (H) 4.0 - 10.5 K/uL   RBC 4.00 3.87 - 5.11 MIL/uL   Hemoglobin 11.3 (L) 12.0 - 15.0 g/dL   HCT 36.6 36.0 - 46.0 %   MCV 91.5 80.0 - 100.0 fL  MCH 28.3 26.0 - 34.0 pg   MCHC 30.9 30.0 - 36.0 g/dL   RDW 12.8 11.5 - 15.5 %   Platelets 352 150 - 400 K/uL   nRBC 0.0 0.0 - 0.2 %    Comment: Performed at [facility], [place] [address] [place], [place] [address]  I-Stat beta hCG blood, ED     Status: None   Collection Time: 05/08/19 11:31 PM  Result Value Ref Range   I-stat hCG, quantitative <5.0 <5 mIU/mL   Comment 3            Comment:   GEST. AGE      CONC.  (mIU/mL)   <=1 WEEK        5 - 50     2 WEEKS       50 - 500     3 WEEKS       100 - 10,000     4 WEEKS     1,000 - 30,000        FEMALE AND NON-PREGNANT FEMALE:     LESS THAN 5 mIU/mL     Imaging / Studies: CT ABDOMEN PELVIS W CONTRAST  Result Date: 05/09/2019 CLINICAL  DATA:  Unspecified nausea and vomiting.  Diarrhea. EXAM: CT ABDOMEN AND PELVIS WITH CONTRAST TECHNIQUE: Multidetector CT imaging of the abdomen and pelvis was performed using the standard protocol following bolus administration of intravenous contrast. CONTRAST:  122m OMNIPAQUE IOHEXOL 300 MG/ML  SOLN COMPARISON:  CT 11/10/2011 FINDINGS: Lower chest: Lung bases are clear. Hepatobiliary: No focal hepatic lesion. No intrahepatic duct dilatation. The gallbladder is distended to 5.5 cm. Gallbladder wall is thickened to 6 mm. There is low-density sludge versus non radiodense stone within the lumen of the gallbladder. This material measures up to 3 cm (image 33/2). Common bile duct is normal caliber. Pancreas: Pancreas is normal. No ductal dilatation. No pancreatic inflammation. Spleen: Normal spleen Adrenals/urinary tract: Adrenal glands and kidneys are normal. The ureters and bladder normal. Stomach/Bowel: Post gastric bypass surgery. Anatomy similar to 2013. No bowel obstruction. Small bowel normal. appendix normal. The colon and rectosigmoid colon are normal. Vascular/Lymphatic: Abdominal aorta is normal caliber. No periportal or retroperitoneal adenopathy. No pelvic adenopathy. Reproductive: Post hysterectomy Other: No free fluid. Musculoskeletal: No aggressive osseous lesion. IMPRESSION: 1. Distended gallbladder with thickened wall and gallbladder sludge or large stone. Findings are concerning for acute cholecystitis. 2. Normal appendix. 3. Post gastric bypass anatomy without complication. Electronically Signed   By: SSuzy BouchardM.D.   On: 05/09/2019 04:38    Medications / Allergies: per chart  Antibiotics: Anti-infectives (From admission, onward)   Start     Dose/Rate Route Frequency Ordered Stop   05/09/19 0600  cefTRIAXone (ROCEPHIN) 2 g in sodium chloride 0.9 % 100 mL IVPB     2 g 200 mL/hr over 30 Minutes Intravenous  Once 05/09/19 0550          Note: Portions of this report may have been  transcribed using voice recognition software. Every effort was made to ensure accuracy; however, inadvertent computerized transcription errors may be present.   Any transcriptional errors that result from this process are unintentional.    SAdin Hector MD, FACS, MASCRS Gastrointestinal and Minimally Invasive Surgery  [facility] [address] [place] [phone] / Paging [phone]     05/09/2019

## 2019-05-09 NOTE — ED Notes (Signed)
Gave pt PRN medication per husband request. Pt is A&O and in NAD

## 2019-05-09 NOTE — Anesthesia Preprocedure Evaluation (Addendum)
Anesthesia Evaluation  Patient identified by MRN, date of birth, ID band Patient awake    Reviewed: Allergy & Precautions, NPO status , Patient's Chart, lab work & pertinent test results  Airway Mallampati: II  TM Distance: >3 FB Neck ROM: Full    Dental no notable dental hx.    Pulmonary neg pulmonary ROS,    Pulmonary exam normal breath sounds clear to auscultation       Cardiovascular hypertension, Normal cardiovascular exam Rhythm:Regular Rate:Normal     Neuro/Psych negative neurological ROS  negative psych ROS   GI/Hepatic negative GI ROS, Neg liver ROS,   Endo/Other  diabetes, Insulin DependentHypothyroidism Morbid obesity  Renal/GU negative Renal ROS  negative genitourinary   Musculoskeletal negative musculoskeletal ROS (+)   Abdominal   Peds negative pediatric ROS (+)  Hematology negative hematology ROS (+)   Anesthesia Other Findings   Reproductive/Obstetrics negative OB ROS                             Anesthesia Physical Anesthesia Plan  ASA: III  Anesthesia Plan: General   Post-op Pain Management:    Induction: Intravenous and Rapid sequence  PONV Risk Score and Plan: 3 and Ondansetron, Dexamethasone and Treatment may vary due to age or medical condition  Airway Management Planned: Oral ETT  Additional Equipment:   Intra-op Plan:   Post-operative Plan: Extubation in OR  Informed Consent: I have reviewed the patients History and Physical, chart, labs and discussed the procedure including the risks, benefits and alternatives for the proposed anesthesia with the patient or authorized representative who has indicated his/her understanding and acceptance.     Dental advisory given  Plan Discussed with: CRNA and Surgeon  Anesthesia Plan Comments:        Anesthesia Quick Evaluation

## 2019-05-09 NOTE — ED Notes (Signed)
Attempted IV x2, unsuccessful

## 2019-05-09 NOTE — Anesthesia Postprocedure Evaluation (Signed)
Anesthesia Post Note  Patient: Summer Lopez  Procedure(s) Performed: LAPAROSCOPIC CHOLECYSTECTOMY. (N/A Abdomen)     Patient location during evaluation: PACU Anesthesia Type: General Level of consciousness: awake and alert Pain management: pain level controlled Vital Signs Assessment: post-procedure vital signs reviewed and stable Respiratory status: spontaneous breathing, nonlabored ventilation, respiratory function stable and patient connected to nasal cannula oxygen Cardiovascular status: blood pressure returned to baseline and stable Postop Assessment: no apparent nausea or vomiting Anesthetic complications: no    Last Vitals:  Vitals:   05/09/19 1525 05/09/19 1625  BP: (!) 143/80 135/82  Pulse: 100 (!) 108  Resp: 16 16  Temp: 36.7 C 36.5 C  SpO2: 98% 98%    Last Pain:  Vitals:   05/09/19 1625  TempSrc: Oral  PainSc: 9                  Aronda Burford S

## 2019-05-09 NOTE — ED Notes (Signed)
LR, RN from pacu called stating patient needed rapid covid test prior to 10 am surgery. Dr. Betsey Holiday and primary ED nurse notified

## 2019-05-09 NOTE — ED Provider Notes (Signed)
[facility] DEPT Provider Note   CSN: [id number] Arrival date & time: 05/08/19  2237     History Chief Complaint  Patient presents with  . Abdominal Pain  . Emesis    Summer Lopez is a 49 y.o. female.  Patient presents with complaints of abdominal pain, nausea, vomiting and diarrhea.  Symptoms began yesterday.  She called her doctor and was given Zofran, reports that it helped yesterday but tonight she started having increased vomiting and diarrhea again.  Pain is crampy on the right upper abdomen.  She has not had any fever.  No chest pain.        Past Medical History:  Diagnosis Date  . Depression   . Diabetes mellitus   . Enlarged heart    denies  . Hypertension   . Migraine   . Thyroid disease     Patient Active Problem List   Diagnosis Date Noted  . Acute calculous cholecystitis 05/09/2019  . History of laparoscopic adjustable gastric banding 05/09/2019  . History of removal of laparoscopic gastric banding device 05/09/2019  . 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 Degenerative spondylolisthesis 08/13/2017  . Anxiety 12/30/2015  . Type 2 diabetes mellitus with hyperglycemia ([place]) 04/21/2013  . 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 Morbid obesity ([place]) 03/06/2013  . Alopecia 12/22/2012  . Disorder of thyroid 11/26/2012  . Poorly controlled diabetes mellitus ([place]) 08/26/2012  . Accumulation of fluid in tissues 08/11/2012  . Essential hypertension 04/09/2012  . Post menopausal syndrome 04/09/2012  . Pain in joint, ankle and foot 12/17/2011  . 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 Posterior tibial tendon dysfunction 11/14/2011  . ARTHROSCOPY, LEFT KNEE, HX OF 03/13/2010  . Anemia 02/07/2010  . DYSPEPSIA 02/07/2010  . Other complications of other bariatric procedure 02/07/2010  . CONSTIPATION 02/07/2010  . RECTAL BLEEDING 02/07/2010  . OSTEOARTHRITIS, KNEE, LEFT 02/07/2010  .  Arthropathy 02/07/2010  . FIBROMYALGIA 02/07/2010  . HEADACHE, CHRONIC 02/07/2010  . NAUSEA AND VOMITING 02/07/2010  . LUQ PAIN 02/07/2010  . MEDIAL MENISCUS TEAR, LEFT 02/07/2010  . MRSA 02/02/2010  . Hypothyroidism 02/02/2010  . DIABETES MELLITUS, TYPE II 02/02/2010  . Vitamin D deficiency 02/02/2010  . OBESITY 02/02/2010  . DEPRESSION 02/02/2010  . Chronic pain disorder 02/02/2010  . GERD 02/02/2010  . Constipation 02/02/2010  . CLOSED FRACTURE OF ASTRAGALUS 01/17/2009  . BUCKET HANDLE TEAR OF LATERAL MENISCUS 12/01/2008  . TEAR LATERAL MENISCUS 12/01/2008  . CARPAL TUNNEL SYNDROME 09/30/2008  . DERANGEMENT OF ANTERIOR HORN OF LATERAL MENISCUS 07/29/2008  . TEAR M C L 05/03/2008  . H N P-LUMBAR 03/31/2008  . Low back pain 03/31/2008  . JOINT EFFUSION, RIGHT KNEE 01/12/2008  . KNEE PAIN 12/10/2007  . PLANTAR FACIITIS 08/19/2007    Past Surgical History:  Procedure Laterality Date  . ABDOMINAL HYSTERECTOMY    . GASTRIC BYPASS    . HERNIA REPAIR     umbilical  . KNEE SURGERY  2012  . LUMBAR LAMINECTOMY  08/03/2017   Dr Annette Stable     OB History    Gravida  4   Para  2   Term  2   Preterm      AB  2   Living  2     SAB  2   TAB      Ectopic      Multiple      Live Births  Family History  Problem Relation Age of Onset  . Heart disease Other   . Hypertension Mother   . Diabetes Father   . Hypertension Father   . Arthritis Other   . Breast cancer Neg Hx     Social History   Tobacco Use  . Smoking status: Never Smoker  . Smokeless tobacco: Never Used  Substance Use Topics  . Alcohol use: No  . Drug use: No    Home Medications Prior to Admission medications   Medication Sig Start Date End Date Taking? Authorizing Provider  ACCU-CHEK SOFTCLIX LANCETS lancets Use lancets to check blood sugar tid 04/09/14   [provider]  amitriptyline (ELAVIL) 50 MG tablet Take 50 mg by mouth at bedtime.     [provider]    atorvastatin (LIPITOR) 10 MG tablet Take 10 mg by mouth daily. 06/19/17   [provider]  Blood Glucose Monitoring Suppl (RA BLOOD GLUCOSE MONITOR) DEVI by Does not apply route. 04/09/14   [provider]  buPROPion (WELLBUTRIN XL) 300 MG 24 hr tablet Take 300 mg by mouth daily.    [provider]  clonazePAM (KLONOPIN) 1 MG tablet Take 1-2 mg by mouth daily as needed for anxiety.    [provider]  clotrimazole (LOTRIMIN) 1 % cream Apply 1 application topically daily.  05/19/12   [provider]  cyclobenzaprine (FLEXERIL) 10 MG tablet Take 10 mg by mouth 2 (two) times daily as needed for muscle spasms.    [provider]  diazepam (VALIUM) 5 MG tablet Take 1-2 tablets (5-10 mg total) by mouth every 6 (six) hours as needed for muscle spasms. 08/14/17   Earnie Larsson, MD  DULoxetine (CYMBALTA) 60 MG capsule Take 60 mg by mouth 2 (two) times daily.     [provider]  furosemide (LASIX) 20 MG tablet Take 20 mg by mouth 2 (two) times daily. 08/02/17   [provider]  HYDROcodone-acetaminophen (NORCO) 10-325 MG per tablet Take 1 tablet by mouth every 6 (six) hours as needed for moderate pain.     [provider]  insulin regular (NOVOLIN R,HUMULIN R) 100 units/mL injection Inject 30 Units into the skin 2 (two) times daily before a meal.     [provider]  Insulin Syringe-Needle U-100 (INSULIN SYRINGE 1CC/31GX5/16") 31G X 5/16" 1 ML MISC by Does not apply route. 03/29/14   [provider]  Javier Docker Oil (OMEGA-3) 500 MG CAPS Take 500 mg by mouth daily.    [provider]  levothyroxine (SYNTHROID, LEVOTHROID) 150 MCG tablet Take 150 mcg by mouth daily.     [provider]  lisinopril (PRINIVIL,ZESTRIL) 5 MG tablet Take 5 mg by mouth daily.    [provider]  metoprolol succinate (TOPROL-XL) 25 MG 24 hr tablet Take 25 mg by mouth daily.    [provider]  oxyCODONE 10 MG TABS  Take 1 tablet (10 mg total) by mouth every 3 (three) hours as needed for severe pain ((score 7 to 10)). 08/14/17   Earnie Larsson, MD    Allergies    Meloxicam, Adhesive [tape], and Morphine  Review of Systems   Review of Systems  Gastrointestinal: Positive for abdominal pain, diarrhea, nausea and vomiting.  All other systems reviewed and are negative.   Physical Exam Updated Vital Signs BP (!) 162/77   Pulse 79   Temp 98.3 F (36.8 C) (Oral)   Resp 18   SpO2 98%   Physical Exam Vitals  and nursing note reviewed.  Constitutional:      General: She is not in acute distress.    Appearance: Normal appearance. She is well-developed.  HENT:     Head: Normocephalic and atraumatic.     Right Ear: Hearing normal.     Left Ear: Hearing normal.     Nose: Nose normal.  Eyes:     Conjunctiva/sclera: Conjunctivae normal.     Pupils: Pupils are equal, round, and reactive to light.  Cardiovascular:     Rate and Rhythm: Regular rhythm.     Heart sounds: S1 normal and S2 normal. No murmur. No friction rub. No gallop.   Pulmonary:     Effort: Pulmonary effort is normal. No respiratory distress.     Breath sounds: Normal breath sounds.  Chest:     Chest wall: No tenderness.  Abdominal:     General: Bowel sounds are normal.     Palpations: Abdomen is soft.     Tenderness: There is abdominal tenderness in the right upper quadrant, epigastric area and left upper quadrant. There is no guarding or rebound. Negative signs include Murphy's sign and McBurney's sign.     Hernia: No hernia is present.  Musculoskeletal:        General: Normal range of motion.     Cervical back: Normal range of motion and neck supple.  Skin:    General: Skin is warm and dry.     Findings: No rash.  Neurological:     Mental Status: She is alert and oriented to person, place, and time.     GCS: GCS eye subscore is 4. GCS verbal subscore is 5. GCS motor subscore is 6.     Cranial Nerves: No cranial nerve deficit.      Sensory: No sensory deficit.     Coordination: Coordination normal.  Psychiatric:        Speech: Speech normal.        Behavior: Behavior normal.        Thought Content: Thought content normal.     ED Results / Procedures / Treatments   Labs (all labs ordered are listed, but only abnormal results are displayed) Labs Reviewed  COMPREHENSIVE METABOLIC PANEL - Abnormal; Notable for the following components:      Result Value   Sodium 134 (*)    Glucose, Bld 201 (*)    Albumin 3.4 (*)    AST 12 (*)    Total Bilirubin 0.1 (*)    All other components within normal limits  CBC - Abnormal; Notable for the following components:   WBC 10.6 (*)    Hemoglobin 11.3 (*)    All other components within normal limits  LIPASE, BLOOD  URINALYSIS, ROUTINE W REFLEX MICROSCOPIC  HEMOGLOBIN A1C  I-STAT BETA HCG BLOOD, ED ([facility], [facility], [facility] ONLY)    EKG EKG Interpretation  Date/Time:  Friday May 08 2019 22:51:11 EDT Ventricular Rate:  104 PR Interval:    QRS Duration: 83 QT Interval:  324 QTC Calculation: 427 R Axis:   43 Text Interpretation: Sinus tachycardia 12 Lead; Mason-Likar Confirmed by Orpah Greek [phone] on 05/09/2019 12:45:52 AM   Radiology CT ABDOMEN PELVIS W CONTRAST  Result Date: 05/09/2019 CLINICAL DATA:  Unspecified nausea and vomiting.  Diarrhea. EXAM: CT ABDOMEN AND PELVIS WITH CONTRAST TECHNIQUE: Multidetector CT imaging of the abdomen and pelvis was performed using the standard protocol following bolus administration of intravenous contrast. CONTRAST:  14m OMNIPAQUE IOHEXOL 300 MG/ML  SOLN COMPARISON:  CT 11/10/2011 FINDINGS: Lower chest: Lung bases are clear. Hepatobiliary: No focal hepatic lesion. No intrahepatic duct dilatation. The gallbladder is distended to 5.5 cm. Gallbladder wall is thickened to 6 mm. There is low-density sludge versus non radiodense stone within the lumen of the gallbladder. This material measures up to 3 cm (image 33/2). Common bile duct  is normal caliber. Pancreas: Pancreas is normal. No ductal dilatation. No pancreatic inflammation. Spleen: Normal spleen Adrenals/urinary tract: Adrenal glands and kidneys are normal. The ureters and bladder normal. Stomach/Bowel: Post gastric bypass surgery. Anatomy similar to 2013. No bowel obstruction. Small bowel normal. appendix normal. The colon and rectosigmoid colon are normal. Vascular/Lymphatic: Abdominal aorta is normal caliber. No periportal or retroperitoneal adenopathy. No pelvic adenopathy. Reproductive: Post hysterectomy Other: No free fluid. Musculoskeletal: No aggressive osseous lesion. IMPRESSION: 1. Distended gallbladder with thickened wall and gallbladder sludge or large stone. Findings are concerning for acute cholecystitis. 2. Normal appendix. 3. Post gastric bypass anatomy without complication. Electronically Signed   By: Suzy Bouchard M.D.   On: 05/09/2019 04:38    Procedures Procedures (including critical care time)  Medications Ordered in ED Medications  cefTRIAXone (ROCEPHIN) 2 g in sodium chloride 0.9 % 100 mL IVPB (has no administration in time range)  Chlorhexidine Gluconate Cloth 2 % PADS 6 each (has no administration in time range)    And  Chlorhexidine Gluconate Cloth 2 % PADS 6 each (has no administration in time range)  gabapentin (NEURONTIN) capsule 300 mg (has no administration in time range)  acetaminophen (TYLENOL) tablet 1,000 mg (has no administration in time range)  bupivacaine liposome (EXPAREL) 1.3 % injection 266 mg (has no administration in time range)  ceFAZolin (ANCEF) 3 g in dextrose 5 % 50 mL IVPB (has no administration in time range)    And  metroNIDAZOLE (FLAGYL) IVPB 500 mg (has no administration in time range)  lactated ringers bolus 1,000 mL (has no administration in time range)  HYDROmorphone (DILAUDID) injection 0.5-2 mg (has no administration in time range)  methocarbamol (ROBAXIN) 1,000 mg in dextrose 5 % 100 mL IVPB (has no  administration in time range)  acetaminophen (TYLENOL) suppository 650 mg (has no administration in time range)  acetaminophen (TYLENOL) tablet 325-650 mg (has no administration in time range)  ondansetron (ZOFRAN) injection 4 mg (has no administration in time range)    Or  ondansetron (ZOFRAN) 8 mg in sodium chloride 0.9 % 50 mL IVPB (has no administration in time range)  prochlorperazine (COMPAZINE) injection 5-10 mg (has no administration in time range)  lip balm (CARMEX) ointment 1 application (has no administration in time range)  magic mouthwash (has no administration in time range)  bisacodyl (DULCOLAX) suppository 10 mg (has no administration in time range)  guaiFENesin-dextromethorphan (ROBITUSSIN DM) 100-10 MG/5ML syrup 10 mL (has no administration in time range)  hydrocortisone (ANUSOL-HC) 2.5 % rectal cream 1 application (has no administration in time range)  alum & mag hydroxide-simeth (MAALOX/MYLANTA) 200-200-20 MG/5ML suspension 30 mL (has no administration in time range)  hydrocortisone cream 1 % 1 application (has no administration in time range)  menthol-cetylpyridinium (CEPACOL) lozenge 3 mg (has no administration in time range)  phenol (CHLORASEPTIC) mouth spray 1-2 spray (has no administration in time range)  insulin aspart (novoLOG) injection 0-5 Units (has no administration in time range)  insulin aspart (novoLOG) injection 0-20 Units (has no administration in time range)  sodium chloride flush (NS) 0.9 % injection 3 mL (3 mLs Intravenous Given 05/09/19 0524)  ondansetron (ZOFRAN-ODT)  disintegrating tablet 4 mg (4 mg Oral Given 05/08/19 2356)  sodium chloride 0.9 % bolus 1,000 mL (1,000 mLs Intravenous New Bag/Given 05/09/19 0134)  ondansetron (ZOFRAN) injection 4 mg (4 mg Intravenous Given 05/09/19 0134)  diphenoxylate-atropine (LOMOTIL) 2.5-0.025 MG per tablet 2 tablet (2 tablets Oral Given 05/09/19 0235)  metoCLOPramide (REGLAN) injection 10 mg (10 mg Intravenous Given  05/09/19 0234)  iohexol (OMNIPAQUE) 300 MG/ML solution 100 mL (100 mLs Intravenous Contrast Given 05/09/19 0239)  fentaNYL (SUBLIMAZE) injection 100 mcg (100 mcg Intravenous Given 05/09/19 0522)    ED Course  I have reviewed the triage vital signs and the nursing notes.  Pertinent labs & imaging results that were available during my care of the patient were reviewed by me and considered in my medical decision making (see chart for details).    MDM Rules/Calculators/A&P                      Patient presents to the emergency department with intermittent episodes of abdominal pain with nausea and vomiting.  She first had symptoms several days ago which resolved and then today has had progressively worsening pain and intractable vomiting.  Pain is diffuse across her upper abdomen.  Examination did reveal tenderness across the abdomen but there was some focal tenderness in the right upper quadrant but no obvious Murphy sign.  CT scan performed is concerning for cholecystitis.  She does have thickened gallbladder wall and possible non-radiopaque gallstone noted in the gallbladder with a distended gallbladder.  LFTs, lipase, common bile duct are normal.  Discussed with Dr. Johney Maine, general surgery.  He will evaluate the patient in the ED.  Final Clinical Impression(s) / ED Diagnoses Final diagnoses:  Cholecystitis    Rx / DC Orders ED Discharge Orders    None       Kerrion Kemppainen, Gwenyth Allegra, MD 05/09/19 [phone]

## 2019-05-09 NOTE — Transfer of Care (Signed)
Immediate Anesthesia Transfer of Care Note  Patient: Summer Lopez  Procedure(s) Performed: LAPAROSCOPIC CHOLECYSTECTOMY. (N/A Abdomen)  Patient Location: PACU  Anesthesia Type:General  Level of Consciousness: awake and alert   Airway & Oxygen Therapy: Patient Spontanous Breathing and Patient connected to face mask oxygen  Post-op Assessment: Report given to RN and Post -op Vital signs reviewed and stable  Post vital signs: Reviewed and stable  Last Vitals:  Vitals Value Taken Time  BP    Temp    Pulse 105 05/09/19 1225  Resp 25 05/09/19 1225  SpO2 99 % 05/09/19 1225  Vitals shown include unvalidated device data.  Last Pain:  Vitals:   05/09/19 0721  TempSrc:   PainSc: 10-Worst pain ever         Complications: No apparent anesthesia complications

## 2019-05-09 NOTE — ED Notes (Signed)
Pt states she is unable to give urine sample at this time.

## 2019-05-09 NOTE — Op Note (Signed)
05/09/2019  PATIENT:  Summer Lopez  49 y.o. female  Patient Care Team: Berkley Harvey, NP as PCP - General (Nurse Practitioner) Earnie Larsson, MD as Consulting Physician (Neurosurgery)  PRE-OPERATIVE DIAGNOSIS:    Acute on Chronic Calculus Cholecystitis  POST-OPERATIVE DIAGNOSIS:   Acute Necrotic on Chronic Calculus Cholecystitis Liver: Fatty steatohepatitis  PROCEDURE:   Laparoscopic lysis of adhesions x 60 minutes Laparoscopic cholecystectomy  SURGEON:  Adin Hector, MD, FACS.  ASSISTANT: Fran Lowes, PA-S, [employer]   ANESTHESIA:    General with endotracheal intubation Local anesthetic as a field block  EBL:  (See Anesthesia Intraoperative Record) Total I/O In: 2100.3 [I.V.:1000; IV Piggyback:1100.3] Out: 100 [Blood:100]   Delay start of Pharmacological VTE agent (>24hrs) due to surgical blood loss or risk of bleeding:  no  DRAINS: 19 Fr Blake closed bulb drain in the RUQ   SPECIMEN: Gallbladder    DISPOSITION OF SPECIMEN:  PATHOLOGY  COUNTS:  YES  PLAN OF CARE: Admit to inpatient   PATIENT DISPOSITION:  PACU - hemodynamically stable.  INDICATION: Morbidly obese woman status post prior bariatric procedures including Roux-en-Y revision.  Abdominal pain nausea vomiting and exam and CT scan findings suspicious for cholecystitis.  I offered cholecystectomy.  Given her obesity and prior periumbilical mesh repair, not a good candidate for single site technique.  The anatomy & physiology of hepatobiliary & pancreatic function was discussed.  The pathophysiology of gallbladder dysfunction was discussed.  Natural history risks without surgery was discussed.   I feel the risks of no intervention will lead to serious problems that outweigh the operative risks; therefore, I recommended cholecystectomy to remove the pathology.  I explained laparoscopic techniques with possible need for an open approach.  Probable cholangiogram to evaluate the bilary tract was  explained as well.    Risks such as bleeding, infection, abscess, leak, injury to other organs, need for further treatment, heart attack, death, and other risks were discussed.  I noted a good likelihood this will help address the problem.  Possibility that this will not correct all abdominal symptoms was explained.  Goals of post-operative recovery were discussed as well.  We will work to minimize complications.  An educational handout further explaining the pathology and treatment options was given as well.  Questions were answered.  The patient expresses understanding & wishes to proceed with surgery.  OR FINDINGS: Very large gallbladder turgid with acute on chronic inflammation.  Enlarged liver with fatty change.  Very inflamed gallbladder with enlarged lymph node.  Short thick cystic duct.  Dome down technique.  0 PDS ligation of stump.  Drain left in place  CASE DATA:  Type of patient?: LDOW CASE (Surgical Hospitalist [facility] Inpatient)  Status of Case? EMERGENT Add On  Infection Present At Time Of Surgery (PATOS)?  PHLEGMON    DESCRIPTION:   Informed consent was confirmed.  The patient underwent general anaesthesia without difficulty.  The patient was positioned appropriately.  VTE prevention in place.  The patient's abdomen was clipped, prepped, & draped in a sterile fashion.  Surgical timeout confirmed our plan.  Induced insufflation using a Veress technique on the left subcostal ridge.  I did optical entry to place a port in the right mid abdomen but the abdominal wall was extremely thick and fibrotic.  Therefore I reattempted in the right anterior flank to good result.  I did inspection.  The varies needle was within adhesions in the left upper quadrant but no bowel injury or other abnormality so  was safely removed.  The other port had not gone completely through the abdominal wall so was carefully introduced under laparoscopic visualization.  Another port was placed in the subxiphoid  region.  A 10 mm port.  She had some adhesions of small bowel and her gastric bypass in the left subxiphoid region but I was well away from them.  I turned attention to the right upper quadrant.  The gallbladder was extremely turgid and cannot be grasped.  Obvious phlegmon with some early purulence as well.  Localized peritonitis.  I therefore I used a laparoscopic needle aspiration to help better decompress it.  Had very thick feculent-like contents with mainly sludge.  However that allowed the gallbladder to be better grasp.  The gallbladder fundus was elevated cephalad. I used hook cautery to free the peritoneal coverings between the gallbladder and the liver on the posteriolateral and anteriomedial walls.   I used careful blunt and hook dissection to help get a good critical view of the cystic artery and cystic duct. I did further dissection to free all of the gallbladder off the liver bed to get a good critical view of the infundibulum and cystic duct.  The gallbladder was rather thick with a large liver.  Her morbid obesity and rigid abdominal wall making visualization challenging.  However with the dome down technique I could get better mobility and get better view of the gallbladder.  I dissected out the cystic artery; and, after getting a good 360 view, ligated the anterior & posterior branches of the cystic artery close on the infundibulum using clips on the retroperitoneal side and cautery on the gallbladder side.  Patient had enlarged lymph node of CLO that I carefully freed off to better expose the infundibulum.  Had quite thick visceral peritoneum consistent with acute on chronic cholecystitis.  Eventually was able to come down to a taper.  The cystic was rather foreshortened.  I had an excellent critical view.  I did not feel I had enough length to do a safe cholangiogram.  I ended up transecting through the infundibulum.  I therefore ligated the cystic duct using 0 PDS Endoloop x2 as well as  some clips that went across most of the proximal infundibulum/cystic duct and transected more infundibulum to leave just a cystic duct stump..  There had been a fair amount of inflammation and oozing but once the gallbladder removed hemostasis was markedly improved.  I ensured hemostasis on the gallbladder fossa of the liver and elsewhere. I inspected the rest of the abdomen & detected no injury nor bleeding elsewhere.  Placed the gallbladder inside and ecosac bag.  I did copious irrigation of 3 L.  Hemostasis good.  No evidence of any duodenal colon or other injury.  Because of the ischemic and nearly necrotic gallbladder with inflammation contamination and poor planes, I decided to leave a 62 [place] Blake drain at the gallbladder fossa and exited out the right flank port site.  Secured to the skin with Prolene suture  I removed the gallbladder out the right subxiphoid port site.  I had opened up the fascia to 3 cm to get the gallbladder that was massively thickened out.  I reapproximated the fascia using a laparoscopic suture passer to do #1 PDS interrupted stitches.  The did camera inspection saw no bowel or injury or other abnormalities.  Hemostasis good.  I closed the skin using 4-0 monocryl stitch.  Sterile dressings were applied. The patient was extubated & arrived in the PACU  in stable condition..  I had discussed postoperative care with the patient in the holding area.  I suspect she will need antibiotics for 5 days given the infection then ischemia/necrosis.  Got the likely was advanced dysphagia 1 (tolerated but I would feel she got the sugar I discussed operative findings, updated the patient's status, discussed probable steps to recovery, and gave postoperative recommendations to the patient's spouse.  Recommendations were made.  Questions were answered.  He expressed understanding & appreciation.  Adin Hector, M.D., F.A.C.S. Gastrointestinal and Minimally Invasive Surgery [facility] [address] [place], [place] [address] [phone] Main / Paging  05/09/2019 12:19 PM

## 2019-05-09 NOTE — Progress Notes (Signed)
Pharmacy Antibiotic Note  Summer Lopez is a 49 y.o. female admitted on 05/09/2019 with Acute Necrotic on Chronic Calculus Cholecystitis  Pharmacy has been consulted for zosyn dosing.  Plan: Zosyn 3.375g IV Q8H infused over 4hrs. Pharmacy will sign off as renal adjustment unlikely, will follow peripherally  Height: 5' 7"  (170.2 cm) Weight: 125 kg (275 lb 9.2 oz) IBW/kg (Calculated) : 61.6  Temp (24hrs), Avg:97.9 F (36.6 C), Min:97.6 F (36.4 C), Max:98.3 F (36.8 C)  Recent Labs  Lab 05/08/19 2318  WBC 10.6*  CREATININE 0.72    Estimated Creatinine Clearance: 118.1 mL/min (by C-G formula based on SCr of 0.72 mg/dL).    Allergies  Allergen Reactions  . Meloxicam Other (See Comments)    GI Upset-Ulcers  . Adhesive [Tape] Rash and Other (See Comments)    Prefers paper tape, adhesive tape burns her skin  . Morphine Nausea And Vomiting     Thank you for allowing pharmacy to be a part of this patient's care.  Dolly Rias RPh 05/09/2019, 1:57 PM

## 2019-05-09 NOTE — Anesthesia Procedure Notes (Signed)
Procedure Name: Intubation Date/Time: 05/09/2019 10:06 AM Performed by: Mykal Kirchman D, CRNA Pre-anesthesia Checklist: Patient identified, Emergency Drugs available, Suction available and Patient being monitored Patient Re-evaluated:Patient Re-evaluated prior to induction Oxygen Delivery Method: Circle system utilized Preoxygenation: Pre-oxygenation with 100% oxygen Induction Type: IV induction, Rapid sequence and Cricoid Pressure applied Laryngoscope Size: Mac and 4 Grade View: Grade II Tube type: Oral Tube size: 7.0 mm Number of attempts: 1 Airway Equipment and Method: Stylet Placement Confirmation: ETT inserted through vocal cords under direct vision,  positive ETCO2 and breath sounds checked- equal and bilateral Secured at: 22 cm Tube secured with: Tape Dental Injury: Teeth and Oropharynx as per pre-operative assessment

## 2019-05-10 LAB — CBC
HCT: 32.8 % — ABNORMAL LOW (ref 36.0–46.0)
Hemoglobin: 10.2 g/dL — ABNORMAL LOW (ref 12.0–15.0)
MCH: 28.1 pg (ref 26.0–34.0)
MCHC: 31.1 g/dL (ref 30.0–36.0)
MCV: 90.4 fL (ref 80.0–100.0)
Platelets: 322 10*3/uL (ref 150–400)
RBC: 3.63 MIL/uL — ABNORMAL LOW (ref 3.87–5.11)
RDW: 12.8 % (ref 11.5–15.5)
WBC: 17.3 10*3/uL — ABNORMAL HIGH (ref 4.0–10.5)
nRBC: 0 % (ref 0.0–0.2)

## 2019-05-10 LAB — COMPREHENSIVE METABOLIC PANEL
ALT: 26 U/L (ref 0–44)
AST: 29 U/L (ref 15–41)
Albumin: 2.6 g/dL — ABNORMAL LOW (ref 3.5–5.0)
Alkaline Phosphatase: 67 U/L (ref 38–126)
Anion gap: 9 (ref 5–15)
BUN: 11 mg/dL (ref 6–20)
CO2: 24 mmol/L (ref 22–32)
Calcium: 7.7 mg/dL — ABNORMAL LOW (ref 8.9–10.3)
Chloride: 101 mmol/L (ref 98–111)
Creatinine, Ser: 0.87 mg/dL (ref 0.44–1.00)
GFR calc Af Amer: >60 mL/min (ref >60)
GFR calc non Af Amer: >60 mL/min (ref >60)
Glucose, Bld: 293 mg/dL — ABNORMAL HIGH (ref 70–99)
Potassium: 4.3 mmol/L (ref 3.5–5.1)
Sodium: 134 mmol/L — ABNORMAL LOW (ref 135–145)
Total Bilirubin: 0.5 mg/dL (ref 0.3–1.2)
Total Protein: 6.2 g/dL — ABNORMAL LOW (ref 6.5–8.1)

## 2019-05-10 LAB — GLUCOSE, CAPILLARY
Glucose-Capillary: 303 mg/dL — ABNORMAL HIGH (ref 70–99)
Glucose-Capillary: 257 mg/dL — ABNORMAL HIGH (ref 70–99)
Glucose-Capillary: 93 mg/dL (ref 70–99)
Glucose-Capillary: 164 mg/dL — ABNORMAL HIGH (ref 70–99)

## 2019-05-10 MED ORDER — ACETAMINOPHEN 500 MG PO TABS
1000.0000 mg | ORAL_TABLET | Freq: Three times a day (TID) | ORAL | Status: DC
  Administered 2019-05-10 (×2): 1000 mg via ORAL
  Filled 2019-05-10 (×2): qty 2

## 2019-05-10 MED ORDER — ESTRADIOL 1 MG PO TABS
1.0000 mg | ORAL_TABLET | Freq: Every day | ORAL | Status: DC
  Administered 2019-05-10 – 2019-05-12 (×3): 1 mg via ORAL
  Filled 2019-05-10 (×3): qty 1

## 2019-05-10 MED ORDER — ENOXAPARIN SODIUM 40 MG/0.4ML ~~LOC~~ SOLN
40.0000 mg | SUBCUTANEOUS | Status: DC
  Administered 2019-05-10 – 2019-05-12 (×3): 40 mg via SUBCUTANEOUS
  Filled 2019-05-10 (×3): qty 0.4

## 2019-05-10 MED ORDER — AMITRIPTYLINE HCL 50 MG PO TABS
75.0000 mg | ORAL_TABLET | Freq: Every day | ORAL | Status: DC
  Administered 2019-05-10 – 2019-05-11 (×2): 75 mg via ORAL
  Filled 2019-05-10 (×3): qty 1

## 2019-05-10 MED ORDER — METFORMIN HCL 500 MG PO TABS
1000.0000 mg | ORAL_TABLET | Freq: Two times a day (BID) | ORAL | Status: DC
  Filled 2019-05-10: qty 2

## 2019-05-10 MED ORDER — GABAPENTIN 300 MG PO CAPS
300.0000 mg | ORAL_CAPSULE | Freq: Two times a day (BID) | ORAL | Status: DC
  Administered 2019-05-10 – 2019-05-12 (×5): 300 mg via ORAL
  Filled 2019-05-10 (×5): qty 1

## 2019-05-10 NOTE — Progress Notes (Signed)
Summer Lopez [id number] 23-Sep-1970  CARE TEAM:  PCP: Berkley Harvey, NP  Outpatient Care Team: Patient Care Team: Berkley Harvey, NP as PCP - General (Nurse Practitioner) Earnie Larsson, MD as Consulting Physician (Neurosurgery)  Inpatient Treatment Team: Treatment Team: Attending Provider: Edison Pace, Md, MD; Consulting Physician: Edison Pace, Md, MD; Technician: Leda Quail, NT; Student Nurse: Cathlean Sauer, Student-RN   Problem List:   Principal Problem:   Acute calculous cholecystitis Active Problems:   Hypothyroidism   Insulin-requiring or dependent type II diabetes mellitus ([place])   Poorly controlled diabetes mellitus ([place])   Essential hypertension   Morbid obesity ([place])   Hx of laparoscopic gastric banding   History of Roux-en-Y gastric bypass   1 Day Post-Op  05/09/2019  Procedure(s): LAPAROSCOPIC CHOLECYSTECTOMY  Assessment  Acute calculous cholecystitis Insulin-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 Anticipate need for antibiotics at discharge.  -VTE prophylaxis- SCDs, etc -mobilize as tolerated to help recovery  30 minutes spent in review, evaluation, examination, counseling, and coordination of care.  More than 50% of that time was spent in counseling.  05/10/2019   Subjective: Acute calculous cholecystitis. POD 1 s/p laparoscopic cholecystectomy  Patient with mild soreness but pain is improved. Able to move bowels and having flatus. Tolerating clear liquids.  Patient expressed appreciation for our care.   Objective:  Vital signs:  Vitals:   05/09/19 2340 05/10/19 0130 05/10/19 0539 05/10/19 1034  BP: (!) 142/91 (!) 138/91 135/82 116/83  Pulse: (!) 112 (!) 110 (!) 106 (!) 108  Resp: 18 16 16 16   Temp: 99.3 F (37.4 C) 99.4 F (37.4 C) 98.8 F (37.1 C) 98.1 F (36.7 C)  TempSrc: Oral Oral Oral     SpO2: 93% 96% 98% 98%  Weight:      Height:        Last BM Date: 05/08/19  Intake/Output   Yesterday:  04/10 0701 - 04/11 0700 In: 4526.3 [P.O.:700; I.V.:2626; IV Piggyback:1200.3] Out: 1720 [Urine:1500; Drains:120; Blood:100] This shift:  Total I/O In: -  Out: 425 [Urine:400; Drains:25]  Bowel function:  Flatus: YES  BM:  YES  Drain: Serosanguinous   Physical Exam:  General: Pt awake/alert in no acute distress Eyes: PERRL, normal EOM.  Sclera clear.  No icterus Neuro: CN II-XII intact w/o focal sensory/motor deficits. Lymph: No head/neck/groin lymphadenopathy Psych:  No delerium/psychosis/paranoia.  Oriented x 4 HENT: Normocephalic, Mucus membranes moist.  No thrush Neck: Supple, No tracheal deviation.  No obvious thyromegaly Chest: No pain to chest wall compression.  Good respiratory excursion.  No audible wheezing CV:  Pulses intact.  Regular rhythm.  No major extremity edema MS: Normal AROM mjr joints.  No obvious deformity  Abdomen: Soft.  Mildy distended.  Mildly tender at incisions only.  No evidence of peritonitis.  No incarcerated hernias. Incision sites clean and dry.   Ext:   No deformity.  No mjr edema.  No cyanosis Skin: No petechiae / purpurea.  No major sores.  Warm and dry    Results:   Cultures: Recent Results (from the past 720 hour(s))  Respiratory Panel by RT PCR (Flu A&B, Covid) - Nasopharyngeal Swab     Status: None   Collection Time: 05/09/19  7:39 AM   Specimen: Nasopharyngeal Swab  Result Value Ref Range Status   SARS Coronavirus 2 by RT PCR NEGATIVE NEGATIVE Final  Comment: (NOTE) SARS-CoV-2 target nucleic acids are NOT DETECTED. The SARS-CoV-2 RNA is generally detectable in upper respiratoy specimens during the acute phase of infection. The lowest concentration of SARS-CoV-2 viral copies this assay can detect is 131 copies/mL. A negative result does not preclude SARS-Cov-2 infection and should not be used as the sole basis for  treatment or other patient management decisions. A negative result may occur with  improper specimen collection/handling, submission of specimen other than nasopharyngeal swab, presence of viral mutation(s) within the areas targeted by this assay, and inadequate number of viral copies (<131 copies/mL). A negative result must be combined with clinical observations, patient history, and epidemiological information. The expected result is Negative. Fact Sheet for Patients:  PinkCheek.be Fact Sheet for Healthcare Providers:  GravelBags.it This test is not yet ap proved or cleared by the [place] FDA and  has been authorized for detection and/or diagnosis of SARS-CoV-2 by FDA under an Emergency Use Authorization (EUA). This EUA will remain  in effect (meaning this test can be used) for the duration of the COVID-19 declaration under Section 564(b)(1) of the Act, 21 U.S.C. section 360bbb-3(b)(1), unless the authorization is terminated or revoked sooner.    Influenza A by PCR NEGATIVE NEGATIVE Final   Influenza B by PCR NEGATIVE NEGATIVE Final    Comment: (NOTE) The Xpert Xpress SARS-CoV-2/FLU/RSV assay is intended as an aid in  the diagnosis of influenza from Nasopharyngeal swab specimens and  should not be used as a sole basis for treatment. Nasal washings and  aspirates are unacceptable for Xpert Xpress SARS-CoV-2/FLU/RSV  testing. Fact Sheet for Patients: PinkCheek.be Fact Sheet for Healthcare Providers: GravelBags.it This test is not yet approved or cleared by the [place] FDA and  has been authorized for detection and/or diagnosis of SARS-CoV-2 by  FDA under an Emergency Use Authorization (EUA). This EUA will remain  in effect (meaning this test can be used) for the duration of the  Covid-19 declaration under Section 564(b)(1) of the Act, 21  U.S.C. section  360bbb-3(b)(1), unless the authorization is  terminated or revoked. Performed at [facility], [place] [address] [place], [place] [address]     Labs: Results for orders placed or performed during the hospital encounter of 05/09/19 (from the past 48 hour(s))  Lipase, blood     Status: None   Collection Time: 05/08/19 11:18 PM  Result Value Ref Range   Lipase 24 11 - 51 U/L    Comment: Performed at [facility], [place] [address] [place], [place] [address]  Comprehensive metabolic panel     Status: Abnormal   Collection Time: 05/08/19 11:18 PM  Result Value Ref Range   Sodium 134 (L) 135 - 145 mmol/L   Potassium 4.3 3.5 - 5.1 mmol/L   Chloride 99 98 - 111 mmol/L   CO2 24 22 - 32 mmol/L   Glucose, Bld 201 (H) 70 - 99 mg/dL    Comment: Glucose reference range applies only to samples taken after fasting for at least 8 hours.   BUN 11 6 - 20 mg/dL   Creatinine, Ser 0.72 0.44 - 1.00 mg/dL   Calcium 8.9 8.9 - 10.3 mg/dL   Total Protein 7.3 6.5 - 8.1 g/dL   Albumin 3.4 (L) 3.5 - 5.0 g/dL   AST 12 (L) 15 - 41 U/L   ALT 11 0 - 44 U/L   Alkaline Phosphatase 69 38 - 126 U/L   Total Bilirubin 0.1 (L) 0.3 - 1.2  mg/dL   GFR calc non Af Amer >60 >60 mL/min   GFR calc Af Amer >60 >60 mL/min   Anion gap 11 5 - 15    Comment: Performed at [facility], [place] [address], [place], [place] [address]  CBC     Status: Abnormal   Collection Time: 05/08/19 11:18 PM  Result Value Ref Range   WBC 10.6 (H) 4.0 - 10.5 K/uL   RBC 4.00 3.87 - 5.11 MIL/uL   Hemoglobin 11.3 (L) 12.0 - 15.0 g/dL   HCT 36.6 36.0 - 46.0 %   MCV 91.5 80.0 - 100.0 fL   MCH 28.3 26.0 - 34.0 pg   MCHC 30.9 30.0 - 36.0 g/dL   RDW 12.8 11.5 - 15.5 %   Platelets 352 150 - 400 K/uL   nRBC 0.0 0.0 - 0.2 %    Comment: Performed at [facility], [place] [address], [place], [place] [address]  I-Stat beta hCG blood, ED     Status: None   Collection Time: 05/08/19  11:31 PM  Result Value Ref Range   I-stat hCG, quantitative <5.0 <5 mIU/mL   Comment 3            Comment:   GEST. AGE      CONC.  (mIU/mL)   <=1 WEEK        5 - 50     2 WEEKS       50 - 500     3 WEEKS       100 - 10,000     4 WEEKS     1,000 - 30,000        FEMALE AND NON-PREGNANT FEMALE:     LESS THAN 5 mIU/mL   Respiratory Panel by RT PCR (Flu A&B, Covid) - Nasopharyngeal Swab     Status: None   Collection Time: 05/09/19  7:39 AM   Specimen: Nasopharyngeal Swab  Result Value Ref Range   SARS Coronavirus 2 by RT PCR NEGATIVE NEGATIVE    Comment: (NOTE) SARS-CoV-2 target nucleic acids are NOT DETECTED. The SARS-CoV-2 RNA is generally detectable in upper respiratoy specimens during the acute phase of infection. The lowest concentration of SARS-CoV-2 viral copies this assay can detect is 131 copies/mL. A negative result does not preclude SARS-Cov-2 infection and should not be used as the sole basis for treatment or other patient management decisions. A negative result may occur with  improper specimen collection/handling, submission of specimen other than nasopharyngeal swab, presence of viral mutation(s) within the areas targeted by this assay, and inadequate number of viral copies (<131 copies/mL). A negative result must be combined with clinical observations, patient history, and epidemiological information. The expected result is Negative. Fact Sheet for Patients:  PinkCheek.be Fact Sheet for Healthcare Providers:  GravelBags.it This test is not yet ap proved or cleared by the [place] FDA and  has been authorized for detection and/or diagnosis of SARS-CoV-2 by FDA under an Emergency Use Authorization (EUA). This EUA will remain  in effect (meaning this test can be used) for the duration of the COVID-19 declaration under Section 564(b)(1) of the Act, 21 U.S.C. section 360bbb-3(b)(1), unless the authorization is  terminated or revoked sooner.    Influenza A by PCR NEGATIVE NEGATIVE   Influenza B by PCR NEGATIVE NEGATIVE    Comment: (NOTE) The Xpert Xpress SARS-CoV-2/FLU/RSV assay is intended as an aid in  the diagnosis of influenza from Nasopharyngeal swab specimens and  should not  be used as a sole basis for treatment. Nasal washings and  aspirates are unacceptable for Xpert Xpress SARS-CoV-2/FLU/RSV  testing. Fact Sheet for Patients: PinkCheek.be Fact Sheet for Healthcare Providers: GravelBags.it This test is not yet approved or cleared by the [place] FDA and  has been authorized for detection and/or diagnosis of SARS-CoV-2 by  FDA under an Emergency Use Authorization (EUA). This EUA will remain  in effect (meaning this test can be used) for the duration of the  Covid-19 declaration under Section 564(b)(1) of the Act, 21  U.S.C. section 360bbb-3(b)(1), unless the authorization is  terminated or revoked. Performed at [facility], [place] [address] [place], [place] [address]   CBG monitoring, ED     Status: Abnormal   Collection Time: 05/09/19  7:56 AM  Result Value Ref Range   Glucose-Capillary 290 (H) 70 - 99 mg/dL    Comment: Glucose reference range applies only to samples taken after fasting for at least 8 hours.  Glucose, capillary     Status: Abnormal   Collection Time: 05/09/19 12:28 PM  Result Value Ref Range   Glucose-Capillary 338 (H) 70 - 99 mg/dL    Comment: Glucose reference range applies only to samples taken after fasting for at least 8 hours.  Glucose, capillary     Status: Abnormal   Collection Time: 05/09/19  1:05 PM  Result Value Ref Range   Glucose-Capillary 325 (H) 70 - 99 mg/dL    Comment: Glucose reference range applies only to samples taken after fasting for at least 8 hours.  Glucose, capillary     Status: Abnormal   Collection Time: 05/09/19  5:15 PM  Result Value Ref Range    Glucose-Capillary 244 (H) 70 - 99 mg/dL    Comment: Glucose reference range applies only to samples taken after fasting for at least 8 hours.  Glucose, capillary     Status: Abnormal   Collection Time: 05/09/19  8:41 PM  Result Value Ref Range   Glucose-Capillary 300 (H) 70 - 99 mg/dL    Comment: Glucose reference range applies only to samples taken after fasting for at least 8 hours.  CBC     Status: Abnormal   Collection Time: 05/10/19  5:21 AM  Result Value Ref Range   WBC 17.3 (H) 4.0 - 10.5 K/uL   RBC 3.63 (L) 3.87 - 5.11 MIL/uL   Hemoglobin 10.2 (L) 12.0 - 15.0 g/dL   HCT 32.8 (L) 36.0 - 46.0 %   MCV 90.4 80.0 - 100.0 fL   MCH 28.1 26.0 - 34.0 pg   MCHC 31.1 30.0 - 36.0 g/dL   RDW 12.8 11.5 - 15.5 %   Platelets 322 150 - 400 K/uL   nRBC 0.0 0.0 - 0.2 %    Comment: Performed at [facility], [place] [address], [place], [place] [address]  Comprehensive metabolic panel     Status: Abnormal   Collection Time: 05/10/19  5:21 AM  Result Value Ref Range   Sodium 134 (L) 135 - 145 mmol/L   Potassium 4.3 3.5 - 5.1 mmol/L   Chloride 101 98 - 111 mmol/L   CO2 24 22 - 32 mmol/L   Glucose, Bld 293 (H) 70 - 99 mg/dL    Comment: Glucose reference range applies only to samples taken after fasting for at least 8 hours.   BUN 11 6 - 20 mg/dL   Creatinine, Ser 0.87 0.44 - 1.00 mg/dL   Calcium 7.7 (L) 8.9 -  10.3 mg/dL   Total Protein 6.2 (L) 6.5 - 8.1 g/dL   Albumin 2.6 (L) 3.5 - 5.0 g/dL   AST 29 15 - 41 U/L   ALT 26 0 - 44 U/L   Alkaline Phosphatase 67 38 - 126 U/L   Total Bilirubin 0.5 0.3 - 1.2 mg/dL   GFR calc non Af Amer >60 >60 mL/min   GFR calc Af Amer >60 >60 mL/min   Anion gap 9 5 - 15    Comment: Performed at [facility], [place] [address] [place], [place] [address]  Glucose, capillary     Status: Abnormal   Collection Time: 05/10/19  7:50 AM  Result Value Ref Range   Glucose-Capillary 303 (H) 70 - 99 mg/dL    Comment: Glucose reference  range applies only to samples taken after fasting for at least 8 hours.    Imaging / Studies: CT ABDOMEN PELVIS W CONTRAST  Result Date: 05/09/2019 CLINICAL DATA:  Unspecified nausea and vomiting.  Diarrhea. EXAM: CT ABDOMEN AND PELVIS WITH CONTRAST TECHNIQUE: Multidetector CT imaging of the abdomen and pelvis was performed using the standard protocol following bolus administration of intravenous contrast. CONTRAST:  122m OMNIPAQUE IOHEXOL 300 MG/ML  SOLN COMPARISON:  CT 11/10/2011 FINDINGS: Lower chest: Lung bases are clear. Hepatobiliary: No focal hepatic lesion. No intrahepatic duct dilatation. The gallbladder is distended to 5.5 cm. Gallbladder wall is thickened to 6 mm. There is low-density sludge versus non radiodense stone within the lumen of the gallbladder. This material measures up to 3 cm (image 33/2). Common bile duct is normal caliber. Pancreas: Pancreas is normal. No ductal dilatation. No pancreatic inflammation. Spleen: Normal spleen Adrenals/urinary tract: Adrenal glands and kidneys are normal. The ureters and bladder normal. Stomach/Bowel: Post gastric bypass surgery. Anatomy similar to 2013. No bowel obstruction. Small bowel normal. appendix normal. The colon and rectosigmoid colon are normal. Vascular/Lymphatic: Abdominal aorta is normal caliber. No periportal or retroperitoneal adenopathy. No pelvic adenopathy. Reproductive: Post hysterectomy Other: No free fluid. Musculoskeletal: No aggressive osseous lesion. IMPRESSION: 1. Distended gallbladder with thickened wall and gallbladder sludge or large stone. Findings are concerning for acute cholecystitis. 2. Normal appendix. 3. Post gastric bypass anatomy without complication. Electronically Signed   By: SSuzy BouchardM.D.   On: 05/09/2019 04:38    Medications / Allergies: per chart  Antibiotics: Anti-infectives (From admission, onward)   Start     Dose/Rate Route Frequency Ordered Stop   05/09/19 1800  piperacillin-tazobactam  (ZOSYN) IVPB 3.375 g     3.375 g 12.5 mL/hr over 240 Minutes Intravenous Every 8 hours 05/09/19 1245     05/09/19 1245  piperacillin-tazobactam (ZOSYN) IVPB 3.375 g  Status:  Discontinued     3.375 g 100 mL/hr over 30 Minutes Intravenous  Once 05/09/19 1243 05/09/19 1245   05/09/19 0930  ceFAZolin (ANCEF) 3 g in dextrose 5 % 50 mL IVPB     3 g 100 mL/hr over 30 Minutes Intravenous On call to O.R. 05/09/19 0426804/10/21 1009   05/09/19 0930  metroNIDAZOLE (FLAGYL) IVPB 500 mg     500 mg 100 mL/hr over 60 Minutes Intravenous On call to O.R. 05/09/19 [id number]/10/21 1023   05/09/19 0600  cefTRIAXone (ROCEPHIN) 2 g in sodium chloride 0.9 % 100 mL IVPB     2 g 200 mL/hr over 30 Minutes Intravenous  Once 05/09/19 0550 05/09/19 0705        Note: Portions of this report may have been transcribed using voice  recognition software. Every effort was made to ensure accuracy; however, inadvertent computerized transcription errors may be present.   Any transcriptional errors that result from this process are unintentional.     Adolfo Granieri M Amr [place] PA-S Adin Hector, MD, FACS, MASCRS Gastrointestinal and Minimally Invasive Surgery    [address] [place],  [address] [phone] Main / Paging [phone] Fax Please see Amion for pager number, especial 5pm - 7am.

## 2019-05-10 NOTE — Progress Notes (Signed)
Patient has a MEWS yellow because her heart rate is 113, patient states she is in pain 10/10, pain medication was given and mews protocol was started.

## 2019-05-11 LAB — COMPREHENSIVE METABOLIC PANEL
ALT: 21 U/L (ref 0–44)
AST: 22 U/L (ref 15–41)
Albumin: 2.3 g/dL — ABNORMAL LOW (ref 3.5–5.0)
Alkaline Phosphatase: 80 U/L (ref 38–126)
Anion gap: 11 (ref 5–15)
BUN: 16 mg/dL (ref 6–20)
CO2: 24 mmol/L (ref 22–32)
Calcium: 7.4 mg/dL — ABNORMAL LOW (ref 8.9–10.3)
Chloride: 98 mmol/L (ref 98–111)
Creatinine, Ser: 1.16 mg/dL — ABNORMAL HIGH (ref 0.44–1.00)
GFR calc Af Amer: >60 mL/min (ref >60)
GFR calc non Af Amer: 56 mL/min — ABNORMAL LOW (ref >60)
Glucose, Bld: 232 mg/dL — ABNORMAL HIGH (ref 70–99)
Potassium: 3.9 mmol/L (ref 3.5–5.1)
Sodium: 133 mmol/L — ABNORMAL LOW (ref 135–145)
Total Bilirubin: 0.8 mg/dL (ref 0.3–1.2)
Total Protein: 5.9 g/dL — ABNORMAL LOW (ref 6.5–8.1)

## 2019-05-11 LAB — CBC
HCT: 29.2 % — ABNORMAL LOW (ref 36.0–46.0)
Hemoglobin: 9 g/dL — ABNORMAL LOW (ref 12.0–15.0)
MCH: 29 pg (ref 26.0–34.0)
MCHC: 30.8 g/dL (ref 30.0–36.0)
MCV: 94.2 fL (ref 80.0–100.0)
Platelets: 281 10*3/uL (ref 150–400)
RBC: 3.1 MIL/uL — ABNORMAL LOW (ref 3.87–5.11)
RDW: 13.4 % (ref 11.5–15.5)
WBC: 17.2 10*3/uL — ABNORMAL HIGH (ref 4.0–10.5)
nRBC: 0 % (ref 0.0–0.2)

## 2019-05-11 LAB — HEMOGLOBIN A1C
Hgb A1c MFr Bld: 9.8 % — ABNORMAL HIGH (ref 4.8–5.6)
Mean Plasma Glucose: 235 mg/dL

## 2019-05-11 LAB — GLUCOSE, CAPILLARY
Glucose-Capillary: 210 mg/dL — ABNORMAL HIGH (ref 70–99)
Glucose-Capillary: 238 mg/dL — ABNORMAL HIGH (ref 70–99)
Glucose-Capillary: 225 mg/dL — ABNORMAL HIGH (ref 70–99)
Glucose-Capillary: 203 mg/dL — ABNORMAL HIGH (ref 70–99)

## 2019-05-11 MED ORDER — MAGNESIUM CITRATE PO SOLN
1.0000 | Freq: Once | ORAL | Status: AC
  Administered 2019-05-11: 1 via ORAL
  Filled 2019-05-11: qty 296

## 2019-05-11 MED ORDER — HYDROMORPHONE HCL 1 MG/ML IJ SOLN
0.5000 mg | INTRAMUSCULAR | Status: DC | PRN
  Administered 2019-05-11 – 2019-05-12 (×2): 1 mg via INTRAVENOUS
  Filled 2019-05-11 (×2): qty 1

## 2019-05-11 MED ORDER — ACETAMINOPHEN 325 MG PO TABS: 650.0000 mg | ORAL_TABLET | Freq: Four times a day (QID) | ORAL | Status: DC | PRN

## 2019-05-11 MED ORDER — POLYETHYLENE GLYCOL 3350 17 G PO PACK
17.0000 g | PACK | Freq: Every day | ORAL | Status: DC
  Administered 2019-05-12: 17 g via ORAL
  Filled 2019-05-11: qty 1

## 2019-05-11 MED ORDER — DOCUSATE SODIUM 100 MG PO CAPS
100.0000 mg | ORAL_CAPSULE | Freq: Two times a day (BID) | ORAL | Status: DC
  Administered 2019-05-11 – 2019-05-12 (×3): 100 mg via ORAL
  Filled 2019-05-11 (×3): qty 1

## 2019-05-11 MED ORDER — HYDROCODONE-ACETAMINOPHEN 10-325 MG PO TABS
1.0000 | ORAL_TABLET | ORAL | Status: DC | PRN
  Administered 2019-05-11 – 2019-05-12 (×3): 1 via ORAL
  Filled 2019-05-11 (×3): qty 1

## 2019-05-11 MED ORDER — CYCLOBENZAPRINE HCL 10 MG PO TABS
10.0000 mg | ORAL_TABLET | Freq: Three times a day (TID) | ORAL | Status: DC | PRN
  Administered 2019-05-11 – 2019-05-12 (×2): 10 mg via ORAL
  Filled 2019-05-11 (×2): qty 1

## 2019-05-11 NOTE — Progress Notes (Signed)
[facility] Progress Note  2 Days Post-Op  Subjective: CC-  Up walking around room. States that she is having a lot of crampy abdominal pain. Pain is intermittent and random. Denies n/v. Last BM was 1 week ago. Drinking a lot of fluids but does not have an appetite. Takes hydrocodone 10/325 at home up to 4 times daily. IV dilaudid helps her pain. She was given oxy 25m this morning but states this did not help. BP low. Denies lightheadedness or dizziness. Hgb 9 from 10.2.  Objective: Vital signs in last 24 hours: Temp:  [97.8 F (36.6 C)-99.1 F (37.3 C)] 99.1 F (37.3 C) (04/12 0535) Pulse Rate:  [98-113] 105 (04/12 0535) Resp:  [16-18] 18 (04/12 0535) BP: (86-118)/(58-83) 89/59 (04/12 0535) SpO2:  [83 %-100 %] 100 % (04/12 0535) Weight:  [136.3 kg] 136.3 kg (04/12 0535) Last BM Date: 05/08/19  Intake/Output from previous day: 04/11 0701 - 04/12 0700 In: 3525.6 [P.O.:960; I.V.:2315.6; IV Piggyback:250] Out: 1465 [Urine:1400; Drains:65] Intake/Output this shift: No intake/output data recorded.  PE: Gen:  Alert, NAD, pleasant HEENT: EOM's intact, pupils equal and round Card:  RRR Pulm:  CTAB, no W/R/R, rate and effort normal Abd: obese, soft, mild TTP around drain, hypoactive BS, lap incisions cdi, drain with trace serosanguinous fluid in bulb  Lab Results:  Recent Labs    05/10/19 0521 05/11/19 0741  WBC 17.3* 17.2*  HGB 10.2* 9.0*  HCT 32.8* 29.2*  PLT 322 281   BMET Recent Labs    05/10/19 0521 05/11/19 0741  NA 134* 133*  K 4.3 3.9  CL 101 98  CO2 24 24  GLUCOSE 293* 232*  BUN 11 16  CREATININE 0.87 1.16*  CALCIUM 7.7* 7.4*   PT/INR No results for input(s): LABPROT, INR in the last 72 hours. CMP     Component Value Date/Time   NA 133 (L) 05/11/2019 0741   K 3.9 05/11/2019 0741   CL 98 05/11/2019 0741   CO2 24 05/11/2019 0741   GLUCOSE 232 (H) 05/11/2019 0741   BUN 16 05/11/2019 0741   CREATININE 1.16 (H) 05/11/2019 0741   CALCIUM  7.4 (L) 05/11/2019 0741   PROT 5.9 (L) 05/11/2019 0741   ALBUMIN 2.3 (L) 05/11/2019 0741   AST 22 05/11/2019 0741   ALT 21 05/11/2019 0741   ALKPHOS 80 05/11/2019 0741   BILITOT 0.8 05/11/2019 0741   GFRNONAA 56 (L) 05/11/2019 0741   GFRAA >60 05/11/2019 0741   Lipase     Component Value Date/Time   LIPASE 24 05/08/2019 2318       Studies/Results: No results found.  Anti-infectives: Anti-infectives (From admission, onward)   Start     Dose/Rate Route Frequency Ordered Stop   05/09/19 1800  piperacillin-tazobactam (ZOSYN) IVPB 3.375 g     3.375 g 12.5 mL/hr over 240 Minutes Intravenous Every 8 hours 05/09/19 1245     05/09/19 1245  piperacillin-tazobactam (ZOSYN) IVPB 3.375 g  Status:  Discontinued     3.375 g 100 mL/hr over 30 Minutes Intravenous  Once 05/09/19 1243 05/09/19 1245   05/09/19 0930  ceFAZolin (ANCEF) 3 g in dextrose 5 % 50 mL IVPB     3 g 100 mL/hr over 30 Minutes Intravenous On call to O.R. 05/09/19 [id number]/10/21 1009   05/09/19 0930  metroNIDAZOLE (FLAGYL) IVPB 500 mg     500 mg 100 mL/hr over 60 Minutes Intravenous On call to O.R. 05/09/19 [id number]/10/21 1023   05/09/19 0600  cefTRIAXone (  ROCEPHIN) 2 g in sodium chloride 0.9 % 100 mL IVPB     2 g 200 mL/hr over 30 Minutes Intravenous  Once 05/09/19 0550 05/09/19 0705       Assessment/Plan IDDM HTN HLD Depression Morbid obesity ABL anemia - Hgb 9 from 10.2, monitor AKI - Cr 1.16, continue IVF  Acute Necrotic on Chronic Calculus Cholecystitis S/p laparoscopic cholecystectomy 4/10 Dr. Johney Maine - POD#2 - needs 5 days abx postop - JP drain output serosanguinous - Change oxy to hydrocodone and give flexeril TID PRN muscle spasms. Wean IV dilaudid. Magnesium citrate for constipation. Continue ambulating.   ID - zosyn 4/10>>day#3 FEN - IVF. CM diet VTE - SCDs, lovenox Foley - none Follow up - 1 week nurse clinic for drain check   LOS: 2 days    Wellington Hampshire, [facility]  Surgery 05/11/2019, 8:41 AM Please see Amion for pager number during day hours 7:00am-4:30pm

## 2019-05-11 NOTE — Discharge Instructions (Signed)
LAPAROSCOPIC SURGERY: POST OP INSTRUCTIONS  ######################################################################  EAT Gradually transition to a high fiber diet with a fiber supplement over the next few weeks after discharge.  Start with a pureed / full liquid diet (see below)  WALK Walk an hour a day.  Control your pain to do that.    CONTROL PAIN Control pain so that you can walk, sleep, tolerate sneezing/coughing, go up/down stairs.  HAVE A BOWEL MOVEMENT DAILY Keep your bowels regular to avoid problems.  OK to try a laxative to override constipation.  OK to use an antidairrheal to slow down diarrhea.  Call if not better after 2 tries  CALL IF YOU HAVE PROBLEMS/CONCERNS Call if you are still struggling despite following these instructions. Call if you have concerns not answered by these instructions  ######################################################################    1. DIET: Follow a light bland diet & liquids the first 24 hours after arrival home, such as soup, liquids, starches, etc.  Be sure to drink plenty of fluids.  Quickly advance to a usual solid diet within a few days.  Avoid fast food or heavy meals as your are more likely to get nauseated or have irregular bowels.  A low-fat, high-fiber diet for the rest of your life is ideal.  2. Take your usually prescribed home medications unless otherwise directed.  3. PAIN CONTROL: a. Pain is best controlled by a usual combination of three different methods TOGETHER: i. Ice/Heat ii. Over the counter pain medication iii. Prescription pain medication b. Most patients will experience some swelling and bruising around the incisions.  Ice packs or heating pads (30-60 minutes up to 6 times a day) will help. Use ice for the first few days to help decrease swelling and bruising, then switch to heat to help relax tight/sore spots and speed recovery.  Some people prefer to use ice alone, heat alone, alternating between ice & heat.   Experiment to what works for you.  Swelling and bruising can take several weeks to resolve.   c. It is helpful to take an over-the-counter pain medication regularly for the first few weeks.  Choose one of the following that works best for you: i. Naproxen (Aleve, etc)  Two 252m tabs twice a day ii. Ibuprofen (Advil, etc) Three 2063mtabs four times a day (every meal & bedtime) iii. Acetaminophen (Tylenol, etc) 500-65063mour times a day (every meal & bedtime) d. A  prescription for pain medication (such as oxycodone, hydrocodone, tramadol, gabapentin, methocarbamol, etc) should be given to you upon discharge.  Take your pain medication as prescribed.  i. If you are having problems/concerns with the prescription medicine (does not control pain, nausea, vomiting, rash, itching, etc), please call us [phone] see if we need to switch you to a different pain medicine that will work better for you and/or control your side effect better. ii. If you need a refill on your pain medication, please give us [place] hour notice.  contact your pharmacy.  They will contact our office to request authorization. Prescriptions will not be filled after 5 pm or on week-ends  4. Avoid getting constipated.   a. Between the surgery and the pain medications, it is common to experience some constipation.   b. Increasing fluid intake and taking a fiber supplement (such as Metamucil, Citrucel, FiberCon, MiraLax, etc) 1-2 times a day regularly will usually help prevent this problem from occurring.   c. A mild laxative (prune juice, Milk of Magnesia, MiraLax, etc) should be taken according to  package directions if there are no bowel movements after 48 hours.   5. Watch out for diarrhea.   a. If you have many loose bowel movements, simplify your diet to bland foods & liquids for a few days.   b. Stop any stool softeners and decrease your fiber supplement.   c. Switching to mild anti-diarrheal medications (Kayopectate, Pepto  Bismol) can help.   d. If this worsens or does not improve, please call us.  6. Wash / shower every day.  You may shower over the dressings as they are waterproof.  Continue to shower over incision(s) after the dressing is off.  7. Remove your waterproof bandages 5 days after surgery.  You may leave the incision open to air.  You may replace a dressing/Band-Aid to cover the incision for comfort if you wish.   8. ACTIVITIES as tolerated:   a. You may resume regular (light) daily activities beginning the next day--such as daily self-care, walking, climbing stairs--gradually increasing activities as tolerated.  If you can walk 30 minutes without difficulty, it is safe to try more intense activity such as jogging, treadmill, bicycling, low-impact aerobics, swimming, etc. b. Save the most intensive and strenuous activity for last such as sit-ups, heavy lifting, contact sports, etc  Refrain from any heavy lifting or straining until you are off narcotics for pain control.   c. DO NOT PUSH THROUGH PAIN.  Let pain be your guide: If it hurts to do something, don't do it.  Pain is your body warning you to avoid that activity for another week until the pain goes down. d. You may drive when you are no longer taking prescription pain medication, you can comfortably wear a seatbelt, and you can safely maneuver your car and apply brakes. e. Dennis Bast may have sexual intercourse when it is comfortable.  9. FOLLOW UP in our office a. Please call [facility] at [phone] to set up an appointment to see your surgeon in the office for a follow-up appointment approximately 2-3 weeks after your surgery. b. Make sure that you call for this appointment the day you arrive home to insure a convenient appointment time.  10. IF YOU HAVE DISABILITY OR FAMILY LEAVE FORMS, BRING THEM TO THE OFFICE FOR PROCESSING.  DO NOT GIVE THEM TO YOUR DOCTOR.   WHEN TO CALL us [phone]: 1. Poor pain control 2. Reactions / problems with new  medications (rash/itching, nausea, etc)  3. Fever over 101.5 F (38.5 C) 4. Inability to urinate 5. Nausea and/or vomiting 6. Worsening swelling or bruising 7. Continued bleeding from incision. 8. Increased pain, redness, or drainage from the incision   The clinic staff is available to answer your questions during regular business hours (8:30am-5pm).  Please dont hesitate to call and ask to speak to one of our nurses for clinical concerns.   If you have a medical emergency, go to the nearest emergency room or call 911.  A surgeon from [facility] is always on call at the [facility], [place], [place], [place], [place]  [address] ? MAIN: [phone] ? TOLL FREE: [phone] ?  FAX [phone] [email or web address]    [facility] Surgical drains are used to remove extra fluid that normally builds up in a surgical wound after surgery. A surgical drain helps to heal a surgical wound. Different kinds of surgical drains include:  Active drains. These drains use suction to pull drainage away from the  surgical wound. Drainage flows through a tube to a container outside of the body. With these drains, you need to keep the bulb or the drainage container flat (compressed) at all times, except while you empty it. Flattening the bulb or container creates suction.  Passive drains. These drains allow fluid to drain naturally, by gravity. Drainage flows through a tube to a bandage (dressing) or a container outside of the body. Passive drains do not need to be emptied. A drain is placed during surgery. Right after surgery, drainage is usually bright red and a little thicker than water. The drainage may gradually turn yellow or pink and become thinner. It is likely that your health care provider will remove the drain when the drainage stops or when the amount decreases to 1-2 Tbsp (15-30 mL) during a 24-hour  period. Supplies needed:  Tape.  Germ-free cleaning solution (sterile saline).  Cotton swabs.  Split gauze drain sponge: 4 x 4 inches (10 x 10 cm).  Gauze square: 4 x 4 inches (10 x 10 cm). How to care for your surgical drain Care for your drain as told by your health care provider. This is important to help prevent infection. If your drain is placed at your back, or any other hard-to-reach area, ask another person to assist you in performing the following tasks: General care  Keep the skin around the drain dry and covered with a dressing at all times.  Check your drain area every day for signs of infection. Check for: ? Redness, swelling, or pain. ? Pus or a bad smell. ? Cloudy drainage. ? Tenderness or pressure at the drain exit site. Changing the dressing Follow instructions from your health care provider about how to change your dressing. Change your dressing at least once a day. Change it more often if needed to keep the dressing dry. Make sure you: 1. Gather your supplies. 2. Wash your hands with soap and water before you change your dressing. If soap and water are not available, use hand sanitizer. 3. Remove the old dressing. Avoid using scissors to do that. 4. Wash your hands with soap and water again after removing the old dressing. 5. Use sterile saline to clean your skin around the drain. You may need to use a cotton swab to clean the skin. 6. Place the tube through the slit in a drain sponge. Place the drain sponge so that it covers your wound. 7. Place the gauze square or another drain sponge on top of the drain sponge that is on the wound. Make sure the tube is between those layers. 8. Tape the dressing to your skin. 9. Tape the drainage tube to your skin 1-2 inches (2.5-5 cm) below the place where the tube enters your body. Taping keeps the tube from pulling on any stitches (sutures) that you have. 10. Wash your hands with soap and water. 11. Write down the color of  your drainage and how often you change your dressing. How to empty your active drain  1. Make sure that you have a measuring cup that you can empty your drainage into. 2. Wash your hands with soap and water. If soap and water are not available, use hand sanitizer. 3. Loosen any pins or clips that hold the tube in place. 4. If your health care provider tells you to strip the tube to prevent clots and tube blockages: ? Hold the tube at the skin with one hand. Use your other hand to pinch the tubing with your  thumb and first finger. ? Gently move your fingers down the tube while squeezing very lightly. This clears any drainage, clots, or tissue from the tube. ? You may need to do this several times each day to keep the tube clear. Do not pull on the tube. 5. Open the bulb cap or the drain plug. Do not touch the inside of the cap or the bottom of the plug. 6. Turn the device upside down and gently squeeze. 7. Empty all of the drainage into the measuring cup. 8. Compress the bulb or the container and replace the cap or the plug. To compress the bulb or the container, squeeze it firmly in the middle while you close the cap or plug the container. 9. Write down the amount of drainage that you have in each 24-hour period. If you have less than 2 Tbsp (30 mL) of drainage during 24 hours, contact your health care provider. 10. Flush the drainage down the toilet. 11. Wash your hands with soap and water. Contact a health care provider if:  You have redness, swelling, or pain around your drain area.  You have pus or a bad smell coming from your drain area.  You have a fever or chills.  The skin around your drain is warm to the touch.  The amount of drainage that you have is increasing instead of decreasing.  You have drainage that is cloudy.  There is a sudden stop or a sudden decrease in the amount of drainage that you have.  Your drain tube falls out.  Your active drain does not stay  compressed after you empty it. Summary  Surgical drains are used to remove extra fluid that normally builds up in a surgical wound after surgery.  Different kinds of surgical drains include active drains and passive drains. Active drains use suction to pull drainage away from the surgical wound, and passive drains allow fluid to drain naturally.  It is important to care for your drain to prevent infection. If your drain is placed at your back, or any other hard-to-reach area, ask another person to assist you.  Contact your health care provider if you have redness, swelling, or pain around your drain area. This information is not intended to replace advice given to you by your health care provider. Make sure you discuss any questions you have with your health care provider. Document Revised: 02/19/2018 Document Reviewed: 02/19/2018 Elsevier Patient Education  2020 [employer].

## 2019-05-12 LAB — BASIC METABOLIC PANEL
Anion gap: 11 (ref 5–15)
BUN: 14 mg/dL (ref 6–20)
CO2: 24 mmol/L (ref 22–32)
Calcium: 8 mg/dL — ABNORMAL LOW (ref 8.9–10.3)
Chloride: 100 mmol/L (ref 98–111)
Creatinine, Ser: 1.14 mg/dL — ABNORMAL HIGH (ref 0.44–1.00)
GFR calc Af Amer: >60 mL/min (ref >60)
GFR calc non Af Amer: 57 mL/min — ABNORMAL LOW (ref >60)
Glucose, Bld: 209 mg/dL — ABNORMAL HIGH (ref 70–99)
Potassium: 3.9 mmol/L (ref 3.5–5.1)
Sodium: 135 mmol/L (ref 135–145)

## 2019-05-12 LAB — CBC
HCT: 30.4 % — ABNORMAL LOW (ref 36.0–46.0)
Hemoglobin: 9.1 g/dL — ABNORMAL LOW (ref 12.0–15.0)
MCH: 28 pg (ref 26.0–34.0)
MCHC: 29.9 g/dL — ABNORMAL LOW (ref 30.0–36.0)
MCV: 93.5 fL (ref 80.0–100.0)
Platelets: 331 10*3/uL (ref 150–400)
RBC: 3.25 MIL/uL — ABNORMAL LOW (ref 3.87–5.11)
RDW: 13.3 % (ref 11.5–15.5)
WBC: 14.7 10*3/uL — ABNORMAL HIGH (ref 4.0–10.5)
nRBC: 0 % (ref 0.0–0.2)

## 2019-05-12 LAB — GLUCOSE, CAPILLARY
Glucose-Capillary: 236 mg/dL — ABNORMAL HIGH (ref 70–99)
Glucose-Capillary: 258 mg/dL — ABNORMAL HIGH (ref 70–99)

## 2019-05-12 LAB — SURGICAL PATHOLOGY

## 2019-05-12 MED ORDER — CYCLOBENZAPRINE HCL 10 MG PO TABS: 10.0000 mg | ORAL_TABLET | Freq: Three times a day (TID) | ORAL | 0 refills | Status: DC | PRN

## 2019-05-12 MED ORDER — INSULIN NPH (HUMAN) (ISOPHANE) 100 UNIT/ML ~~LOC~~ SUSP: 10.0000 [IU] | Freq: Two times a day (BID) | SUBCUTANEOUS | 1 refills | Status: DC

## 2019-05-12 MED ORDER — POLYETHYLENE GLYCOL 3350 17 G PO PACK: 17.0000 g | PACK | Freq: Every day | ORAL | 0 refills | Status: AC | PRN

## 2019-05-12 MED ORDER — INSULIN REGULAR HUMAN 100 UNIT/ML IJ SOLN: 3.0000 [IU] | Freq: Three times a day (TID) | INTRAMUSCULAR | 0 refills | Status: DC

## 2019-05-12 MED ORDER — AMOXICILLIN-POT CLAVULANATE 875-125 MG PO TABS: 1.0000 | ORAL_TABLET | Freq: Two times a day (BID) | ORAL | 0 refills | Status: AC

## 2019-05-12 NOTE — Progress Notes (Signed)
[facility] Progress Note  3 Days Post-Op  Subjective: CC-  Abdomen is still sore but pain is better controlled today. Flexeril helped with muscle spasms. Appetite is suppressed but she is tolerating what she is eating. Started passing a lot of gas this morning, feels like she may have a bowel movement. Has been slowly sipping on magnesium citrate.   Objective: Vital signs in last 24 hours: Temp:  [97.4 F (36.3 C)-98.9 F (37.2 C)] 98.9 F (37.2 C) (04/13 0606) Pulse Rate:  [99-110] 102 (04/13 0606) Resp:  [14-18] 17 (04/13 0606) BP: (98-114)/(54-64) 103/59 (04/13 0606) SpO2:  [90 %-100 %] 96 % (04/13 0606) Last BM Date: 05/08/19  Intake/Output from previous day: 04/12 0701 - 04/13 0700 In: 2638.1 [P.O.:720; I.V.:869.3; IV Piggyback:1048.8] Out: 1710 [Urine:1650; Drains:60] Intake/Output this shift: No intake/output data recorded.  PE: Gen:  Alert, NAD, pleasant HEENT: EOM's intact, pupils equal and round Card:  RRR Pulm:  CTAB, no W/R/R, rate and effort normal Abd: obese, soft, mild TTP around drain, + BS, lap incisions cdi, drain with trace serosanguinous fluid in bulb   Lab Results:  Recent Labs    05/11/19 0741 05/12/19 0459  WBC 17.2* 14.7*  HGB 9.0* 9.1*  HCT 29.2* 30.4*  PLT 281 331   BMET Recent Labs    05/11/19 0741 05/12/19 0459  NA 133* 135  K 3.9 3.9  CL 98 100  CO2 24 24  GLUCOSE 232* 209*  BUN 16 14  CREATININE 1.16* 1.14*  CALCIUM 7.4* 8.0*   PT/INR No results for input(s): LABPROT, INR in the last 72 hours. CMP     Component Value Date/Time   NA 135 05/12/2019 0459   K 3.9 05/12/2019 0459   CL 100 05/12/2019 0459   CO2 24 05/12/2019 0459   GLUCOSE 209 (H) 05/12/2019 0459   BUN 14 05/12/2019 0459   CREATININE 1.14 (H) 05/12/2019 0459   CALCIUM 8.0 (L) 05/12/2019 0459   PROT 5.9 (L) 05/11/2019 0741   ALBUMIN 2.3 (L) 05/11/2019 0741   AST 22 05/11/2019 0741   ALT 21 05/11/2019 0741   ALKPHOS 80 05/11/2019 0741   BILITOT 0.8 05/11/2019 0741   GFRNONAA 57 (L) 05/12/2019 0459   GFRAA >60 05/12/2019 0459   Lipase     Component Value Date/Time   LIPASE 24 05/08/2019 2318       Studies/Results: No results found.  Anti-infectives: Anti-infectives (From admission, onward)   Start     Dose/Rate Route Frequency Ordered Stop   05/09/19 1800  piperacillin-tazobactam (ZOSYN) IVPB 3.375 g     3.375 g 12.5 mL/hr over 240 Minutes Intravenous Every 8 hours 05/09/19 1245     05/09/19 1245  piperacillin-tazobactam (ZOSYN) IVPB 3.375 g  Status:  Discontinued     3.375 g 100 mL/hr over 30 Minutes Intravenous  Once 05/09/19 1243 05/09/19 1245   05/09/19 0930  ceFAZolin (ANCEF) 3 g in dextrose 5 % 50 mL IVPB     3 g 100 mL/hr over 30 Minutes Intravenous On call to O.R. 05/09/19 0940 05/09/19 1009   05/09/19 0930  metroNIDAZOLE (FLAGYL) IVPB 500 mg     500 mg 100 mL/hr over 60 Minutes Intravenous On call to O.R. 05/09/19 7680 05/09/19 1023   05/09/19 0600  cefTRIAXone (ROCEPHIN) 2 g in sodium chloride 0.9 % 100 mL IVPB     2 g 200 mL/hr over 30 Minutes Intravenous  Once 05/09/19 0550 05/09/19 0705       Assessment/Plan  IDDM - DM coordinator to see HTN HLD Depression Morbid obesity ABL anemia - Hgb 9.1, stable AKI - Cr 1.14 from 1.16, stable  AcuteNecroticon Chronic Calculus Cholecystitis S/p laparoscopic cholecystectomy 4/10 Dr. Johney Maine - POD#3 - needs 5 days abx postop - JP drain output serosanguinous - Will recheck this afternoon for possible discharge pending good pain control on oral regimen, continued bowel function, and DM coordinator recommendations.  ID - zosyn 4/10>>day#4 FEN - KVO IVF. CM diet VTE - SCDs, lovenox Foley - none Follow up - 1 week nurse clinic for drain check   LOS: 3 days    Wellington Hampshire, [facility] 05/12/2019, 9:17 AM Please see Amion for pager number during day hours 7:00am-4:30pm

## 2019-05-12 NOTE — Care Management Important Message (Signed)
Important Message  Patient Details IM Letter given to [place] Case Manager to present to the Patient Name: Summer Lopez MRN: [id number] Date of Birth: April 21, 1970   Medicare Important Message Given:  Yes     Kerin Salen 05/12/2019, 10:48 AM

## 2019-05-12 NOTE — Discharge Summary (Signed)
[facility] Discharge Summary   Patient ID: Summer Lopez MRN: [id number] DOB/AGE: 07-12-1970 49 y.o.  Admit date: 05/09/2019 Discharge date: 05/12/2019  Admitting Diagnosis: Acute cholecystitis  Discharge Diagnosis Patient Active Problem List   Diagnosis Date Noted  . Acute calculous cholecystitis 05/09/2019  . Hx of laparoscopic gastric banding 05/09/2019  . 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 Degenerative spondylolisthesis 08/13/2017  . Anxiety 12/30/2015  . Type 2 diabetes mellitus with hyperglycemia ([place]) 04/21/2013  . 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 Morbid obesity ([place]) 03/06/2013  . Alopecia 12/22/2012  . Disorder of thyroid 11/26/2012  . Poorly controlled diabetes mellitus ([place]) 08/26/2012  . Accumulation of fluid in tissues 08/11/2012  . Essential hypertension 04/09/2012  . Post menopausal syndrome 04/09/2012  . Pain in joint, ankle and foot 12/17/2011  . 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 Posterior tibial tendon dysfunction 11/14/2011  . ARTHROSCOPY, LEFT KNEE, HX OF 03/13/2010  . Anemia 02/07/2010  . DYSPEPSIA 02/07/2010  . Other complications of other bariatric procedure 02/07/2010  . CONSTIPATION 02/07/2010  . RECTAL BLEEDING 02/07/2010  . OSTEOARTHRITIS, KNEE, LEFT 02/07/2010  . Arthropathy 02/07/2010  . FIBROMYALGIA 02/07/2010  . HEADACHE, CHRONIC 02/07/2010  . NAUSEA AND VOMITING 02/07/2010  . LUQ PAIN 02/07/2010  . MEDIAL MENISCUS TEAR, LEFT 02/07/2010  . MRSA 02/02/2010  . Hypothyroidism 02/02/2010  . Insulin-requiring or dependent type II diabetes mellitus ([place]) 02/02/2010  . Vitamin D deficiency 02/02/2010  . OBESITY 02/02/2010  . DEPRESSION 02/02/2010  . Chronic pain disorder 02/02/2010  . GERD 02/02/2010  . Constipation 02/02/2010  . CLOSED FRACTURE OF ASTRAGALUS 01/17/2009  . BUCKET HANDLE TEAR OF LATERAL MENISCUS 12/01/2008  . TEAR LATERAL MENISCUS 12/01/2008  . CARPAL  TUNNEL SYNDROME 09/30/2008  . DERANGEMENT OF ANTERIOR HORN OF LATERAL MENISCUS 07/29/2008  . TEAR M C L 05/03/2008  . H N P-LUMBAR 03/31/2008  . Low back pain 03/31/2008  . JOINT EFFUSION, RIGHT KNEE 01/12/2008  . KNEE PAIN 12/10/2007  . PLANTAR FACIITIS 08/19/2007    Consultants None  Imaging: No results found.  Procedures Dr. Johney Maine (05/09/2019) - Laparoscopic Cholecystectomy, lysis of adhesions  Hospital Course:  Summer Lopez is a 49yo female PMH IDDM and prior history of bariatric surgery, who presented to [facility]10 with 1 week of intermittent crampy abdominal pain, nausea, and vomiting.  Workup included CT scan which was concerning for calculous cholecystitis.  Patient was admitted and underwent procedure listed above.  Intraoperatively she was noted to have a very large gallbladder turgid with acute on chronic inflammation; drain was left in place. Tolerated procedure well and was transferred to the floor.  She was kept on IV zosyn while admitted. Pain control initially difficult due to history of chronic narcotic use, but this improved with multimodal therapies and time. She was also noted to be hyperglycemic and the diabetes coordinator was consulted for assistance.  On POD3 the patient was voiding well, tolerating diet, ambulating well, pain well controlled, vital signs stable, incisions c/d/i and felt stable for discharge home.  She will go home with 2 days of augmentin to complete a 5 day course of antibiotics postoperatively. She will follow up in our office next week for drain check and likely removal; no bile noted in drain at time of discharge. Patient will follow up as below and knows to call with questions or concerns.    I have personally reviewed the patients medication history on the [place] controlled substance database.  Allergies as of 05/12/2019      Reactions   Meloxicam Other (See Comments)   GI Upset-Ulcers   Adhesive [tape] Rash, Other (See Comments)   Prefers  paper tape, adhesive tape burns her skin   Morphine Nausea And Vomiting      Medication List    STOP taking these medications   diazepam 5 MG tablet Commonly known as: VALIUM   Oxycodone HCl 10 MG Tabs     TAKE these medications   Accu-Chek Softclix Lancets lancets Use lancets to check blood sugar tid   amitriptyline 50 MG tablet Commonly known as: ELAVIL Take 50 mg by mouth at bedtime.   amoxicillin-clavulanate 875-125 MG tablet Commonly known as: Augmentin Take 1 tablet by mouth 2 (two) times daily for 2 days.   atorvastatin 10 MG tablet Commonly known as: LIPITOR Take 10 mg by mouth daily.   buPROPion 300 MG 24 hr tablet Commonly known as: WELLBUTRIN XL Take 300 mg by mouth daily.   clonazePAM 1 MG tablet Commonly known as: KLONOPIN Take 1 mg by mouth 3 (three) times daily as needed for anxiety.   clotrimazole 1 % cream Commonly known as: LOTRIMIN Apply 1 application topically daily as needed (fungal skin condition).   cyclobenzaprine 10 MG tablet Commonly known as: FLEXERIL Take 1 tablet (10 mg total) by mouth 3 (three) times daily as needed for muscle spasms. What changed: when to take this   DULoxetine 60 MG capsule Commonly known as: CYMBALTA Take 60 mg by mouth 2 (two) times daily.   estradiol 1 MG tablet Commonly known as: ESTRACE Take 1 mg by mouth daily.   Euthyrox 125 MCG tablet Generic drug: levothyroxine Take 125 mcg by mouth daily.   ferrous gluconate 324 MG tablet Commonly known as: FERGON Take 324 mg by mouth daily.   furosemide 20 MG tablet Commonly known as: LASIX Take 20 mg by mouth daily.   insulin NPH Human 100 UNIT/ML injection Commonly known as: NovoLIN N Inject 0.1 mLs (10 Units total) into the skin 2 (two) times daily before a meal.   insulin regular 100 units/mL injection Commonly known as: NOVOLIN R Inject 0.03 mLs (3 Units total) into the skin 3 (three) times daily with meals. What changed:   how much to  take  when to take this  additional instructions   INSULIN SYRINGE 1CC/31GX5/16" 31G X 5/16" 1 ML Misc by Does not apply route.   lisinopril 5 MG tablet Commonly known as: ZESTRIL Take 5 mg by mouth daily.   metFORMIN 1000 MG tablet Commonly known as: GLUCOPHAGE Take 1,000 mg by mouth 2 (two) times daily.   metoprolol succinate 25 MG 24 hr tablet Commonly known as: TOPROL-XL Take 25 mg by mouth daily.   Omega-3 500 MG Caps Take 500 mg by mouth daily.   omeprazole 20 MG capsule Commonly known as: PRILOSEC Take 20 mg by mouth daily.   ondansetron 4 MG disintegrating tablet Commonly known as: ZOFRAN-ODT Take 4 mg by mouth every 8 (eight) hours as needed for nausea/vomiting.   polyethylene glycol 17 g packet Commonly known as: MIRALAX / GLYCOLAX Take 17 g by mouth daily as needed for mild constipation.   RA Blood Glucose Monitor Devi by Does not apply route.        Follow-up [facility], [place]. Go on 05/19/2019.   Specialty: General Surgery Why: Your appointment is 4/20 at 10am Please arrive 30 minutes prior to your appointment to  check in and fill out paperwork. Bring photo ID and insurance information. Contact information: [address] [place], [place], PA-C. Go on 05/28/2019.   Specialty: General Surgery Why: Your appointment is 4/29 at 3:45pm Please arrive 15 minutes early to check in Contact information: [place] [address] [phone]        Berkley Harvey, NP. Schedule an appointment as soon as possible for a visit in 1 week(s).   Specialty: Nurse Practitioner Why: call to arrange post-hospitalization follow up appointment with your primary care physician Contact information: [place] [address] [phone]           Signed: Wellington Hampshire, [facility] 05/12/2019, 2:30  PM Please see Amion for pager number during day hours 7:00am-4:30pm

## 2019-05-12 NOTE — Progress Notes (Signed)
Discharge instructions given to pt and all questions were answered.  

## 2019-05-12 NOTE — Progress Notes (Signed)
Inpatient Diabetes Program Recommendations  AACE/ADA: New Consensus Statement on Inpatient Glycemic Control (2015)  Target Ranges:  Prepandial:   less than 140 mg/dL      Peak postprandial:   less than 180 mg/dL (1-2 hours)      Critically ill patients:  140 - 180 mg/dL   Lab Results  Component Value Date   GLUCAP 236 (H) 05/12/2019   HGBA1C 9.8 (H) 05/08/2019    Review of Glycemic Control  Diabetes history: DM2 Outpatient Diabetes medications: Novolin R s/s, metformin 1000 mg bid Current orders for Inpatient glycemic control: Novolog 0-20 units tidwc and hs  HgbA1C - 9.8% - needs tighter glycemic control  Spoke with pt on 4/12 regarding her HgbA1C of 9.8%. Pt states she has lost approx 70 pounds over the past 2 years and HgbA1C is down from 12.9%. I discussed possibility of adding basal insulin for tighter control, or adding meal coverage insulin with meals. Pt checks blood sugars daily. States AM blood sugars is usually good and then they continue to climb throughout the day. May benefit from adding Novolin N bid.  Inpatient Diabetes Program Recommendations:    For discharge:  Novolin N 10 units bid Novolin R s/s + 3 units tidwc (with eating normal meal)  Pt to monitor blood sugars at least 4x/day and f/u with PCP within a week of discharge.   Will continue to follow.  Thank you. Lorenda Peck, RD, LDN, CDE Inpatient Diabetes Coordinator [phone]

## 2019-09-24 ENCOUNTER — Other Ambulatory Visit: Payer: Self-pay | Admitting: Family Medicine

## 2019-09-24 DIAGNOSIS — M545 Low back pain, unspecified: Secondary | ICD-10-CM

## 2019-10-02 ENCOUNTER — Ambulatory Visit
Admission: RE | Admit: 2019-10-02 | Discharge: 2019-10-02 | Disposition: A | Discharge status: Home / Self Care | Payer: Medicare Other | Source: Ambulatory Visit | Attending: Family Medicine | Admitting: Family Medicine

## 2019-10-02 ENCOUNTER — Other Ambulatory Visit: Payer: Self-pay

## 2019-10-02 DIAGNOSIS — M545 Low back pain, unspecified: Secondary | ICD-10-CM

## 2019-10-02 MED ORDER — IOPAMIDOL (ISOVUE-M 200) INJECTION 41%
1.0000 mL | Freq: Once | INTRAMUSCULAR | Status: AC
  Administered 2019-10-02: 1 mL via EPIDURAL

## 2019-10-02 MED ORDER — METHYLPREDNISOLONE ACETATE 40 MG/ML INJ SUSP (RADIOLOG
120.0000 mg | Freq: Once | INTRAMUSCULAR | Status: AC
  Administered 2019-10-02: 120 mg via EPIDURAL

## 2020-01-08 ENCOUNTER — Other Ambulatory Visit: Payer: Self-pay | Admitting: Nurse Practitioner

## 2020-01-08 DIAGNOSIS — Z1231 Encounter for screening mammogram for malignant neoplasm of breast: Secondary | ICD-10-CM

## 2020-01-11 ENCOUNTER — Other Ambulatory Visit: Payer: Self-pay

## 2020-01-11 ENCOUNTER — Ambulatory Visit: Payer: Medicare Other

## 2020-01-11 ENCOUNTER — Ambulatory Visit
Admission: RE | Admit: 2020-01-11 | Discharge: 2020-01-11 | Disposition: A | Discharge status: Home / Self Care | Payer: Medicare Other | Source: Ambulatory Visit | Attending: Nurse Practitioner | Admitting: Nurse Practitioner

## 2020-01-11 DIAGNOSIS — Z1231 Encounter for screening mammogram for malignant neoplasm of breast: Secondary | ICD-10-CM

## 2020-07-13 ENCOUNTER — Other Ambulatory Visit: Payer: Self-pay | Admitting: Family Medicine

## 2020-07-13 DIAGNOSIS — M545 Low back pain, unspecified: Secondary | ICD-10-CM

## 2020-07-18 ENCOUNTER — Ambulatory Visit
Admission: RE | Admit: 2020-07-18 | Discharge: 2020-07-18 | Disposition: A | Discharge status: Home / Self Care | Payer: Medicare Other | Source: Ambulatory Visit | Attending: Family Medicine | Admitting: Family Medicine

## 2020-07-18 ENCOUNTER — Other Ambulatory Visit: Payer: Self-pay

## 2020-07-18 DIAGNOSIS — M545 Low back pain, unspecified: Secondary | ICD-10-CM

## 2020-07-18 DIAGNOSIS — G8929 Other chronic pain: Secondary | ICD-10-CM

## 2020-07-18 MED ORDER — IOPAMIDOL (ISOVUE-M 200) INJECTION 41%
1.0000 mL | Freq: Once | INTRAMUSCULAR | Status: AC
  Administered 2020-07-18: 1 mL via EPIDURAL

## 2020-07-18 MED ORDER — METHYLPREDNISOLONE ACETATE 40 MG/ML INJ SUSP (RADIOLOG
80.0000 mg | Freq: Once | INTRAMUSCULAR | Status: AC
  Administered 2020-07-18: 80 mg via EPIDURAL

## 2020-07-18 NOTE — Discharge Instructions (Signed)
Post Procedure Spinal Discharge Instruction Sheet  You may resume a regular diet and any medications that you routinely take (including pain medications).  No driving day of procedure.  Light activity throughout the rest of the day.  Do not do any strenuous work, exercise, bending or lifting.  The day following the procedure, you can resume normal physical activity but you should refrain from exercising or physical therapy for at least three days thereafter.   Common Side Effects:  Headaches- take your usual medications as directed by your physician.  Increase your fluid intake.  Caffeinated beverages may be helpful.  Lie flat in bed until your headache resolves.  Restlessness or inability to sleep- you may have trouble sleeping for the next few days.  Ask your referring physician if you need any medication for sleep.  Facial flushing or redness- should subside within a few days.  Increased pain- a temporary increase in pain a day or two following your procedure is not unusual.  Take your pain medication as prescribed by your referring physician.  Leg cramps  Please contact our office at [phone] for the following symptoms: Fever greater than 100 degrees. Headaches unresolved with medication after 2-3 days. Increased swelling, pain, or redness at injection site.   Thank you for visiting [facility] today.

## 2021-02-16 ENCOUNTER — Other Ambulatory Visit: Payer: Self-pay | Admitting: Nurse Practitioner

## 2021-02-16 DIAGNOSIS — Z1231 Encounter for screening mammogram for malignant neoplasm of breast: Secondary | ICD-10-CM

## 2021-02-24 ENCOUNTER — Ambulatory Visit
Admission: RE | Admit: 2021-02-24 | Discharge: 2021-02-24 | Disposition: A | Discharge status: Home / Self Care | Payer: Medicare Other | Source: Ambulatory Visit

## 2021-02-24 DIAGNOSIS — Z1231 Encounter for screening mammogram for malignant neoplasm of breast: Secondary | ICD-10-CM

## 2021-04-17 ENCOUNTER — Ambulatory Visit: Payer: Medicare Other | Admitting: Gastroenterology

## 2022-01-09 ENCOUNTER — Other Ambulatory Visit: Payer: Self-pay | Admitting: Nurse Practitioner

## 2022-01-09 DIAGNOSIS — M48061 Spinal stenosis, lumbar region without neurogenic claudication: Secondary | ICD-10-CM

## 2022-01-11 ENCOUNTER — Ambulatory Visit
Admission: RE | Admit: 2022-01-11 | Discharge: 2022-01-11 | Disposition: A | Discharge status: Home / Self Care | Payer: BC Managed Care – PPO | Source: Ambulatory Visit | Attending: Nurse Practitioner | Admitting: Nurse Practitioner

## 2022-01-11 DIAGNOSIS — M48061 Spinal stenosis, lumbar region without neurogenic claudication: Secondary | ICD-10-CM

## 2022-01-11 MED ORDER — METHYLPREDNISOLONE ACETATE 40 MG/ML INJ SUSP (RADIOLOG
80.0000 mg | Freq: Once | INTRAMUSCULAR | Status: AC
  Administered 2022-01-11: 80 mg via EPIDURAL

## 2022-01-11 MED ORDER — IOPAMIDOL (ISOVUE-M 200) INJECTION 41%
1.0000 mL | Freq: Once | INTRAMUSCULAR | Status: AC
  Administered 2022-01-11: 1 mL via EPIDURAL

## 2022-01-11 NOTE — Discharge Instructions (Signed)

## 2022-04-02 ENCOUNTER — Other Ambulatory Visit: Payer: Self-pay | Admitting: Nurse Practitioner

## 2022-04-02 DIAGNOSIS — Z1231 Encounter for screening mammogram for malignant neoplasm of breast: Secondary | ICD-10-CM

## 2022-04-04 ENCOUNTER — Ambulatory Visit
Admission: RE | Admit: 2022-04-04 | Discharge: 2022-04-04 | Disposition: A | Discharge status: Home / Self Care | Payer: BC Managed Care – PPO | Source: Ambulatory Visit | Attending: Nurse Practitioner | Admitting: Nurse Practitioner

## 2022-04-04 DIAGNOSIS — Z1231 Encounter for screening mammogram for malignant neoplasm of breast: Secondary | ICD-10-CM

## 2022-04-06 ENCOUNTER — Other Ambulatory Visit: Payer: Self-pay | Admitting: Nurse Practitioner

## 2022-04-06 DIAGNOSIS — R928 Other abnormal and inconclusive findings on diagnostic imaging of breast: Secondary | ICD-10-CM

## 2022-04-20 ENCOUNTER — Ambulatory Visit
Admission: RE | Admit: 2022-04-20 | Discharge: 2022-04-20 | Disposition: A | Discharge status: Home / Self Care | Payer: BC Managed Care – PPO | Source: Ambulatory Visit | Attending: Nurse Practitioner | Admitting: Nurse Practitioner

## 2022-04-20 ENCOUNTER — Ambulatory Visit: Admission: RE | Admit: 2022-04-20 | Payer: BC Managed Care – PPO | Source: Ambulatory Visit

## 2022-04-20 DIAGNOSIS — R928 Other abnormal and inconclusive findings on diagnostic imaging of breast: Secondary | ICD-10-CM

## 2022-05-18 ENCOUNTER — Encounter: Payer: Self-pay | Admitting: Gastroenterology

## 2022-05-18 ENCOUNTER — Ambulatory Visit (AMBULATORY_SURGERY_CENTER): Payer: BC Managed Care – PPO

## 2022-05-18 VITALS — Ht 67.0 in | Wt 281.0 lb

## 2022-05-18 DIAGNOSIS — Z1211 Encounter for screening for malignant neoplasm of colon: Secondary | ICD-10-CM

## 2022-05-18 MED ORDER — NA SULFATE-K SULFATE-MG SULF 17.5-3.13-1.6 GM/177ML PO SOLN: 1.0000 | Freq: Once | ORAL | 0 refills | Status: AC

## 2022-05-18 NOTE — Progress Notes (Signed)
No egg or soy allergy known to patient;  No issues known to pt with past sedation with any surgeries or procedures; Patient denies ever being told they had issues or difficulty with intubation;  No FH of Malignant Hyperthermia; Pt is not on diet pills; Pt is not on home 02;  Pt is not on blood thinners;  Pt reports issues with constipation-patient reports she takes Miralax as needed; patient  No A fib or A flutter; Have any cardiac testing pending--NO Pt instructed to use Singlecare.com or GoodRx for a price reduction on prep;   Insurance verified during PV appt=[employer] [place]  Patient's chart reviewed by Cathlyn Parsons CNRA prior to previsit and patient appropriate for the [facility].  Previsit completed and red dot placed by patient's name on their procedure day (on provider's schedule).    Printed instructions for patient as well as a GoodRx coupon for use at the pharmacy for a price reduction for prep;

## 2022-06-01 ENCOUNTER — Encounter: Payer: Self-pay | Admitting: Gastroenterology

## 2022-06-01 ENCOUNTER — Ambulatory Visit (AMBULATORY_SURGERY_CENTER): Payer: BC Managed Care – PPO | Admitting: Gastroenterology

## 2022-06-01 VITALS — BP 122/74 | HR 83 | Temp 98.1°F | Resp 16 | Ht 67.0 in | Wt 281.0 lb

## 2022-06-01 DIAGNOSIS — D122 Benign neoplasm of ascending colon: Secondary | ICD-10-CM | POA: Diagnosis not present

## 2022-06-01 DIAGNOSIS — Z1211 Encounter for screening for malignant neoplasm of colon: Secondary | ICD-10-CM

## 2022-06-01 MED ORDER — SODIUM CHLORIDE 0.9 % IV SOLN
500.0000 mL | Freq: Once | INTRAVENOUS | Status: AC
Start: 2022-06-01 — End: ?

## 2022-06-01 NOTE — Progress Notes (Signed)
Vss nad trans to pacu 

## 2022-06-01 NOTE — Op Note (Signed)
[facility] Patient Name: Summer Lopez Procedure Date: 06/01/2022 7:49 AM MRN: [id number] Endoscopist: Lorin Picket E. Tomasa Rand , MD, [id number] Age: 52 Referring MD:  Date of Birth: 1970-05-25 Gender: Female Account #: [id number] Procedure:                Colonoscopy Indications:              Screening for colorectal malignant neoplasm (last                            colonoscopy was more than 10 years ago) Medicines:                Monitored Anesthesia Care Procedure:                Pre-Anesthesia Assessment:                           - Prior to the procedure, a History and Physical                            was performed, and patient medications and                            allergies were reviewed. The patient's tolerance of                            previous anesthesia was also reviewed. The risks                            and benefits of the procedure and the sedation                            options and risks were discussed with the patient.                            All questions were answered, and informed consent                            was obtained. Prior Anticoagulants: The patient has                            taken no anticoagulant or antiplatelet agents. ASA                            Grade Assessment: III - A patient with severe                            systemic disease. After reviewing the risks and                            benefits, the patient was deemed in satisfactory                            condition to undergo the procedure.  After obtaining informed consent, the colonoscope                            was passed under direct vision. Throughout the                            procedure, the patient's blood pressure, pulse, and                            oxygen saturations were monitored continuously. The                            Olympus CF-HQ190L [id number]) Colonoscope was                            introduced  through the anus and advanced to the the                            cecum, identified by appendiceal orifice and                            ileocecal valve. The colonoscopy was unusually                            difficult due to a redundant colon, significant                            looping and a tortuous colon. Successful completion                            of the procedure was aided by using manual pressure                            and withdrawing and reinserting the scope. The                            patient tolerated the procedure well. The quality                            of the bowel preparation was fair. The ileocecal                            valve, appendiceal orifice, and rectum were                            photographed. The entire colon was examined. The                            bowel preparation used was SUPREP via split dose                            instruction. Scope In: 7:59:13 AM Scope Out: 8:33:22 AM Scope Withdrawal Time: 0 hours 12 minutes 40 seconds  Total Procedure Duration:  0 hours 34 minutes 9 seconds  Findings:                 The perianal and digital rectal examinations were                            normal. Pertinent negatives include normal                            sphincter tone and no palpable rectal lesions.                           A 7 mm polyp was found in the ascending colon. The                            polyp was flat. The polyp was removed with a cold                            snare. Resection and retrieval were complete.                            Estimated blood loss was minimal.                           The exam was otherwise normal throughout the                            examined colon.                           The retroflexed view of the distal rectum and anal                            verge was normal and showed no anal or rectal                            abnormalities. Complications:            No immediate  complications. Estimated Blood Loss:     Estimated blood loss was minimal. Impression:               - Preparation of the colon was fair.                           - One 7 mm polyp in the ascending colon, removed                            with a cold snare. Resected and retrieved.                           - The distal rectum and anal verge are normal on                            retroflexion view.                           -  The GI Genius (intelligent endoscopy module),                            computer-aided polyp detection system powered by AI                            was utilized to detect colorectal polyps through                            enhanced visualization during colonoscopy. Recommendation:           - Patient has a contact number available for                            emergencies. The signs and symptoms of potential                            delayed complications were discussed with the                            patient. Return to normal activities tomorrow.                            Written discharge instructions were provided to the                            patient.                           - Resume previous diet.                           - Continue present medications.                           - Await pathology results.                           - Repeat colonoscopy in 3 years because the bowel                            preparation was suboptimal.                           - Recommend 2-day bowel prep with next colonoscopy Gerren Hoffmeier E. Tomasa Rand, MD 06/01/2022 8:42:58 AM This report has been signed electronically.

## 2022-06-01 NOTE — Progress Notes (Signed)
Pt's states no medical or surgical changes since previsit or office visit. VS assessed by C.W 

## 2022-06-01 NOTE — Progress Notes (Signed)
[facility] History and Physical   Primary Care Physician:  Iona Hansen, NP   Reason for Procedure:   Colon cancer screening  Plan:    Screening colonoscopy     HPI: Summer Lopez is a 52 y.o. female undergoing average risk screening colonoscopy.  She has no family history of colon cancer and no chronic GI symptoms other than chronic constipation.  She reports having a colonoscopy over 10 years ago and believes that she had polyps removed.   Past Medical History:  Diagnosis Date   Anemia    on meds   Anxiety    on meds   Arthritis    generalized   Cataract    bilateral eyes/not a surgical candidate at this time (05/18/2022)   Depression    on meds   Diabetes mellitus    on meds   Enlarged heart    denies   Hyperlipidemia    on meds   Hypertension    on meds   Migraine    Seasonal allergies    Thyroid disease    on meds    Past Surgical History:  Procedure Laterality Date   ABDOMINAL HYSTERECTOMY     BREAST BIOPSY Right 2012   CHOLECYSTECTOMY N/A 05/09/2019   Procedure: LAPAROSCOPIC CHOLECYSTECTOMY.;  Surgeon: Karie Soda, MD;  Location: [facility];  Service: General;  Laterality: N/A;   DILATION AND CURETTAGE OF UTERUS  1995   GASTRIC BYPASS  2000   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 Authorizing Provider  ACCU-CHEK SOFTCLIX LANCETS lancets Use lancets to check blood sugar tid 04/09/14  Yes [provider]  amitriptyline (ELAVIL) 50 MG tablet Take 50 mg by mouth at bedtime.    Yes [provider]  amLODipine (NORVASC) 5 MG tablet Take 1 tablet by mouth daily. 12/27/21  Yes [provider]  atorvastatin (LIPITOR) 10 MG tablet Take 10 mg by mouth  daily. 06/19/17  Yes [provider]  B-D 3CC LUER-LOK SYR 25GX1" 25G X 1" 3 ML MISC 1 each by Misc.(Non-Drug; Combo Route) route every 14 days. 03/05/21  Yes [provider]  B-D ULTRAFINE III SHORT PEN 31G X 8 MM MISC Inject into the skin.   Yes [provider]  Blood Glucose Monitoring Suppl (RA BLOOD GLUCOSE MONITOR) DEVI by Does not apply route. 04/09/14  Yes [provider]  buPROPion (WELLBUTRIN XL) 300 MG 24 hr tablet Take 300 mg by mouth daily.   Yes [provider]  clonazePAM (KLONOPIN) 1 MG tablet Take 1 mg by mouth 3 (three) times daily as needed for anxiety.    Yes [provider]  Continuous Glucose Receiver (DEXCOM G6 RECEIVER) DEVI Use as directed for continuous glucose monitoring. 03/28/22  Yes [provider]  DULoxetine (CYMBALTA) 60 MG capsule Take 60 mg by mouth 2 (two) times daily.    Yes [provider]  estradiol (ESTRACE) 1 MG tablet Take 1 mg by mouth daily. 04/12/19  Yes [provider]  EUTHYROX 125 MCG tablet Take 125 mcg by mouth daily. 04/27/19  Yes [provider]  furosemide (LASIX) 20 MG tablet Take  20 mg by mouth daily.  08/02/17  Yes [provider]  HYDROcodone-acetaminophen (NORCO) 10-325 MG tablet Take 1 tablet by mouth every 4 (four) hours as needed for moderate pain or severe pain. 03/08/22  Yes [provider]  Insulin Aspart FlexPen (NOVOLOG) 100 UNIT/ML Inject into the skin 2 (two) times daily with a meal. 11/24/21 11/24/22 Yes [provider]  insulin glargine, 2 Unit Dial, (TOUJEO MAX) 300 UNIT/ML Solostar Pen Inject 35 Units into the skin at bedtime. 01/05/22 01/05/23 Yes [provider]  Insulin Syringe-Needle U-100 (INSULIN SYRINGE 1CC/31GX5/16") 31G X 5/16" 1 ML MISC by Does not apply route. 03/29/14  Yes [provider]  [place] Lanius (OMEGA-3) 500 MG CAPS Take 500 mg by mouth daily.   Yes [provider]  lisinopril  (PRINIVIL,ZESTRIL) 5 MG tablet Take 5 mg by mouth daily.   Yes [provider]  metoprolol succinate (TOPROL-XL) 25 MG 24 hr tablet Take 25 mg by mouth daily.   Yes [provider]  omeprazole (PRILOSEC) 20 MG capsule Take 20 mg by mouth daily. 03/23/19  Yes [provider]  OZEMPIC, 2 MG/DOSE, 8 MG/3ML SOPN Inject 0.75 mLs into the skin once a week. 05/11/22  Yes [provider]  polyethylene glycol (MIRALAX / GLYCOLAX) 17 g packet Take 17 g by mouth daily as needed for mild constipation. 05/12/19  Yes Meuth, Brooke A, PA-C  silver sulfADIAZINE (SILVADENE) 1 % cream Apply 1 Application topically daily as needed. 03/28/22  Yes [provider]  SYRINGE-NEEDLE, DISP, 3 ML (B-D 3CC LUER-LOK SYR 25GX1") 25G X 1" 3 ML MISC 1 each by miscellaneous route every 14 (fourteen) days. 03/05/21  Yes [provider]  albuterol (VENTOLIN HFA) 108 (90 Base) MCG/ACT inhaler Inhale 2 puffs into the lungs every 4 (four) hours as needed for wheezing or shortness of breath. Patient not taking: Reported on 06/01/2022 01/05/22   [provider]  clotrimazole (LOTRIMIN) 1 % cream Apply 1 application topically daily as needed (fungal skin condition).  Patient not taking: Reported on 06/01/2022 05/19/12   [provider]  Continuous Glucose Sensor (GUARDIAN SENSOR 3) MISC Inject 1 sensor to the skin every 10 days for continuous glucose monitoring. Patient not taking: Reported on 06/01/2022 01/12/22 01/12/23  [provider]  Continuous Glucose Transmitter (GUARDIAN LINK 3 TRANSMITTER) MISC Use as directed for continuous glucose monitoring. Reuse transmitter for 90 days then discard and replace. Patient not taking: Reported on 06/01/2022 01/12/22 01/12/23  [provider]  cyanocobalamin (VITAMIN B12) 1000 MCG/ML injection Inject 1,000 mcg into the skin every 30 (thirty) days. 11/08/21 11/08/22  [provider]  ferrous gluconate (FERGON) 324 MG  tablet Take 324 mg by mouth daily. 04/22/19   [provider]  fluconazole (DIFLUCAN) 150 MG tablet Take 150 mg by mouth daily as needed. Patient not taking: Reported on 06/01/2022 02/22/22   [provider]  fluticasone (FLONASE) 50 MCG/ACT nasal spray Place 2 sprays into both nostrils daily as needed for allergies or rhinitis. Patient not taking: Reported on 06/01/2022 04/18/20   [provider]  hydrochlorothiazide (MICROZIDE) 12.5 MG capsule Take 12.5 mg by mouth daily as needed (edema). Patient not taking: Reported on 06/01/2022 03/08/22   [provider]  ipratropium (ATROVENT) 0.06 % nasal spray Place 2 sprays into the nose 3 (three) times daily as needed for rhinitis. Patient not taking: Reported on 06/01/2022 03/28/22   [provider]  ketoconazole (NIZORAL) 2 % cream Apply 1 Application  topically daily as needed for irritation. Patient not taking: Reported on 06/01/2022    [provider]    Current Outpatient Medications  Medication Sig Dispense Refill   ACCU-CHEK SOFTCLIX LANCETS lancets Use lancets to check blood sugar tid     amitriptyline (ELAVIL) 50 MG tablet Take 50 mg by mouth at bedtime.      amLODipine (NORVASC) 5 MG tablet Take 1 tablet by mouth daily.     atorvastatin (LIPITOR) 10 MG tablet Take 10 mg by mouth daily.  0   B-D 3CC LUER-LOK SYR 25GX1" 25G X 1" 3 ML MISC 1 each by Misc.(Non-Drug; Combo Route) route every 14 days.     B-D ULTRAFINE III SHORT PEN 31G X 8 MM MISC Inject into the skin.     Blood Glucose Monitoring Suppl (RA BLOOD GLUCOSE MONITOR) DEVI by Does not apply route.     buPROPion (WELLBUTRIN XL) 300 MG 24 hr tablet Take 300 mg by mouth daily.     clonazePAM (KLONOPIN) 1 MG tablet Take 1 mg by mouth 3 (three) times daily as needed for anxiety.      Continuous Glucose Receiver (DEXCOM G6 RECEIVER) DEVI Use as directed for continuous glucose monitoring.     DULoxetine (CYMBALTA) 60 MG capsule Take 60 mg by mouth 2  (two) times daily.      estradiol (ESTRACE) 1 MG tablet Take 1 mg by mouth daily.     EUTHYROX 125 MCG tablet Take 125 mcg by mouth daily.     furosemide (LASIX) 20 MG tablet Take 20 mg by mouth daily.   11   HYDROcodone-acetaminophen (NORCO) 10-325 MG tablet Take 1 tablet by mouth every 4 (four) hours as needed for moderate pain or severe pain.     Insulin Aspart FlexPen (NOVOLOG) 100 UNIT/ML Inject into the skin 2 (two) times daily with a meal.     insulin glargine, 2 Unit Dial, (TOUJEO MAX) 300 UNIT/ML Solostar Pen Inject 35 Units into the skin at bedtime.     Insulin Syringe-Needle U-100 (INSULIN SYRINGE 1CC/31GX5/16") 31G X 5/16" 1 ML MISC by Does not apply route.     Krill Oil (OMEGA-3) 500 MG CAPS Take 500 mg by mouth daily.     lisinopril (PRINIVIL,ZESTRIL) 5 MG tablet Take 5 mg by mouth daily.     metoprolol succinate (TOPROL-XL) 25 MG 24 hr tablet Take 25 mg by mouth daily.     omeprazole (PRILOSEC) 20 MG capsule Take 20 mg by mouth daily.     OZEMPIC, 2 MG/DOSE, 8 MG/3ML SOPN Inject 0.75 mLs into the skin once a week.     polyethylene glycol (MIRALAX / GLYCOLAX) 17 g packet Take 17 g by mouth daily as needed for mild constipation. 14 each 0   silver sulfADIAZINE (SILVADENE) 1 % cream Apply 1 Application topically daily as needed.     SYRINGE-NEEDLE, DISP, 3 ML (B-D 3CC LUER-LOK SYR 25GX1") 25G X 1" 3 ML MISC 1 each by miscellaneous route every 14 (fourteen) days.     albuterol (VENTOLIN HFA) 108 (90 Base) MCG/ACT inhaler Inhale 2 puffs into the lungs every 4 (four) hours as needed for wheezing or shortness of breath. (Patient not taking: Reported on 06/01/2022)     clotrimazole (LOTRIMIN) 1 % cream Apply 1 application topically daily as needed (fungal skin condition).  (Patient not taking: Reported on 06/01/2022)     Continuous Glucose Sensor (GUARDIAN SENSOR 3) MISC Inject 1 sensor to the skin every 10 days for  continuous glucose monitoring. (Patient not taking: Reported on 06/01/2022)      Continuous Glucose Transmitter (GUARDIAN LINK 3 TRANSMITTER) MISC Use as directed for continuous glucose monitoring. Reuse transmitter for 90 days then discard and replace. (Patient not taking: Reported on 06/01/2022)     cyanocobalamin (VITAMIN B12) 1000 MCG/ML injection Inject 1,000 mcg into the skin every 30 (thirty) days.     ferrous gluconate (FERGON) 324 MG tablet Take 324 mg by mouth daily.     fluconazole (DIFLUCAN) 150 MG tablet Take 150 mg by mouth daily as needed. (Patient not taking: Reported on 06/01/2022)     fluticasone (FLONASE) 50 MCG/ACT nasal spray Place 2 sprays into both nostrils daily as needed for allergies or rhinitis. (Patient not taking: Reported on 06/01/2022)     hydrochlorothiazide (MICROZIDE) 12.5 MG capsule Take 12.5 mg by mouth daily as needed (edema). (Patient not taking: Reported on 06/01/2022)     ipratropium (ATROVENT) 0.06 % nasal spray Place 2 sprays into the nose 3 (three) times daily as needed for rhinitis. (Patient not taking: Reported on 06/01/2022)     ketoconazole (NIZORAL) 2 % cream Apply 1 Application topically daily as needed for irritation. (Patient not taking: Reported on 06/01/2022)     Current Facility-Administered Medications  Medication Dose Route Frequency Provider Last Rate Last Admin   0.9 %  sodium chloride infusion  500 mL Intravenous Once Jenel Lucks, MD        Allergies as of 06/01/2022 - Review Complete 06/01/2022  Allergen Reaction Noted   Hydromorphone Nausea And Vomiting and Other (See Comments) 07/14/2014   Meloxicam Other (See Comments) and Nausea And Vomiting 05/18/2022   Morphine Nausea And Vomiting and Other (See Comments) 07/06/2011   Adhesive [tape] Rash and Other (See Comments) 08/06/2017    Family History  Problem Relation Age of Onset   Colon polyps Mother 69   Hypertension Mother    Diabetes Father    Hypertension Father    Heart disease Other    Arthritis Other    Breast cancer Neg Hx    Colon cancer Neg Hx     Esophageal cancer Neg Hx    Rectal cancer Neg Hx    Stomach cancer Neg Hx     Social History   Socioeconomic History   Marital status: Married    Spouse name: Not on file   Number of children: Not on file   Years of education: college   Highest education level: Not on file  Occupational History    Employer: UNEMPLOYED  Tobacco Use   Smoking status: Never   Smokeless tobacco: Never  Vaping Use   Vaping Use: Never used  Substance and Sexual Activity   Alcohol use: No   Drug use: No   Sexual activity: Yes    Birth control/protection: None  Other Topics Concern   Not on file  Social History Narrative   Not on file   Social Determinants of Health   Financial Resource Strain: Not on file  Food Insecurity: Not on file  Transportation Needs: Not on file  Physical Activity: Not on file  Stress: Not on file  Social Connections: Not on file  Intimate Partner Violence: Not on file    Review of Systems:  All other review of systems negative except as mentioned in the HPI.  Physical Exam: Vital signs BP (!) 132/54   Pulse 93   Temp 98.1 F (36.7 C) (Skin)   Ht 5\' 7"  (1.702 m)  Wt 281 lb (127.5 kg)   SpO2 99%   BMI 44.01 kg/m   General:   Alert,  Well-developed, well-nourished, pleasant and cooperative in NAD Airway:  Mallampati 2 Lungs:  Clear throughout to auscultation.   Heart:  Regular rate and rhythm; no murmurs, clicks, rubs,  or gallops. Abdomen:  Soft, nontender and nondistended. Normal bowel sounds.   Neuro/Psych:  Normal mood and affect. A and O x 3   Lucillia Corson E. Tomasa Rand, MD [facility]

## 2022-06-01 NOTE — Progress Notes (Signed)
Called to room to assist during endoscopic procedure.  Patient ID and intended procedure confirmed with present staff. Received instructions for my participation in the procedure from the performing physician.  

## 2022-06-01 NOTE — Patient Instructions (Signed)
YOU HAD AN ENDOSCOPIC PROCEDURE TODAY AT THE [facility]:   Refer to the procedure report that was given to you for any specific questions about what was found during the examination.  If the procedure report does not answer your questions, please call your gastroenterologist to clarify.  If you requested that your care partner not be given the details of your procedure findings, then the procedure report has been included in a sealed envelope for you to review at your convenience later.  YOU SHOULD EXPECT: Some feelings of bloating in the abdomen. Passage of more gas than usual.  Walking can help get rid of the air that was put into your GI tract during the procedure and reduce the bloating. If you had a lower endoscopy (such as a colonoscopy or flexible sigmoidoscopy) you may notice spotting of blood in your stool or on the toilet paper. If you underwent a bowel prep for your procedure, you may not have a normal bowel movement for a few days.  Please Note:  You might notice some irritation and congestion in your nose or some drainage.  This is from the oxygen used during your procedure.  There is no need for concern and it should clear up in a day or so.  SYMPTOMS TO REPORT IMMEDIATELY:  Following lower endoscopy (colonoscopy or flexible sigmoidoscopy):  Excessive amounts of blood in the stool  Significant tenderness or worsening of abdominal pains  Swelling of the abdomen that is new, acute  Fever of 100F or higher   For urgent or emergent issues, a gastroenterologist can be reached at any hour by calling [phone]. Do not use MyChart messaging for urgent concerns.    DIET:  We do recommend a small meal at first, but then you may proceed to your regular diet.  Drink plenty of fluids but you should avoid alcoholic beverages for 24 hours.  MEDICATIONS: Continue present medications.  FOLLOW UP: Repeat colonoscopy in 3 years because the bowel preparation was suboptimal.  Recommendn a 2-day bowel prep with the next colonoscopy.  Please see handouts given to you by your recovery nurse: Polyps.  Thank you for allowing [place] to provide for your healthcare needs today.  ACTIVITY:  You should plan to take it easy for the rest of today and you should NOT DRIVE or use heavy machinery until tomorrow (because of the sedation medicines used during the test).    FOLLOW UP: Our staff will call the number listed on your records the next business day following your procedure.  We will call around 7:15- 8:00 am to check on you and address any questions or concerns that you may have regarding the information given to you following your procedure. If we do not reach you, we will leave a message.     If any biopsies were taken you will be contacted by phone or by letter within the next 1-3 weeks.  Please call us at [phone] if you have not heard about the biopsies in 3 weeks.    SIGNATURES/CONFIDENTIALITY: You and/or your care partner have signed paperwork which will be entered into your electronic medical record.  These signatures attest to the fact that that the information above on your After Visit Summary has been reviewed and is understood.  Full responsibility of the confidentiality of this discharge information lies with you and/or your care-partner.

## 2022-06-04 ENCOUNTER — Telehealth: Payer: Self-pay

## 2022-06-04 NOTE — Telephone Encounter (Signed)
  Follow up Call-     06/01/2022    7:13 AM  Call back number  Post procedure Call Back phone  # [phone]  Permission to leave phone message Yes     Patient questions:  Do you have a fever, pain , or abdominal swelling? No. Pain Score  0 *  Have you tolerated food without any problems? Yes.    Have you been able to return to your normal activities? Yes.    Do you have any questions about your discharge instructions: Diet   No. Medications  No. Follow up visit  No.  Do you have questions or concerns about your Care? No.  Actions: * If pain score is 4 or above: No action needed, pain <4.

## 2022-06-11 NOTE — Progress Notes (Signed)
Summer Lopez,  Good news: the polyp that I removed during your recent examination was NOT precancerous.  However, due to the suboptimal bowel prep, I recommend you repeat a colonoscopy in 3 years.  As discussed, a 2-day bowel prep may be needed to achieve an adequate prep.    If you develop any new rectal bleeding, abdominal pain or significant bowel habit changes, please contact me before then.

## 2022-09-28 ENCOUNTER — Other Ambulatory Visit: Payer: Self-pay | Admitting: Nurse Practitioner

## 2022-09-28 DIAGNOSIS — G894 Chronic pain syndrome: Secondary | ICD-10-CM

## 2022-10-03 NOTE — Discharge Instructions (Signed)

## 2022-10-04 ENCOUNTER — Inpatient Hospital Stay
Admission: RE | Admit: 2022-10-04 | Discharge: 2022-10-04 | Disposition: A | Discharge status: Home / Self Care | Payer: BC Managed Care – PPO | Source: Ambulatory Visit | Attending: Nurse Practitioner | Admitting: Nurse Practitioner

## 2022-10-11 NOTE — Discharge Instructions (Signed)

## 2022-10-12 ENCOUNTER — Ambulatory Visit
Admission: RE | Admit: 2022-10-12 | Discharge: 2022-10-12 | Disposition: A | Discharge status: Home / Self Care | Payer: BC Managed Care – PPO | Source: Ambulatory Visit | Attending: Nurse Practitioner | Admitting: Nurse Practitioner

## 2022-10-12 DIAGNOSIS — G894 Chronic pain syndrome: Secondary | ICD-10-CM

## 2022-10-12 MED ORDER — METHYLPREDNISOLONE ACETATE 40 MG/ML INJ SUSP (RADIOLOG
80.0000 mg | Freq: Once | INTRAMUSCULAR | Status: AC
  Administered 2022-10-12: 80 mg via EPIDURAL

## 2022-10-12 MED ORDER — IOPAMIDOL (ISOVUE-M 200) INJECTION 41%
1.0000 mL | Freq: Once | INTRAMUSCULAR | Status: AC
  Administered 2022-10-12: 1 mL via EPIDURAL

## 2023-04-30 ENCOUNTER — Other Ambulatory Visit: Payer: Self-pay | Admitting: Nurse Practitioner

## 2023-04-30 DIAGNOSIS — Z1231 Encounter for screening mammogram for malignant neoplasm of breast: Secondary | ICD-10-CM

## 2023-05-22 ENCOUNTER — Encounter: Payer: Self-pay | Admitting: Gastroenterology

## 2023-05-24 ENCOUNTER — Ambulatory Visit
Admission: RE | Admit: 2023-05-24 | Discharge: 2023-05-24 | Disposition: A | Discharge status: Home / Self Care | Source: Ambulatory Visit | Attending: Nurse Practitioner | Admitting: Nurse Practitioner

## 2023-05-24 DIAGNOSIS — Z1231 Encounter for screening mammogram for malignant neoplasm of breast: Secondary | ICD-10-CM

## 2023-07-17 ENCOUNTER — Other Ambulatory Visit: Payer: Self-pay | Admitting: Nurse Practitioner

## 2023-07-17 DIAGNOSIS — G894 Chronic pain syndrome: Secondary | ICD-10-CM

## 2023-07-17 DIAGNOSIS — M5126 Other intervertebral disc displacement, lumbar region: Secondary | ICD-10-CM

## 2023-07-26 ENCOUNTER — Encounter: Payer: Self-pay | Admitting: Nurse Practitioner

## 2023-07-26 NOTE — Discharge Instructions (Signed)

## 2023-07-29 ENCOUNTER — Ambulatory Visit
Admission: RE | Admit: 2023-07-29 | Discharge: 2023-07-29 | Disposition: A | Discharge status: Home / Self Care | Source: Ambulatory Visit | Attending: Nurse Practitioner | Admitting: Nurse Practitioner

## 2023-07-29 DIAGNOSIS — M5126 Other intervertebral disc displacement, lumbar region: Secondary | ICD-10-CM

## 2023-07-29 DIAGNOSIS — G894 Chronic pain syndrome: Secondary | ICD-10-CM

## 2023-07-29 MED ORDER — IOPAMIDOL (ISOVUE-M 200) INJECTION 41%
1.0000 mL | Freq: Once | INTRAMUSCULAR | Status: AC
  Administered 2023-07-29: 1 mL via EPIDURAL

## 2023-07-29 MED ORDER — METHYLPREDNISOLONE ACETATE 40 MG/ML INJ SUSP (RADIOLOG
80.0000 mg | Freq: Once | INTRAMUSCULAR | Status: AC
  Administered 2023-07-29: 80 mg via EPIDURAL

## 2023-12-16 ENCOUNTER — Other Ambulatory Visit: Payer: Self-pay | Admitting: Nurse Practitioner

## 2023-12-16 DIAGNOSIS — M4310 Spondylolisthesis, site unspecified: Secondary | ICD-10-CM

## 2023-12-16 DIAGNOSIS — G894 Chronic pain syndrome: Secondary | ICD-10-CM

## 2023-12-16 DIAGNOSIS — M5126 Other intervertebral disc displacement, lumbar region: Secondary | ICD-10-CM

## 2023-12-31 NOTE — Discharge Instructions (Signed)

## 2024-01-01 ENCOUNTER — Inpatient Hospital Stay
Admission: RE | Admit: 2024-01-01 | Discharge: 2024-01-01 | Disposition: A | Source: Ambulatory Visit | Attending: Nurse Practitioner | Admitting: Nurse Practitioner

## 2024-01-01 DIAGNOSIS — M5126 Other intervertebral disc displacement, lumbar region: Secondary | ICD-10-CM

## 2024-01-01 DIAGNOSIS — M4310 Spondylolisthesis, site unspecified: Secondary | ICD-10-CM

## 2024-01-01 DIAGNOSIS — G894 Chronic pain syndrome: Secondary | ICD-10-CM

## 2024-01-01 MED ORDER — IOPAMIDOL (ISOVUE-M 200) INJECTION 41%
1.0000 mL | Freq: Once | INTRAMUSCULAR | Status: AC
  Administered 2024-01-01: 1 mL via EPIDURAL

## 2024-01-01 MED ORDER — METHYLPREDNISOLONE ACETATE 40 MG/ML INJ SUSP (RADIOLOG
80.0000 mg | Freq: Once | INTRAMUSCULAR | Status: AC
  Administered 2024-01-01: 80 mg via EPIDURAL
